# Patient Record
Sex: Female | Born: 1975 | Race: Black or African American | Hispanic: No | Marital: Single | Smoking: Never smoker
Health system: Southern US, Community
[De-identification: ages and names within clinical notes are randomized; demographics above are authoritative.]

## PROBLEM LIST (undated history)

## (undated) DIAGNOSIS — I509 Heart failure, unspecified: Secondary | ICD-10-CM

## (undated) DIAGNOSIS — D649 Anemia, unspecified: Secondary | ICD-10-CM

## (undated) DIAGNOSIS — I10 Essential (primary) hypertension: Secondary | ICD-10-CM

## (undated) HISTORY — DX: Essential (primary) hypertension: I10

## (undated) HISTORY — DX: Heart failure, unspecified: I50.9

## (undated) HISTORY — DX: Anemia, unspecified: D64.9

---

## 1997-07-15 ENCOUNTER — Other Ambulatory Visit: Admission: RE | Admit: 1997-07-15 | Discharge: 1997-07-15 | Payer: Self-pay | Admitting: Obstetrics & Gynecology

## 1997-07-15 ENCOUNTER — Encounter: Admission: RE | Admit: 1997-07-15 | Discharge: 1997-07-15 | Payer: Self-pay | Admitting: Obstetrics & Gynecology

## 1999-11-24 ENCOUNTER — Ambulatory Visit (HOSPITAL_COMMUNITY): Admission: RE | Admit: 1999-11-24 | Discharge: 1999-11-24 | Payer: Self-pay | Admitting: *Deleted

## 2000-03-21 ENCOUNTER — Inpatient Hospital Stay (HOSPITAL_COMMUNITY): Admission: AD | Admit: 2000-03-21 | Discharge: 2000-03-21 | Payer: Self-pay | Admitting: *Deleted

## 2000-04-04 ENCOUNTER — Inpatient Hospital Stay (HOSPITAL_COMMUNITY): Admission: AD | Admit: 2000-04-04 | Discharge: 2000-04-04 | Payer: Self-pay | Admitting: *Deleted

## 2000-04-05 ENCOUNTER — Inpatient Hospital Stay (HOSPITAL_COMMUNITY): Admission: AD | Admit: 2000-04-05 | Discharge: 2000-04-05 | Payer: Self-pay | Admitting: *Deleted

## 2000-04-11 ENCOUNTER — Inpatient Hospital Stay (HOSPITAL_COMMUNITY): Admission: AD | Admit: 2000-04-11 | Discharge: 2000-04-11 | Payer: Self-pay | Admitting: Obstetrics

## 2000-04-19 ENCOUNTER — Inpatient Hospital Stay (HOSPITAL_COMMUNITY): Admission: AD | Admit: 2000-04-19 | Discharge: 2000-04-19 | Payer: Self-pay | Admitting: Obstetrics

## 2000-04-19 ENCOUNTER — Inpatient Hospital Stay (HOSPITAL_COMMUNITY): Admission: AD | Admit: 2000-04-19 | Discharge: 2000-04-19 | Payer: Self-pay | Admitting: *Deleted

## 2000-04-20 ENCOUNTER — Inpatient Hospital Stay (HOSPITAL_COMMUNITY): Admission: AD | Admit: 2000-04-20 | Discharge: 2000-04-25 | Payer: Self-pay | Admitting: Obstetrics & Gynecology

## 2010-08-26 ENCOUNTER — Emergency Department (HOSPITAL_COMMUNITY)
Admission: EM | Admit: 2010-08-26 | Discharge: 2010-08-26 | Disposition: A | Discharge status: Home / Self Care | Payer: Medicaid Other | Attending: Emergency Medicine | Admitting: Emergency Medicine

## 2010-08-26 DIAGNOSIS — K625 Hemorrhage of anus and rectum: Secondary | ICD-10-CM | POA: Insufficient documentation

## 2010-08-26 DIAGNOSIS — R1032 Left lower quadrant pain: Secondary | ICD-10-CM | POA: Insufficient documentation

## 2010-08-26 DIAGNOSIS — I10 Essential (primary) hypertension: Secondary | ICD-10-CM | POA: Insufficient documentation

## 2010-08-26 LAB — URINALYSIS, ROUTINE W REFLEX MICROSCOPIC
Bilirubin Urine: NEGATIVE
Glucose, UA: NEGATIVE mg/dL
Ketones, ur: NEGATIVE mg/dL
Leukocytes, UA: NEGATIVE
Nitrite: NEGATIVE
Protein, ur: NEGATIVE mg/dL
Specific Gravity, Urine: 1.021 (ref 1.005–1.030)
Urobilinogen, UA: 0.2 mg/dL (ref 0.0–1.0)
pH: 5.5 (ref 5.0–8.0)

## 2010-08-26 LAB — CBC
HCT: 34.2 % — ABNORMAL LOW (ref 36.0–46.0)
Hemoglobin: 11.3 g/dL — ABNORMAL LOW (ref 12.0–15.0)
MCH: 26 pg (ref 26.0–34.0)
MCHC: 33 g/dL (ref 30.0–36.0)
MCV: 78.6 fL (ref 78.0–100.0)
Platelets: 428 10*3/uL — ABNORMAL HIGH (ref 150–400)
RBC: 4.35 MIL/uL (ref 3.87–5.11)
RDW: 15.7 % — ABNORMAL HIGH (ref 11.5–15.5)
WBC: 9.7 10*3/uL (ref 4.0–10.5)

## 2010-08-26 LAB — POCT I-STAT, CHEM 8
BUN: 7 mg/dL (ref 6–23)
Calcium, Ion: 1.07 mmol/L — ABNORMAL LOW (ref 1.12–1.32)
Chloride: 103 mEq/L (ref 96–112)
Creatinine, Ser: 0.9 mg/dL (ref 0.4–1.2)
Glucose, Bld: 85 mg/dL (ref 70–99)
HCT: 37 % (ref 36.0–46.0)
Hemoglobin: 12.6 g/dL (ref 12.0–15.0)
Potassium: 2.9 mEq/L — ABNORMAL LOW (ref 3.5–5.1)
Sodium: 140 mEq/L (ref 135–145)
TCO2: 25 mmol/L (ref 0–100)

## 2010-08-26 LAB — URINE MICROSCOPIC-ADD ON

## 2010-08-26 LAB — OCCULT BLOOD, POC DEVICE: Fecal Occult Bld: NEGATIVE

## 2010-08-26 LAB — POCT PREGNANCY, URINE: Preg Test, Ur: NEGATIVE

## 2013-07-15 ENCOUNTER — Ambulatory Visit: Payer: Self-pay | Admitting: Podiatry

## 2013-08-12 ENCOUNTER — Ambulatory Visit: Payer: Self-pay | Admitting: Podiatry

## 2017-05-12 ENCOUNTER — Emergency Department (HOSPITAL_COMMUNITY)
Admission: EM | Admit: 2017-05-12 | Discharge: 2017-05-12 | Disposition: A | Discharge status: Home / Self Care | Payer: Medicaid Other | Attending: Emergency Medicine | Admitting: Emergency Medicine

## 2017-05-12 ENCOUNTER — Encounter (HOSPITAL_COMMUNITY): Payer: Self-pay | Admitting: Emergency Medicine

## 2017-05-12 ENCOUNTER — Other Ambulatory Visit: Payer: Self-pay

## 2017-05-12 DIAGNOSIS — Z659 Problem related to unspecified psychosocial circumstances: Secondary | ICD-10-CM | POA: Diagnosis not present

## 2017-05-12 DIAGNOSIS — D509 Iron deficiency anemia, unspecified: Secondary | ICD-10-CM | POA: Diagnosis present

## 2017-05-12 DIAGNOSIS — R5383 Other fatigue: Secondary | ICD-10-CM

## 2017-05-12 NOTE — ED Triage Notes (Addendum)
Pt verbalizes told by primary doctor that iron was low; recommended medication but pt declined; verbalizes "I do things the natural way." Pt continues to verbalize wanting resources for shelters.

## 2017-05-12 NOTE — Care Management Note (Signed)
Case Management Note  CM consulted for shelter resources.  Will defer to CSW.  No further CM needs noted at this time.

## 2017-05-12 NOTE — Progress Notes (Signed)
CSW met with patient via bedside to discuss current living situation/ discharge plans. Patient states she was previously staying at [facility] in [facility] for 2 months but recently left about 2 weeks about. Patient states she has been staying in [place] however has no permanent living arrangements. Patient stated she has been staying at [facility], hospitals, doctor offices, and multiple shelters. CSW provided patient with local shelter resources and informed patient of shelters with "white flag nights"- these shelters are able to accept more individuals if the temperature drops below 30 degrees. CSW also provided patient with information to the [facility] to help with long term plans. Patient requested bus pass for transportation.   Kingsley Spittle, [facility] Emergency Room Clinical Social Worker [phone]

## 2017-05-12 NOTE — Discharge Instructions (Signed)
Follow-up with your primary care provider regarding low iron, the treatment for this is typically iron supplements or an increase in iron intake in your diet.  If you develop blood in your stool, severe fatigue, weakness, chest pain, palpitations or shortness of breath please return to the emergency department.  You have been provided with a list of shelter resources, these call these places to attempt to find placement.

## 2017-05-12 NOTE — ED Notes (Signed)
ED Provider at bedside. 

## 2017-05-12 NOTE — ED Provider Notes (Signed)
[facility] DEPT Provider Note   CSN: [id number] Arrival date & time: 05/12/17  0546     History   Chief Complaint Chief Complaint  Patient presents with  . Abnormal Lab  . Community Resources    HPI  Kelly Guzman is a 42 y.o. Female who presents to the ED complaining of low iron and requesting resources for shelters. Pt contacted by her PCP this week and told she had low iron, patient denies symptoms with this, no chest pain, shortness of breath, palpitations, no GI bleeding, reports some intermittent weakness or fatigue, none today. Pt reports she is here to see social worker about shelters, but wanted her iron to be checked again while she was here.  Patient reports she has been told she has low iron in the past, does not take iron supplements, reports "she prefers to do things the natural way".  Patient requesting shelter resources, reports she was staying in a shelter and [facility] [employer] the left, and is trying to find a shelter here in [place], reports she was referred by someone to the social worker here in the emergency department.  Patient without any other acute complaints.      History reviewed. No pertinent past medical history.  There are no active problems to display for this patient.   History reviewed. No pertinent surgical history.  OB History    No data available       Home Medications    Prior to Admission medications   Not on File    Family History No family history on file.  Social History Social History   Tobacco Use  . Smoking status: Not on file  Substance Use Topics  . Alcohol use: Not on file  . Drug use: Not on file     Allergies   Patient has no known allergies.   Review of Systems Review of Systems  Constitutional: Positive for fatigue. Negative for chills and fever.  Respiratory: Negative for chest tightness and shortness of breath.   Cardiovascular: Negative for chest pain and  palpitations.  Gastrointestinal: Negative for abdominal pain, blood in stool, nausea and vomiting.  Skin: Negative for pallor.  Neurological: Negative for dizziness, weakness and light-headedness.     Physical Exam Updated Vital Signs BP (!) 166/89 (BP Location: Right Arm)   Pulse 85   Temp 97.7 F (36.5 C) (Oral)   Resp 16   SpO2 100%   Physical Exam  Constitutional: She appears well-developed and well-nourished. No distress.  HENT:  Head: Normocephalic and atraumatic.  Eyes: Right eye exhibits no discharge. Left eye exhibits no discharge.  Cardiovascular: Normal rate, regular rhythm and normal heart sounds.  Pulmonary/Chest: Effort normal and breath sounds normal. No stridor. No respiratory distress. She has no wheezes. She has no rales.  Neurological: She is alert. Coordination normal.  Skin: Skin is warm and dry. Capillary refill takes less than 2 seconds. She is not diaphoretic. No pallor.  Psychiatric: She has a normal mood and affect. Her behavior is normal.  Nursing note and vitals reviewed.    ED Treatments / Results  Labs (all labs ordered are listed, but only abnormal results are displayed) Labs Reviewed - No data to display  EKG  EKG Interpretation None       Radiology No results found.  Procedures Procedures (including critical care time)  Medications Ordered in ED Medications - No data to display   Initial Impression / Assessment and Plan / ED Course  I have reviewed the triage vital signs and the nursing notes.  Pertinent labs & imaging results that were available during my care of the patient were reviewed by me and considered in my medical decision making (see chart for details).  Patient presents reporting she was told she had low iron, initially retesting iron testing, patient is not actively symptomatic with this, denies any GI bleeding, gust with patient that with this is not a lab we typically order from the emergency department she  should follow-up with her primary care doctor regarding this, especially since labs were drawn just earlier this week, she is agreeable to this.  Pt is mildly hypertensive but vitals otherwise normal and patient is overall well-appearing.  Patient requested to see social worker for shelter resources, case management consult placed and patient provided with list of shelters here today.  CSW saw patient here in the ED provided shelter resources and bus pass.  At this time patient is stable for discharge home, she is to follow-up with her primary care doctor regarding iron deficiency as well as her elevated blood pressure today.  Return precautions discussed. At this time there does not appear to be any evidence of an acute emergency medical condition.  Final Clinical Impressions(s) / ED Diagnoses   Final diagnoses:  Iron deficiency anemia, unspecified iron deficiency anemia type  Fatigue, unspecified type  Social problem    ED Discharge Orders    None       Jacqlyn Larsen, [place] 05/12/17 8185    Sherwood Gambler, MD 05/12/17 1047

## 2018-07-13 ENCOUNTER — Telehealth: Payer: Self-pay | Admitting: Family

## 2018-07-13 NOTE — Telephone Encounter (Signed)
lmom for pt to return cal to office to sch new patient appt.. Mailed appt letter for 4/24 at 130 pm

## 2018-08-02 ENCOUNTER — Other Ambulatory Visit: Payer: Self-pay | Admitting: Family

## 2018-08-02 DIAGNOSIS — D649 Anemia, unspecified: Secondary | ICD-10-CM

## 2018-08-03 ENCOUNTER — Other Ambulatory Visit: Payer: Medicaid Other

## 2018-08-03 ENCOUNTER — Inpatient Hospital Stay: Payer: Medicaid Other | Attending: Family | Admitting: Family

## 2018-09-22 ENCOUNTER — Other Ambulatory Visit: Payer: Self-pay | Admitting: *Deleted

## 2018-09-22 DIAGNOSIS — Z20822 Contact with and (suspected) exposure to covid-19: Secondary | ICD-10-CM

## 2018-09-24 NOTE — Addendum Note (Signed)
Addended by: Brigitte Pulse on: 09/24/2018 09:16 AM   Modules accepted: Orders

## 2020-07-08 ENCOUNTER — Telehealth: Payer: Self-pay | Admitting: Hematology and Oncology

## 2020-07-08 NOTE — Telephone Encounter (Signed)
Received a new pt referral from Dr. Jeanie Cooks for IDA. Ms. Kelly Guzman returned my call and has been scheduled to see Dr. Lorenso Courier on 4/4 at 1pm. Pt aware to arrive 20 minutes early.

## 2020-07-10 ENCOUNTER — Other Ambulatory Visit: Payer: Self-pay | Admitting: Internal Medicine

## 2020-07-12 LAB — CBC
HCT: 13.3 % — ABNORMAL LOW (ref 35.0–45.0)
Hemoglobin: 3 g/dL — CL (ref 11.7–15.5)
MCH: <15 pg — ABNORMAL LOW (ref 27.0–33.0)
MCHC: 22.6 g/dL — ABNORMAL LOW (ref 32.0–36.0)
MCV: 55 fL — ABNORMAL LOW (ref 80.0–100.0)
MPV: 9.3 fL (ref 7.5–12.5)
Platelets: 643 10*3/uL — ABNORMAL HIGH (ref 140–400)
RBC: 2.42 10*6/uL — ABNORMAL LOW (ref 3.80–5.10)
RDW: 23.7 % — ABNORMAL HIGH (ref 11.0–15.0)
WBC: 6.4 10*3/uL (ref 3.8–10.8)

## 2020-07-12 LAB — FERRITIN: Ferritin: 2 ng/mL — ABNORMAL LOW (ref 16–232)

## 2020-07-12 LAB — IRON, TOTAL/TOTAL IRON BINDING CAP
%SAT: 1 % (calc) — ABNORMAL LOW (ref 16–45)
Iron: <10 ug/dL — ABNORMAL LOW (ref 40–190)
TIBC: 504 mcg/dL (calc) — ABNORMAL HIGH (ref 250–450)

## 2020-07-12 LAB — SICKLE CELL SCREEN: Sickle Solubility Test - HGBRFX: NEGATIVE

## 2020-07-12 LAB — VITAMIN B12: Vitamin B-12: >2000 pg/mL — ABNORMAL HIGH (ref 200–1100)

## 2020-07-12 LAB — FOLATE: Folate: 13.2 ng/mL

## 2020-07-12 NOTE — Progress Notes (Signed)
[place] [phone]   [phone]  INITIAL CONSULT NOTE  Patient Care Team: Nolene Ebbs, MD as PCP - General (Internal Medicine)  Hematological/Oncological History # Iron Deficiency Anemia 2/2 to GYN Bleeding 04/26/2017: Hgb 4.8, Iron <10, ferritin 1, Iron sat 1%, TIBC 508 07/10/2020: WBC 6.4, Hgb 3.0, MCV 55, Plt 643 07/13/2020: establish care with Dr. Lorenso Courier   CHIEF COMPLAINTS/PURPOSE OF CONSULTATION:  " Iron Deficiency Anemia  "   HISTORY OF PRESENTING ILLNESS:  Kelly Guzman 45 y.o. female with medical history significant for hypertension, and allergic rhinitis who presents for evaluation of iron deficiency anemia.  On review of the previous records Ms. Ordway was [facility].  They provided with his labs from 2019 which showed hemoglobin 4.8, ferritin of 1 iron sat of 1%, TIBC of 508, and a total serum iron of less than 10.  On 07/11/2018 the patient was found to have a white blood cell count of 6.4, hemoglobin 3.0, MCV of 55, and a platelet count of 643.  Due to concern for this iron deficiency anemia the patient was referred to hematology for further evaluation and management.  On exam today Ms. Bey reports that she is "not sure if she is sleepy".  But that she does occasionally have some shortness of breath, particular when walking upstairs in her apartment complex.  She notes that she is not having any nausea, vomiting, or diarrhea, though she does have dark tarry like stools.  She did recently start p.o. iron pills but notes that the stools occurred periodically before that time.  She notes that she does have some soreness around her waist but otherwise no frank abdominal pain.  On further discussion she reports records that she needed a blood transfusion back in 2001 when she lost blood during her delivery.  She is been tried on numerous different types iron pills but is  always had issues with diarrhea and cramping as well as rash.  She notes that her menstrual cycles began becoming much more heavy in 2017.  She notes that most recently she can go through and soak a full pad in approximately 15 minutes time.  Her last menstrual cycle was last week.  She is only been on iron pills for the last 3 days as they were prescribed on 07/10/2020.  In regards to family history the patient notes no hematological disorders within the family.  Her mother has hypertension diabetes and her father has type 2 diabetes.  She has 1 child who is 59 years old otherwise healthy.  She is a never smoker never drinker and is not currently employed.  A full 10 point ROS is listed below.  MEDICAL HISTORY:  Past Medical History:  Diagnosis Date  . Anemia     SURGICAL HISTORY: History reviewed. No pertinent surgical history.  SOCIAL HISTORY: Social History   Socioeconomic History  . Marital status: Single    Spouse name: Not on file  . Number of children: 1  . Years of education: Not on file  . Highest education level: Not on file  Occupational History  . Not on file  Tobacco Use  . Smoking status: Never Smoker  . Smokeless tobacco: Never Used  Substance and Sexual Activity  . Alcohol use: Not on file  . Drug use: Not on file  . Sexual activity: Not on file  Other Topics Concern  . Not on file  Social History Narrative  .  Not on file   Social Determinants of Health   Financial Resource Strain: Not on file  Food Insecurity: Not on file  Transportation Needs: Not on file  Physical Activity: Not on file  Stress: Not on file  Social Connections: Not on file  Intimate Partner Violence: Not on file    FAMILY HISTORY: Family History  Problem Relation Age of Onset  . Diabetes Mother   . Hypertension Mother   . Diabetes Father     ALLERGIES:  is allergic to penicillins.  MEDICATIONS:  Current Outpatient Medications  Medication Sig Dispense Refill  . Iron-FA-B  Cmp-C-Biot-Probiotic (FUSION PLUS PO) Take 1 capsule by mouth daily.    . cetirizine (ZYRTEC) 10 MG tablet SMARTSIG:1 Tablet(s) By Mouth Every Evening PRN    . furosemide (LASIX) 20 MG tablet Take 20 mg by mouth daily as needed.     No current facility-administered medications for this visit.    REVIEW OF SYSTEMS:   Constitutional: ( - ) fevers, ( - )  chills , ( - ) night sweats Eyes: ( - ) blurriness of vision, ( - ) double vision, ( - ) watery eyes Ears, nose, mouth, throat, and face: ( - ) mucositis, ( - ) sore throat Respiratory: ( - ) cough, ( - ) dyspnea, ( - ) wheezes Cardiovascular: ( - ) palpitation, ( - ) chest discomfort, ( - ) lower extremity swelling Gastrointestinal:  ( - ) nausea, ( - ) heartburn, ( - ) change in bowel habits Skin: ( - ) abnormal skin rashes Lymphatics: ( - ) new lymphadenopathy, ( - ) easy bruising Neurological: ( - ) numbness, ( - ) tingling, ( - ) new weaknesses Behavioral/Psych: ( - ) mood change, ( - ) new changes  All other systems were reviewed with the patient and are negative.  PHYSICAL EXAMINATION:  Vitals:   07/13/20 1346  BP: 136/84  Pulse: (!) 123  Resp: 17  Temp: 97.9 F (36.6 C)  SpO2: 93%   Filed Weights   07/13/20 1346  Weight: 140 lb 1.6 oz (63.5 kg)    GENERAL: well appearing pale African-American female in NAD  SKIN: skin color, texture, turgor are normal, no rashes or significant lesions EYES: conjunctiva are pale and non-injected, sclera clear LUNGS: clear to auscultation and percussion with normal breathing effort HEART: regular rate & rhythm and no murmurs and no lower extremity edema Musculoskeletal: no cyanosis of digits and no clubbing  PSYCH: alert & oriented x 3, fluent speech NEURO: no focal motor/sensory deficits  LABORATORY DATA:  I have reviewed the data as listed CBC Latest Ref Rng & Units 07/13/2020 07/10/2020 08/26/2010  WBC 4.0 - 10.5 K/uL 7.9 6.4 -  Hemoglobin 12.0 - 15.0 g/dL PENDING 3.0(LL) 12.6   Hematocrit 36.0 - 46.0 % PENDING 13.3(L) 37.0  Platelets 150 - 400 K/uL 556(H) 643(H) -    CMP Latest Ref Rng & Units 08/26/2010  Glucose 70 - 99 mg/dL 85  BUN 6 - 23 mg/dL 7  Creatinine 0.4 - 1.2 mg/dL 0.90  Sodium 135 - 145 mEq/L 140  Potassium 3.5 - 5.1 mEq/L 2.9(L)  Chloride 96 - 112 mEq/L 103    RADIOGRAPHIC STUDIES: I have personally reviewed the radiological images as listed and agreed with the findings in the report. No results found.  ASSESSMENT & PLAN Cherylin Palm 45 y.o. female with medical history significant for hypertension, and allergic rhinitis who presents for evaluation of iron deficiency anemia.  After review  the labs, the records, schedule the patient the findings most consistent with a severe iron deficiency anemia likely secondary to GYN bleeding with potential GI component given her history of dark stools.  At this time we are recommending that the patient be admitted to the [facility] for emergent transfusion support and beginning evaluation of her severe iron deficiency anemia.  # Iron Deficiency Anemia 2/2 to GYN Bleeding # Possible GI Bleeding --Findings are concerning for an extraordinarily low hemoglobin of 3.0 with clear marked iron deficiency anemia. --When this was uncovered on 07/10/2020 the patient was referred to the emergency department but declined to go at that time. --She is agreeable to be admitted to [facility] long hospital day for urgent transfusions -Target hemoglobin greater than 8.0 at time of discharge.  Recommend by starting with 2 units of packed red blood cells and reassessing. --Encourage primary team to reach out to GI for consult.  More likely to represent GYN bleeding, however given her history of dark stools I would like this to be looked into further --We will plan to set the patient up for IV iron infusions in the outpatient setting when she is discharged from the hospital with close follow-up.  No orders of the  defined types were placed in this encounter.   All questions were answered. The patient knows to call the clinic with any problems, questions or concerns.  A total of more than 60 minutes were spent on this encounter and over half of that time was spent on counseling and coordination of care as outlined above.   Ledell Peoples, MD Department of Hematology/Oncology [place] at [facility] Phone: [phone] Pager: [phone] Email: [email or web address]  07/13/2020 3:37 PM

## 2020-07-13 ENCOUNTER — Encounter: Payer: Self-pay | Admitting: Hematology and Oncology

## 2020-07-13 ENCOUNTER — Other Ambulatory Visit: Payer: Self-pay | Admitting: *Deleted

## 2020-07-13 ENCOUNTER — Inpatient Hospital Stay: Payer: Medicaid Other | Attending: Hematology and Oncology | Admitting: Hematology and Oncology

## 2020-07-13 ENCOUNTER — Inpatient Hospital Stay (HOSPITAL_COMMUNITY)
Admission: AD | Admit: 2020-07-13 | Discharge: 2020-07-18 | DRG: 811 | Disposition: A | Discharge status: Home / Self Care | Payer: Medicaid Other | Source: Ambulatory Visit | Attending: Internal Medicine | Admitting: Internal Medicine

## 2020-07-13 ENCOUNTER — Other Ambulatory Visit: Payer: Self-pay

## 2020-07-13 ENCOUNTER — Encounter (HOSPITAL_COMMUNITY): Payer: Self-pay | Admitting: Internal Medicine

## 2020-07-13 ENCOUNTER — Inpatient Hospital Stay: Payer: Medicaid Other

## 2020-07-13 VITALS — BP 136/84 | HR 123 | Temp 97.9°F | Resp 17 | Wt 140.1 lb

## 2020-07-13 DIAGNOSIS — Z79899 Other long term (current) drug therapy: Secondary | ICD-10-CM

## 2020-07-13 DIAGNOSIS — I517 Cardiomegaly: Secondary | ICD-10-CM | POA: Diagnosis not present

## 2020-07-13 DIAGNOSIS — D649 Anemia, unspecified: Secondary | ICD-10-CM | POA: Diagnosis present

## 2020-07-13 DIAGNOSIS — Z8249 Family history of ischemic heart disease and other diseases of the circulatory system: Secondary | ICD-10-CM

## 2020-07-13 DIAGNOSIS — I110 Hypertensive heart disease with heart failure: Secondary | ICD-10-CM | POA: Diagnosis present

## 2020-07-13 DIAGNOSIS — I313 Pericardial effusion (noninflammatory): Secondary | ICD-10-CM | POA: Diagnosis present

## 2020-07-13 DIAGNOSIS — D259 Leiomyoma of uterus, unspecified: Secondary | ICD-10-CM | POA: Diagnosis present

## 2020-07-13 DIAGNOSIS — I5082 Biventricular heart failure: Secondary | ICD-10-CM | POA: Diagnosis present

## 2020-07-13 DIAGNOSIS — R0600 Dyspnea, unspecified: Secondary | ICD-10-CM

## 2020-07-13 DIAGNOSIS — R600 Localized edema: Secondary | ICD-10-CM | POA: Diagnosis present

## 2020-07-13 DIAGNOSIS — Z20822 Contact with and (suspected) exposure to covid-19: Secondary | ICD-10-CM | POA: Diagnosis present

## 2020-07-13 DIAGNOSIS — I340 Nonrheumatic mitral (valve) insufficiency: Secondary | ICD-10-CM | POA: Diagnosis not present

## 2020-07-13 DIAGNOSIS — N83201 Unspecified ovarian cyst, right side: Secondary | ICD-10-CM | POA: Diagnosis present

## 2020-07-13 DIAGNOSIS — I081 Rheumatic disorders of both mitral and tricuspid valves: Secondary | ICD-10-CM | POA: Diagnosis present

## 2020-07-13 DIAGNOSIS — R160 Hepatomegaly, not elsewhere classified: Secondary | ICD-10-CM | POA: Diagnosis present

## 2020-07-13 DIAGNOSIS — D500 Iron deficiency anemia secondary to blood loss (chronic): Secondary | ICD-10-CM | POA: Diagnosis present

## 2020-07-13 DIAGNOSIS — I5043 Acute on chronic combined systolic (congestive) and diastolic (congestive) heart failure: Secondary | ICD-10-CM | POA: Diagnosis present

## 2020-07-13 DIAGNOSIS — Z880 Allergy status to penicillin: Secondary | ICD-10-CM

## 2020-07-13 DIAGNOSIS — E611 Iron deficiency: Secondary | ICD-10-CM | POA: Diagnosis present

## 2020-07-13 DIAGNOSIS — R000 Tachycardia, unspecified: Secondary | ICD-10-CM | POA: Diagnosis present

## 2020-07-13 DIAGNOSIS — I5032 Chronic diastolic (congestive) heart failure: Secondary | ICD-10-CM

## 2020-07-13 DIAGNOSIS — D509 Iron deficiency anemia, unspecified: Secondary | ICD-10-CM | POA: Insufficient documentation

## 2020-07-13 DIAGNOSIS — E876 Hypokalemia: Secondary | ICD-10-CM | POA: Diagnosis present

## 2020-07-13 DIAGNOSIS — N920 Excessive and frequent menstruation with regular cycle: Secondary | ICD-10-CM | POA: Diagnosis present

## 2020-07-13 DIAGNOSIS — R252 Cramp and spasm: Secondary | ICD-10-CM | POA: Diagnosis not present

## 2020-07-13 DIAGNOSIS — I361 Nonrheumatic tricuspid (valve) insufficiency: Secondary | ICD-10-CM | POA: Diagnosis not present

## 2020-07-13 DIAGNOSIS — I10 Essential (primary) hypertension: Secondary | ICD-10-CM | POA: Insufficient documentation

## 2020-07-13 LAB — CBC WITH DIFFERENTIAL (CANCER CENTER ONLY)
Abs Immature Granulocytes: 0.07 10*3/uL (ref 0.00–0.07)
Basophils Absolute: 0 10*3/uL (ref 0.0–0.1)
Basophils Relative: 0 %
Eosinophils Absolute: 0 10*3/uL (ref 0.0–0.5)
Eosinophils Relative: 0 %
HCT: 14 % — ABNORMAL LOW (ref 36.0–46.0)
Hemoglobin: 3.5 g/dL — CL (ref 12.0–15.0)
Immature Granulocytes: 1 %
Lymphocytes Relative: 22 %
Lymphs Abs: 1.7 10*3/uL (ref 0.7–4.0)
MCH: 13.7 pg — ABNORMAL LOW (ref 26.0–34.0)
MCHC: 25 g/dL — ABNORMAL LOW (ref 30.0–36.0)
MCV: 54.7 fL — ABNORMAL LOW (ref 80.0–100.0)
Monocytes Absolute: 0.5 10*3/uL (ref 0.1–1.0)
Monocytes Relative: 7 %
Neutro Abs: 5.5 10*3/uL (ref 1.7–7.7)
Neutrophils Relative %: 70 %
Platelet Count: 556 10*3/uL — ABNORMAL HIGH (ref 150–400)
RBC: 2.56 MIL/uL — ABNORMAL LOW (ref 3.87–5.11)
RDW: 26 % — ABNORMAL HIGH (ref 11.5–15.5)
WBC Count: 7.9 10*3/uL (ref 4.0–10.5)
nRBC: 1.8 % — ABNORMAL HIGH (ref 0.0–0.2)

## 2020-07-13 LAB — URINALYSIS, ROUTINE W REFLEX MICROSCOPIC
Bacteria, UA: NONE SEEN
Bilirubin Urine: NEGATIVE
Glucose, UA: NEGATIVE mg/dL
Hgb urine dipstick: NEGATIVE
Ketones, ur: NEGATIVE mg/dL
Leukocytes,Ua: NEGATIVE
Nitrite: NEGATIVE
Protein, ur: 30 mg/dL — AB
Specific Gravity, Urine: 1.013 (ref 1.005–1.030)
pH: 6 (ref 5.0–8.0)

## 2020-07-13 LAB — CMP (CANCER CENTER ONLY)
ALT: 24 U/L (ref 0–44)
AST: 26 U/L (ref 15–41)
Albumin: 3.5 g/dL (ref 3.5–5.0)
Alkaline Phosphatase: 216 U/L — ABNORMAL HIGH (ref 38–126)
Anion gap: 13 (ref 5–15)
BUN: 8 mg/dL (ref 6–20)
CO2: 20 mmol/L — ABNORMAL LOW (ref 22–32)
Calcium: 8.1 mg/dL — ABNORMAL LOW (ref 8.9–10.3)
Chloride: 109 mmol/L (ref 98–111)
Creatinine: 0.64 mg/dL (ref 0.44–1.00)
GFR, Estimated: >60 mL/min (ref >60)
Glucose, Bld: 104 mg/dL — ABNORMAL HIGH (ref 70–99)
Potassium: 3 mmol/L — ABNORMAL LOW (ref 3.5–5.1)
Sodium: 142 mmol/L (ref 135–145)
Total Bilirubin: 0.9 mg/dL (ref 0.3–1.2)
Total Protein: 7.1 g/dL (ref 6.5–8.1)

## 2020-07-13 LAB — RESP PANEL BY RT-PCR (FLU A&B, COVID) ARPGX2
Influenza A by PCR: NEGATIVE
Influenza B by PCR: NEGATIVE
SARS Coronavirus 2 by RT PCR: NEGATIVE

## 2020-07-13 LAB — APTT: aPTT: 34 seconds (ref 24–36)

## 2020-07-13 LAB — SAMPLE TO BLOOD BANK

## 2020-07-13 LAB — TSH: TSH: 2.782 u[IU]/mL (ref 0.350–4.500)

## 2020-07-13 LAB — ABO/RH: ABO/RH(D): A POS

## 2020-07-13 LAB — PROTIME-INR
INR: 1.4 — ABNORMAL HIGH (ref 0.8–1.2)
Prothrombin Time: 17 seconds — ABNORMAL HIGH (ref 11.4–15.2)

## 2020-07-13 LAB — PREGNANCY, URINE: Preg Test, Ur: NEGATIVE

## 2020-07-13 LAB — LACTATE DEHYDROGENASE: LDH: 169 U/L (ref 98–192)

## 2020-07-13 MED ORDER — POTASSIUM CHLORIDE CRYS ER 20 MEQ PO TBCR
40.0000 meq | EXTENDED_RELEASE_TABLET | Freq: Once | ORAL | Status: AC
  Administered 2020-07-13: 40 meq via ORAL
  Filled 2020-07-13: qty 2

## 2020-07-13 MED ORDER — SODIUM CHLORIDE 0.9 % IV SOLN: INTRAVENOUS | Status: DC

## 2020-07-13 MED ORDER — FUROSEMIDE 10 MG/ML IJ SOLN
20.0000 mg | Freq: Once | INTRAMUSCULAR | Status: AC
Start: 2020-07-13 — End: 2020-07-13
  Administered 2020-07-13: 20 mg via INTRAVENOUS

## 2020-07-13 MED ORDER — IOHEXOL 9 MG/ML PO SOLN
ORAL | Status: AC
  Administered 2020-07-13: 1000 mL via ORAL
  Filled 2020-07-13: qty 1000

## 2020-07-13 MED ORDER — ONDANSETRON HCL 4 MG PO TABS: 4.0000 mg | ORAL_TABLET | Freq: Four times a day (QID) | ORAL | Status: DC | PRN

## 2020-07-13 MED ORDER — PANTOPRAZOLE SODIUM 40 MG IV SOLR
40.0000 mg | Freq: Two times a day (BID) | INTRAVENOUS | Status: DC
  Administered 2020-07-13 – 2020-07-17 (×7): 40 mg via INTRAVENOUS
  Filled 2020-07-13 (×8): qty 40

## 2020-07-13 MED ORDER — FUROSEMIDE 10 MG/ML IJ SOLN
20.0000 mg | Freq: Once | INTRAMUSCULAR | Status: AC
  Administered 2020-07-14: 20 mg via INTRAVENOUS
  Filled 2020-07-13: qty 2

## 2020-07-13 MED ORDER — SODIUM CHLORIDE 0.9% IV SOLUTION: Freq: Once | INTRAVENOUS | Status: DC

## 2020-07-13 MED ORDER — LIP MEDEX EX OINT
TOPICAL_OINTMENT | CUTANEOUS | Status: DC | PRN
  Filled 2020-07-13: qty 7

## 2020-07-13 MED ORDER — ACETAMINOPHEN 325 MG PO TABS: 650.0000 mg | ORAL_TABLET | Freq: Four times a day (QID) | ORAL | Status: DC | PRN

## 2020-07-13 MED ORDER — ACETAMINOPHEN 650 MG RE SUPP: 650.0000 mg | Freq: Four times a day (QID) | RECTAL | Status: DC | PRN

## 2020-07-13 MED ORDER — ONDANSETRON HCL 4 MG/2ML IJ SOLN: 4.0000 mg | Freq: Four times a day (QID) | INTRAMUSCULAR | Status: DC | PRN

## 2020-07-13 MED ORDER — IOHEXOL 9 MG/ML PO SOLN: 1000.0000 mL | ORAL | Status: AC

## 2020-07-13 NOTE — Plan of Care (Signed)
Initiated care plan 

## 2020-07-13 NOTE — Progress Notes (Signed)
At 3:50 pm pt was transferred to room # 1402 vis wheelchair after report given to Denny Peon, RN via the phone. Pt stable, alert and oriented. She has been given some Sprite and soup and crackers while waiting for a bed assignment.  TCT pt's mother to advise her of her daughter's room #

## 2020-07-13 NOTE — H&P (Addendum)
[facility] History and Physical  Pamula Luther PJA:[id number] DOB: [birth year] DOA: 07/13/2020   PCP: Nolene Ebbs, MD  Specialists: Seen by Dr. Lorenso Courier at the cancer center today.  Chief Complaint: Fatigue, shortness of breath  HPI: Kelly Guzman is a 45 y.o. female with no significant past medical history.  Patient is somewhat of a poor historian.  She tells me that she has been dealing with anemia for a very long time possibly years.  She also seems to think that her hemoglobin has been as low as between 3 and 4 even a few months ago.  No labs available in our system from that time period.  Patient was found to have hemoglobin of 3.0 on 4/1.  This was upon routine blood work by her primary care provider.  She was called and asked to go to the emergency department which she refused.  She was subsequently made an appointment to see hematology today.  She was seen by Dr. Lorenso Courier at the [facility].  She was subsequently sent from there to the hospital for further management.  Patient seems to be providing conflicting information at times.  Apparently she mentioned to Dr. Lorenso Courier that she has had black stools for several days but she told me that she saw dark black stool only this morning and it was normal brown appearing yesterday.  She has been on iron tablets the last few days.  Denies any blood in the stool.  She has had heavy menstrual periods for several years.  She also mentions abdominal pain diffusely.  Some nausea but no vomiting.  Has had some difficulty urinating in the last few days and has had to strain.  Denies any burning sensation with urination.  Denies any weight loss per se but she has had a lot of swelling in her legs and has been on diuretics and so has noticed some fluctuation in her weight as a result of that.  Has noticed significant difficulty breathing with walking.  Also finds it difficult to lay flat in the bed at times due to difficulty breathing.   No fever no chills.  No recent illness otherwise.  She underwent blood work at the cancer center which showed hypokalemia, severe anemia.  LFTs were normal.  Patient was directly hospitalized for further management.  Home Medications: Prior to Admission medications   Medication Sig Start Date End Date Taking? Authorizing Provider  cetirizine (ZYRTEC) 10 MG tablet Take 10 mg by mouth daily. 07/10/20  Yes [provider]  furosemide (LASIX) 20 MG tablet Take 20 mg by mouth daily as needed for fluid. 07/10/20  Yes [provider]  Iron-FA-B Cmp-C-Biot-Probiotic (FUSION PLUS PO) Take 1 capsule by mouth daily.   Yes [provider]    Allergies:  Allergies  Allergen Reactions  . Penicillins Rash    Past Medical History: Past Medical History:  Diagnosis Date  . Anemia     No past surgical history on file.  Social History: She lives in [place].  Does not work.  No history of smoking alcohol use or illicit drug use.  Usually independent with daily activities.   Family History:  Family History  Problem Relation Age of Onset  . Diabetes Mother   . Hypertension Mother   . Diabetes Father      Review of Systems - General ROS: positive for  - fatigue and malaise Psychological ROS: positive for - anxiety Ophthalmic ROS: negative ENT ROS: negative Allergy and Immunology ROS: negative  Hematological and Lymphatic ROS: positive for - bleeding problems and fatigue Endocrine ROS: negative Respiratory ROS: As in HPI Cardiovascular ROS: As in HPI Gastrointestinal ROS: As in HPI Genito-Urinary ROS: As in HPI Musculoskeletal ROS: negative Neurological ROS: no TIA or stroke symptoms Dermatological ROS: negative  Physical Examination  Vitals:   07/13/20 1630  BP: (!) 143/80  Pulse: (!) 111  Resp: 16  Temp: 98.4 F (36.9 C)  TempSrc: Oral  SpO2: 100%    BP (!) 143/80 (BP Location: Left Arm)   Pulse (!) 111   Temp 98.4 F (36.9 C) (Oral)   Resp 16    SpO2 100%   General appearance: alert, cooperative, appears stated age and no distress Head: Normocephalic, without obvious abnormality, atraumatic Eyes: No icterus.  Pallor is noted.  Extraocular movement intact.  Pupils equal reactive. Throat: lips, mucosa, and tongue normal; teeth and gums normal Neck: no adenopathy, no carotid bruit, no JVD, supple, symmetrical, trachea midline and thyroid not enlarged, symmetric, no tenderness/mass/nodules Resp: clear to auscultation bilaterally Cardio: S1 is normal.  S2 is split.  Systolic murmur appreciated over the precordium.  No S3-S4. GI: Abdomen is soft.  Diffusely tender.  Fullness appreciated over the lower abdominal area.  Mild tenderness also appreciated in the right upper quadrant.  Liver edge is palpable.  Bowel sounds present. Extremities: 2+ edema bilateral lower extremities Pulses: 2+ and symmetric Skin: Skin color, texture, turgor normal. No rashes or lesions Lymph nodes: Cervical, supraclavicular, and axillary nodes normal. Neurologic: Alert and oriented x3.  Cranial nerves II through XII intact.  Motor strength equal bilateral upper and lower extremities.    Labs on Admission: I have personally reviewed following labs and imaging studies  CBC: Recent Labs  Lab 07/10/20 1203 07/13/20 1452  WBC 6.4 7.9  NEUTROABS  --  5.5  HGB 3.0* 3.5*  HCT 13.3* 14.0*  MCV 55.0* 54.7*  PLT 643* 202*   Basic Metabolic Panel: Recent Labs  Lab 07/13/20 1452  NA 142  K 3.0*  CL 109  CO2 20*  GLUCOSE 104*  BUN 8  CREATININE 0.64  CALCIUM 8.1*   GFR: CrCl cannot be calculated (Unknown ideal weight.). Liver Function Tests: Recent Labs  Lab 07/13/20 1452  AST 26  ALT 24  ALKPHOS 216*  BILITOT 0.9  PROT 7.1  ALBUMIN 3.5    Radiological Exams on Admission: CT AP is pending  My interpretation of Electrocardiogram: EKG is pending   Problem List  Active Problems:   Symptomatic anemia   Pedal edema   Tachycardia   Iron  deficiency   Menorrhagia   Assessment: This is a 45 year old African-American female who has been hospitalized for further evaluation and management of her severe anemia.  Patient has iron deficiency based on recent blood work.  Hemoglobin is extremely low at 3.5.  This appears to be mostly a chronic issue due to heavy menstruation.  Some concern for dark/black-colored stool.  She also has significant pedal edema.  She also has abdominal tenderness etiology of which is not entirely clear  Plan:  1. Symptomatic anemia/iron deficiency: Most likely due to heavy menstrual loss.  Iron level was less than 10 with a ferritin of 2.  Folic acid level normal.  B12 greater than 2000.  Hemoglobin 3.5 today.  We will start off by transfusing 3 units of PRBC.  She will benefit from iron infusion eventually.  May need more transfusions.  2.  Menorrhagia: Most likely reason for her anemia.  Has not been seen by gynecology in many years.  Do a urine pregnancy test.    3.  Abdominal tenderness: Etiology is unclear.  She also mentions some difficulty urinating recently.  Will check a UA.  At this time it is reasonable to proceed with CT scan of the abdomen pelvis which will be ordered.  4.  Black-colored stool: She mentioned to me that she noticed this only today.  She has been on iron tablets the last few days.  We will do FOBT.  Put her on PPI.  Discuss with gastroenterology in the morning.  No indication for urgent endoscopy at this time.  Denies NSAID use.  Denies any acid reflux.  5.  Pedal edema: Could be due to longstanding anemia.  Will order echocardiogram.  TSH will be checked.  6. Tachycardia: Appears to be regular.  Check EKG.  Likely due to severe anemia.  We will also check TSH.  7.  Hypokalemia: This will be repleted.  Check magnesium level.   DVT Prophylaxis: SCDs Code Status: Full code Family Communication: Discussed with the patient Disposition: Hopefully return home in improved Consults  called: We will discuss with gastroenterology in the morning. Hematology to follow in hospital. Admission Status: Status is: Inpatient  Remains inpatient appropriate because:IV treatments appropriate due to intensity of illness or inability to take PO and Inpatient level of care appropriate due to severity of illness   Dispo: The patient is from: Home              Anticipated d/c is to: Home              Patient currently is not medically stable to d/c.   Difficult to place patient No     Severity of Illness: The appropriate patient status for this patient is INPATIENT. Inpatient status is judged to be reasonable and necessary in order to provide the required intensity of service to ensure the patient's safety. The patient's presenting symptoms, physical exam findings, and initial radiographic and laboratory data in the context of their chronic comorbidities is felt to place them at high risk for further clinical deterioration. Furthermore, it is not anticipated that the patient will be medically stable for discharge from the hospital within 2 midnights of admission. The following factors support the patient status of inpatient.   " The patient's presenting symptoms include fatigue, shortness of breath. " The worrisome physical exam findings include pallor. " The initial radiographic and laboratory data are worrisome because of severe anemia. " The chronic co-morbidities include none.   * I certify that at the point of admission it is my clinical judgment that the patient will require inpatient hospital care spanning beyond 2 midnights from the point of admission due to high intensity of service, high risk for further deterioration and high frequency of surveillance required.*    Further management decisions will depend on results of further testing and patient's response to treatment.   Gresia Isidoro [employer]  [facility] operational officer on [email or web address]  07/13/2020, 5:37 PM

## 2020-07-13 NOTE — Progress Notes (Signed)
MD at bedside. Patient up in chair. Fran Lowes, RN VAST

## 2020-07-13 NOTE — Progress Notes (Signed)
Transported patient to lab via w/c   Lab is to call when she is done and the patient will be brought back to the clinic to wait while arrangements are made for admission.  Pt voices understanding

## 2020-07-14 ENCOUNTER — Inpatient Hospital Stay (HOSPITAL_COMMUNITY): Payer: Medicaid Other

## 2020-07-14 ENCOUNTER — Other Ambulatory Visit: Payer: Self-pay

## 2020-07-14 ENCOUNTER — Encounter (HOSPITAL_COMMUNITY): Payer: Self-pay | Admitting: Internal Medicine

## 2020-07-14 DIAGNOSIS — R0600 Dyspnea, unspecified: Secondary | ICD-10-CM | POA: Diagnosis not present

## 2020-07-14 DIAGNOSIS — R160 Hepatomegaly, not elsewhere classified: Secondary | ICD-10-CM

## 2020-07-14 DIAGNOSIS — I517 Cardiomegaly: Secondary | ICD-10-CM

## 2020-07-14 DIAGNOSIS — I361 Nonrheumatic tricuspid (valve) insufficiency: Secondary | ICD-10-CM

## 2020-07-14 DIAGNOSIS — I340 Nonrheumatic mitral (valve) insufficiency: Secondary | ICD-10-CM

## 2020-07-14 LAB — COMPREHENSIVE METABOLIC PANEL
ALT: 25 U/L (ref 0–44)
AST: 28 U/L (ref 15–41)
Albumin: 3.6 g/dL (ref 3.5–5.0)
Alkaline Phosphatase: 193 U/L — ABNORMAL HIGH (ref 38–126)
Anion gap: 9 (ref 5–15)
BUN: 7 mg/dL (ref 6–20)
CO2: 19 mmol/L — ABNORMAL LOW (ref 22–32)
Calcium: 8.3 mg/dL — ABNORMAL LOW (ref 8.9–10.3)
Chloride: 111 mmol/L (ref 98–111)
Creatinine, Ser: 0.42 mg/dL — ABNORMAL LOW (ref 0.44–1.00)
GFR, Estimated: >60 mL/min (ref >60)
Glucose, Bld: 96 mg/dL (ref 70–99)
Potassium: 3.4 mmol/L — ABNORMAL LOW (ref 3.5–5.1)
Sodium: 139 mmol/L (ref 135–145)
Total Bilirubin: 1.1 mg/dL (ref 0.3–1.2)
Total Protein: 7.3 g/dL (ref 6.5–8.1)

## 2020-07-14 LAB — CBC
HCT: 27.2 % — ABNORMAL LOW (ref 36.0–46.0)
Hemoglobin: 8.2 g/dL — ABNORMAL LOW (ref 12.0–15.0)
MCH: 20.2 pg — ABNORMAL LOW (ref 26.0–34.0)
MCHC: 30.1 g/dL (ref 30.0–36.0)
MCV: 67.2 fL — ABNORMAL LOW (ref 80.0–100.0)
Platelets: 465 10*3/uL — ABNORMAL HIGH (ref 150–400)
RBC: 4.05 MIL/uL (ref 3.87–5.11)
WBC: 8.9 10*3/uL (ref 4.0–10.5)
nRBC: 2.6 % — ABNORMAL HIGH (ref 0.0–0.2)

## 2020-07-14 LAB — ECHOCARDIOGRAM COMPLETE
Area-P 1/2: 4.96 cm2
Calc EF: 37.4 %
Height: 60 in
S' Lateral: 4 cm
Single Plane A2C EF: 34.6 %
Single Plane A4C EF: 38.5 %
Weight: 2241.28 oz

## 2020-07-14 LAB — HIV ANTIBODY (ROUTINE TESTING W REFLEX): HIV Screen 4th Generation wRfx: NONREACTIVE

## 2020-07-14 LAB — OCCULT BLOOD X 1 CARD TO LAB, STOOL: Fecal Occult Bld: NEGATIVE

## 2020-07-14 LAB — BRAIN NATRIURETIC PEPTIDE: B Natriuretic Peptide: 1133.9 pg/mL — ABNORMAL HIGH (ref 0.0–100.0)

## 2020-07-14 LAB — MAGNESIUM: Magnesium: 1.7 mg/dL (ref 1.7–2.4)

## 2020-07-14 LAB — PREPARE RBC (CROSSMATCH)

## 2020-07-14 MED ORDER — FUROSEMIDE 10 MG/ML IJ SOLN
60.0000 mg | Freq: Two times a day (BID) | INTRAMUSCULAR | Status: DC
  Administered 2020-07-14 – 2020-07-15 (×2): 60 mg via INTRAVENOUS
  Filled 2020-07-14 (×3): qty 6

## 2020-07-14 MED ORDER — POTASSIUM CHLORIDE CRYS ER 20 MEQ PO TBCR
40.0000 meq | EXTENDED_RELEASE_TABLET | Freq: Two times a day (BID) | ORAL | Status: AC
  Administered 2020-07-14 (×2): 40 meq via ORAL
  Filled 2020-07-14 (×2): qty 2

## 2020-07-14 MED ORDER — METOPROLOL TARTRATE 5 MG/5ML IV SOLN
5.0000 mg | Freq: Once | INTRAVENOUS | Status: AC
  Administered 2020-07-14: 5 mg via INTRAVENOUS
  Filled 2020-07-14: qty 5

## 2020-07-14 MED ORDER — IOHEXOL 300 MG/ML  SOLN
100.0000 mL | Freq: Once | INTRAMUSCULAR | Status: AC | PRN
  Administered 2020-07-14: 100 mL via INTRAVENOUS

## 2020-07-14 MED ORDER — CETIRIZINE HCL 10 MG PO TABS
10.0000 mg | ORAL_TABLET | Freq: Every day | ORAL | Status: DC
  Administered 2020-07-14 – 2020-07-18 (×5): 10 mg via ORAL
  Filled 2020-07-14 (×5): qty 1

## 2020-07-14 MED ORDER — FUROSEMIDE 10 MG/ML IJ SOLN
INTRAMUSCULAR | Status: AC
  Administered 2020-07-14: 40 mg via INTRAVENOUS
  Filled 2020-07-14: qty 2

## 2020-07-14 MED ORDER — METOPROLOL TARTRATE 5 MG/5ML IV SOLN: 2.5000 mg | Freq: Four times a day (QID) | INTRAVENOUS | Status: DC | PRN

## 2020-07-14 MED ORDER — FUROSEMIDE 10 MG/ML IJ SOLN
40.0000 mg | Freq: Once | INTRAMUSCULAR | Status: AC
  Filled 2020-07-14: qty 4

## 2020-07-14 MED ORDER — LORATADINE 10 MG PO TABS: 10.0000 mg | ORAL_TABLET | Freq: Every day | ORAL | Status: DC

## 2020-07-14 NOTE — Progress Notes (Addendum)
[facility] PROGRESS NOTE   Kelly Guzman HYQ:[id number] DOB: [birth year] DOA: 07/13/2020  PCP: Nolene Ebbs, MD  Brief History/Interval Summary: 45 year old African-American female with no significant past medical history except for longstanding anemia presented with hemoglobin of 3.5.  She had previously declined hospitalization.  Was seen by Dr. Lorenso Courier at the cancer clinic on 4/4 and was directly admitted.  Thought to be secondary to chronic loss from GYN issues.  Consultants: Hematology.  Procedures:  Transthoracic echocardiogram 4/5 1. Left ventricular ejection fraction, by estimation, is 35 to 40%. The  left ventricle has moderately decreased function. The left ventricle  demonstrates global hypokinesis. Indeterminate diastolic filling due to  E-A fusion. There is the  interventricular septum is flattened in diastole ('D' shaped left  ventricle), consistent with right ventricular volume overload.  2. Right ventricular systolic function is moderately reduced. The right  ventricular size is mildly enlarged. There is moderately elevated  pulmonary artery systolic pressure.  3. Left atrial size was mildly dilated.  4. Right atrial size was mild to moderately dilated.  5. The pericardial effusion is circumferential. Large pleural effusion in  the left lateral region.  6. The mitral valve is normal in structure. Mild to moderate mitral valve  regurgitation.  7. Tricuspid valve regurgitation is moderate to severe.  8. The aortic valve is tricuspid. Aortic valve regurgitation is not  visualized.  9. The inferior vena cava is dilated in size with <50% respiratory  variability, suggesting right atrial pressure of 15 mmHg.   Antibiotics: Anti-infectives (From admission, onward)   None      Subjective/Interval History: Patient continues to have fatigue, shortness of breath with exertion.  She also mentions that she is hungry.  Had a brown-colored stool  yesterday.  Has not had any bleeding episodes.  She is getting third unit of PRBC currently.    Assessment/Plan:  Symptomatic anemia/iron deficiency/chronic blood loss anemia Patient's anemia appears to be primarily longstanding and likely due to longstanding menorrhagia.  CT scan of the abdomen pelvis done yesterday showed uterine fibroids which could be the reason for her menorrhagia.  She will need to eventually see a GYN.  No active bleeding noted currently.  Patient was ordered 3 units of PRBC yesterday.  She is currently getting her third unit.  Wait for posttransfusion CBC.  Will like to keep her hemoglobin close to 7. Anemia panel done a few days ago showed a ferritin of 2, iron level less than 10.  Folic acid level was normal.  B12 level greater than 2000.  May benefit from iron infusion during this hospital stay.  Menorrhagia/uterine fibroids No active bleeding currently.  This has been a longstanding issue.  CT scan showed uterine fibroids.  She will need to see GYN eventually.  Urine pregnancy test was negative.  Concern for GI bleed Patient mention black-colored stool yesterday to hematology and to self.  She only had one episode of this and that was yesterday.  She had been on iron tablets for the last week.  Had a brown stool yesterday.  Fecal occult test was negative.  No clear indication at this time to involve gastroenterology.  Continue PPI for now.  Dyspnea/pedal edema/cardiomegaly/pleural effusion/small pericardial effusion Patient's dyspnea thought to be due to severe anemia.  However CT scan raises concern for cardiomegaly.  She is also noted to have moderate bilateral pleural effusion.  She has significant pedal edema bilaterally.  She is likely volume overloaded.  Stop IVF. She is getting  Lasix in between transfusions.   Echocardiogram is pending.  Will check EKG.  We will also check BNP.  Consider more doses of diuretics later today. Chest x-ray will also be ordered. TSH  was normal.  ADDENDUM ECHO shows low EF of 35-40%. RV function is reduced as well. Pericardial effusion noted. Discussed with Dr. Johney Frame with cardiology. They will consult. She reviewed ECHO films and thinks pericardial effusion is small. Will give additional Lasix tonight.   Abdominal discomfort/hepatomegaly CT scan of the abdomen pelvis did show small amount of free fluid in the abdomen and pelvis with mesenteric edema.  Thought to be reactive or related to liver disease.  She was also noted to have significant hepatomegaly.  Passive hepatic congestion could be the reason.  Her AST ALT were normal yesterday.  Viral etiology seems less likely.  Follow-up again on LFTs today. UA did not suggest infection.  LFTs were normal.  Pancreas looked unremarkable on CT scan.  Elevated blood pressure/sinus tachycardia Blood pressure noted to be in the hypertensive range.  No previous history of hypertension.  Follow-up on echocardiogram and labs and then will decide on treatment options.  Hypokalemia This was repleted yesterday.  Recheck labs.  Check magnesium.  Right adnexal cyst Incidentally noted on CT scan.  Outpatient management.  DVT Prophylaxis: SCDs Code Status: Full code Family Communication: Discussed with the patient Disposition Plan: Hopefully return home when improved  Status is: Inpatient  Remains inpatient appropriate because:Ongoing diagnostic testing needed not appropriate for outpatient work up, IV treatments appropriate due to intensity of illness or inability to take PO and Inpatient level of care appropriate due to severity of illness   Dispo:  Patient From: Home  Planned Disposition: Home  Medically stable for discharge: No        Medications:  Scheduled: . sodium chloride   Intravenous Once  . furosemide      . pantoprazole (PROTONIX) IV  40 mg Intravenous Q12H   Continuous: . sodium chloride 75 mL/hr at 07/13/20 1808   MLY:[id number] **OR**  acetaminophen, lip balm, ondansetron **OR** ondansetron (ZOFRAN) IV   Objective:  Vital Signs  Vitals:   07/14/20 0156 07/14/20 0219 07/14/20 0510 07/14/20 0534  BP: (!) 147/101 (!) 151/111 (!) 151/109 (!) 153/109  Pulse: (!) 108 (!) 109 (!) 106 (!) 102  Resp: (!) 21 (!) 21 (!) 21 (!) 21  Temp: 98.6 F (37 C) 98.4 F (36.9 C) 98.5 F (36.9 C) 97.9 F (36.6 C)  TempSrc: Oral Oral Oral Oral  SpO2: 100% 100% 100% 100%  Weight:      Height:        Intake/Output Summary (Last 24 hours) at 07/14/2020 0901 Last data filed at 07/14/2020 0507 Gross per 24 hour  Intake 1257.97 ml  Output --  Net 1257.97 ml   Filed Weights   07/13/20 2333  Weight: 63.5 kg    General appearance: Awake alert.  In no distress Resp: Diminished air entry at the bases.  No wheezing or rhonchi. Cardio: S1 is normal.  S2 may be split.  Possible S3.  Systolic murmur over the precordium.   GI: Abdomen is soft.  Hepatomegaly appreciated.  Mildly tender diffusely without any rebound rigidity or guarding. Extremities: 2+ edema bilateral lower extremities Neurologic: Alert and oriented x3.  No focal neurological deficits.    Lab Results:  Data Reviewed: I have personally reviewed following labs and imaging studies  CBC: Recent Labs  Lab 07/10/20 1203 07/13/20 1452  WBC 6.4  7.9  NEUTROABS  --  5.5  HGB 3.0* 3.5*  HCT 13.3* 14.0*  MCV 55.0* 54.7*  PLT 643* 556*    Basic Metabolic Panel: Recent Labs  Lab 07/13/20 1452  NA 142  K 3.0*  CL 109  CO2 20*  GLUCOSE 104*  BUN 8  CREATININE 0.64  CALCIUM 8.1*    GFR: Estimated Creatinine Clearance: 74.7 mL/min (by C-G formula based on SCr of 0.64 mg/dL).  Liver Function Tests: Recent Labs  Lab 07/13/20 1452  AST 26  ALT 24  ALKPHOS 216*  BILITOT 0.9  PROT 7.1  ALBUMIN 3.5     Coagulation Profile: Recent Labs  Lab 07/13/20 1756  INR 1.4*     Thyroid Function Tests: Recent Labs    07/13/20 1756  TSH 2.782      Recent  Results (from the past 240 hour(s))  Resp Panel by RT-PCR (Flu A&B, Covid) Nasopharyngeal Swab     Status: None   Collection Time: 07/13/20  5:27 PM   Specimen: Nasopharyngeal Swab; Nasopharyngeal(NP) swabs in vial transport medium  Result Value Ref Range Status   SARS Coronavirus 2 by RT PCR NEGATIVE NEGATIVE Final    Comment: (NOTE) SARS-CoV-2 target nucleic acids are NOT DETECTED.  The SARS-CoV-2 RNA is generally detectable in upper respiratory specimens during the acute phase of infection. The lowest concentration of SARS-CoV-2 viral copies this assay can detect is 138 copies/mL. A negative result does not preclude SARS-Cov-2 infection and should not be used as the sole basis for treatment or other patient management decisions. A negative result may occur with  improper specimen collection/handling, submission of specimen other than nasopharyngeal swab, presence of viral mutation(s) within the areas targeted by this assay, and inadequate number of viral copies(<138 copies/mL). A negative result must be combined with clinical observations, patient history, and epidemiological information. The expected result is Negative.  Fact Sheet for Patients:  EntrepreneurPulse.com.au  Fact Sheet for Healthcare Providers:  IncredibleEmployment.be  This test is no t yet approved or cleared by the [place] FDA and  has been authorized for detection and/or diagnosis of SARS-CoV-2 by FDA under an Emergency Use Authorization (EUA). This EUA will remain  in effect (meaning this test can be used) for the duration of the COVID-19 declaration under Section 564(b)(1) of the Act, 21 U.S.C.section 360bbb-3(b)(1), unless the authorization is terminated  or revoked sooner.       Influenza A by PCR NEGATIVE NEGATIVE Final   Influenza B by PCR NEGATIVE NEGATIVE Final    Comment: (NOTE) The Xpert Xpress SARS-CoV-2/FLU/RSV plus assay is intended as an aid in the  diagnosis of influenza from Nasopharyngeal swab specimens and should not be used as a sole basis for treatment. Nasal washings and aspirates are unacceptable for Xpert Xpress SARS-CoV-2/FLU/RSV testing.  Fact Sheet for Patients: EntrepreneurPulse.com.au  Fact Sheet for Healthcare Providers: IncredibleEmployment.be  This test is not yet approved or cleared by the [place] FDA and has been authorized for detection and/or diagnosis of SARS-CoV-2 by FDA under an Emergency Use Authorization (EUA). This EUA will remain in effect (meaning this test can be used) for the duration of the COVID-19 declaration under Section 564(b)(1) of the Act, 21 U.S.C. section 360bbb-3(b)(1), unless the authorization is terminated or revoked.  Performed at [facility], [place] [address] [place], [place] [address]       Radiology Studies: CT ABDOMEN PELVIS W CONTRAST  Result Date: 07/14/2020 CLINICAL DATA:  Nausea and diffuse abdominal pain.  EXAM: CT ABDOMEN AND PELVIS WITH CONTRAST TECHNIQUE: Multidetector CT imaging of the abdomen and pelvis was performed using the standard protocol following bolus administration of intravenous contrast. CONTRAST:  179mL OMNIPAQUE IOHEXOL 300 MG/ML  SOLN COMPARISON:  None. FINDINGS: Lower chest: Moderate bilateral pleural effusions with basilar atelectasis. Cardiac enlargement with small pericardial effusion. Hepatobiliary: Diffuse enlargement of the liver. Heterogeneous poorly defined low-attenuation throughout the liver. Changes may represent a combination of passive hepatic congestion and heterogeneous fatty infiltration. Pericholecystic fluid is nonspecific but likely related to liver disease. No bile duct dilatation. Pancreas: Unremarkable. No pancreatic ductal dilatation or surrounding inflammatory changes. Spleen: Normal in size without focal abnormality. Adrenals/Urinary Tract: Adrenal glands are unremarkable.  Kidneys are normal, without renal calculi, focal lesion, or hydronephrosis. Bladder is unremarkable. Stomach/Bowel: Stomach, small bowel, and colon are not abnormally distended. No wall thickening or inflammatory changes are appreciated. Appendix is normal. Vascular/Lymphatic: No significant vascular findings are present. No enlarged abdominal or pelvic lymph nodes. Reproductive: Heterogeneous nodular enlargement of the uterus consistent with uterine fibroids. Largest measures 3.8 cm diameter. Right adnexal cyst measuring 5 cm diameter. Other: Small amount of free fluid in the abdomen and pelvis with mesenteric edema, likely reactive or related to liver disease. Musculoskeletal: No acute or significant osseous findings. IMPRESSION: 1. Diffuse enlargement of the liver with poorly defined low-attenuation changes, likely to represent a combination of passive hepatic congestion and heterogeneous fatty infiltration. Hepatitis would also be a possibility. 2. Moderate bilateral pleural effusions with basilar atelectasis. 3. Cardiac enlargement with small pericardial effusion. 4. Small amount of free fluid in the abdomen and pelvis with mesenteric edema, likely reactive or related to liver disease. 5. Uterine fibroids. 6. Right adnexal cyst measuring 5 cm diameter. No follow-up imaging recommended. Note: This recommendation does not apply to premenarchal patients and to those with increased risk (genetic, family history, elevated tumor markers or other high-risk factors) of ovarian cancer. Reference: JACR 2020 Feb; 17(2):248-254 Electronically Signed   By: Lucienne Capers M.D.   On: 07/14/2020 02:03       LOS: 1 day   [facility] Pager on [email or web address]  07/14/2020, 9:01 AM

## 2020-07-14 NOTE — Progress Notes (Signed)
  Echocardiogram 2D Echocardiogram has been performed.  Jennette Dubin 07/14/2020, 2:50 PM

## 2020-07-14 NOTE — Plan of Care (Signed)

## 2020-07-15 ENCOUNTER — Encounter (HOSPITAL_COMMUNITY): Payer: Self-pay | Admitting: Internal Medicine

## 2020-07-15 DIAGNOSIS — I5043 Acute on chronic combined systolic (congestive) and diastolic (congestive) heart failure: Secondary | ICD-10-CM | POA: Diagnosis not present

## 2020-07-15 DIAGNOSIS — R000 Tachycardia, unspecified: Secondary | ICD-10-CM

## 2020-07-15 DIAGNOSIS — E611 Iron deficiency: Secondary | ICD-10-CM

## 2020-07-15 LAB — TYPE AND SCREEN
ABO/RH(D): A POS
Antibody Screen: NEGATIVE
Unit division: 0
Unit division: 0
Unit division: 0

## 2020-07-15 LAB — COMPREHENSIVE METABOLIC PANEL
ALT: 24 U/L (ref 0–44)
AST: 24 U/L (ref 15–41)
Albumin: 3.4 g/dL — ABNORMAL LOW (ref 3.5–5.0)
Alkaline Phosphatase: 173 U/L — ABNORMAL HIGH (ref 38–126)
Anion gap: 11 (ref 5–15)
BUN: 10 mg/dL (ref 6–20)
CO2: 23 mmol/L (ref 22–32)
Calcium: 8.6 mg/dL — ABNORMAL LOW (ref 8.9–10.3)
Chloride: 108 mmol/L (ref 98–111)
Creatinine, Ser: 0.68 mg/dL (ref 0.44–1.00)
GFR, Estimated: >60 mL/min (ref >60)
Glucose, Bld: 105 mg/dL — ABNORMAL HIGH (ref 70–99)
Potassium: 3.4 mmol/L — ABNORMAL LOW (ref 3.5–5.1)
Sodium: 142 mmol/L (ref 135–145)
Total Bilirubin: 1.2 mg/dL (ref 0.3–1.2)
Total Protein: 7.1 g/dL (ref 6.5–8.1)

## 2020-07-15 LAB — BPAM RBC
Blood Product Expiration Date: 202204252359
Blood Product Expiration Date: 202204252359
Blood Product Expiration Date: 202204252359
ISSUE DATE / TIME: 202204042100
ISSUE DATE / TIME: 202204050155
ISSUE DATE / TIME: 202204050509
Unit Type and Rh: 6200
Unit Type and Rh: 6200
Unit Type and Rh: 6200

## 2020-07-15 LAB — CBC
HCT: 27.8 % — ABNORMAL LOW (ref 36.0–46.0)
Hemoglobin: 8.2 g/dL — ABNORMAL LOW (ref 12.0–15.0)
MCH: 20 pg — ABNORMAL LOW (ref 26.0–34.0)
MCHC: 29.5 g/dL — ABNORMAL LOW (ref 30.0–36.0)
MCV: 68 fL — ABNORMAL LOW (ref 80.0–100.0)
Platelets: 427 10*3/uL — ABNORMAL HIGH (ref 150–400)
RBC: 4.09 MIL/uL (ref 3.87–5.11)
RDW: 33.1 % — ABNORMAL HIGH (ref 11.5–15.5)
WBC: 8.5 10*3/uL (ref 4.0–10.5)
nRBC: 1.9 % — ABNORMAL HIGH (ref 0.0–0.2)

## 2020-07-15 LAB — MAGNESIUM: Magnesium: 1.7 mg/dL (ref 1.7–2.4)

## 2020-07-15 MED ORDER — SACUBITRIL-VALSARTAN 24-26 MG PO TABS
1.0000 | ORAL_TABLET | Freq: Two times a day (BID) | ORAL | Status: DC
  Administered 2020-07-15 – 2020-07-16 (×3): 1 via ORAL
  Filled 2020-07-15 (×3): qty 1

## 2020-07-15 MED ORDER — FUROSEMIDE 10 MG/ML IJ SOLN
60.0000 mg | Freq: Two times a day (BID) | INTRAMUSCULAR | Status: DC
  Administered 2020-07-16 – 2020-07-17 (×3): 60 mg via INTRAVENOUS
  Filled 2020-07-15 (×3): qty 6

## 2020-07-15 MED ORDER — MAGNESIUM SULFATE 2 GM/50ML IV SOLN
2.0000 g | Freq: Once | INTRAVENOUS | Status: AC
  Administered 2020-07-15: 2 g via INTRAVENOUS
  Filled 2020-07-15: qty 50

## 2020-07-15 MED ORDER — CARVEDILOL 3.125 MG PO TABS
3.1250 mg | ORAL_TABLET | Freq: Two times a day (BID) | ORAL | Status: DC
  Administered 2020-07-15 – 2020-07-16 (×3): 3.125 mg via ORAL
  Filled 2020-07-15 (×3): qty 1

## 2020-07-15 MED ORDER — POTASSIUM CHLORIDE CRYS ER 20 MEQ PO TBCR
60.0000 meq | EXTENDED_RELEASE_TABLET | Freq: Once | ORAL | Status: AC
  Administered 2020-07-15: 60 meq via ORAL
  Filled 2020-07-15: qty 3

## 2020-07-15 NOTE — Progress Notes (Addendum)
Patient was in yellow mews only because she had just went to the [facility]. She has dyspnea with exertion. NT rechecked VSs. Will continue to monitor.

## 2020-07-15 NOTE — Consult Note (Addendum)
Cardiology Consultation:   Patient ID: Kelly Guzman MRN: [id number]; DOB: [birth year]  Admit date: 07/13/2020 Date of Consult: 07/15/2020  PCP:  Kelly Ebbs, MD   [place]  Cardiologist:  New to [facility] Kelly Guzman Advanced Practice Provider:  No care team member to display Electrophysiologist:  None    Patient Profile:   Kelly Guzman is a 45 y.o. female with a hx of chronic iron deficiency anemia, elevated blood pressure and history of preeclampsia who is being seen today for the evaluation of abnormal echo at the request of Kelly Guzman.  History of Present Illness:   Kelly Guzman is a 45 year old female with past medical history of chronic iron deficiency anemia, elevated blood pressure and history of preeclampsia.  She has never seen a cardiologist in the past nor was she aware of any heart issues such as heart murmur or CAD.  She was told when she gave birth to her daughter 20 years ago, she had preeclampsia at the time.  Several years ago, she had elevated blood pressure in the 160s to 170s, she tried multiple medication however due to side effect, she had to discontinue all of them.  Blood pressure later normalized by itself.  She is very mindful of her health and typically eat low-salt diet only.  Per patient, she has been dealing with anemia for over a year.  She has been noticing progressive dyspnea.  In the beginning, she also noticed some burning sensation in the chest with walking.  This chest discomfort has stopped.  In the past 7 months, her dyspnea on exertion has worsened to the point where she began to have dyspnea with minimal activity.  She also noticed pitting edema in bilateral lower extremity.  While leg elevation improved edema, it does not completely make it go away.    She has followed with Kelly Guzman of hematology/oncology service for iron deficiency anemia that was felt to be due to menorrhagia.  Hemoglobin was as low as 3.0 on  07/10/2020.  Vitamin B12 greater than 2000.  Iron less than 7, TIBC 504.  She was seen by hematology service and sent to [facility] for blood transfusion.  Patient was seen initially in sinus tachycardia with heart rate in the 110s to 120s on arrival.  TSH was normal.  She was immediately transfused 2 units of packed red blood cell.  CT of abdomen pelvis showed diffuse diffusely enlargement of liver likely due to passive hepatic congestion and fatty infiltration, moderate bilateral pleural effusion, cardiac enlargement with small pericardial effusion, small amount of free fluid in the abdomen and pelvis likely reactive to liver disease, uterine fibroids, right adnexal cyst measuring 5 cm in diameter.  Stool Hemoccult was negative.  After multiple blood transfusion, hemoglobin went up to 8.2.  BNP obtained on 07/14/2020 was 1133.  Chest x-ray showed small bilateral pleural effusion with bibasilar atelectasis and infiltrate consistent with CHF.  Echocardiogram obtained on 07/13/2020 showed EF 35 to 40%, interventricular septum flattening in diastole consistent with right ventricular volume overload, moderately reduced RVEF, moderately elevated PA peak pressure, mild to moderate right atrial enlargement, trivial pericardial effusion with large left pleural effusion, mild to moderate MR, moderate to severe TR.   Past Medical History:  Diagnosis Date  . Anemia     Past Surgical History:  Procedure Laterality Date  . CESAREAN SECTION     gave birth to her daughter around 2002     Home Medications:  Prior to Admission medications   Medication Sig Start Date End Date Taking? Authorizing Provider  cetirizine (ZYRTEC) 10 MG tablet Take 10 mg by mouth daily. 07/10/20  Yes [provider]  furosemide (LASIX) 20 MG tablet Take 20 mg by mouth daily as needed for fluid. 07/10/20  Yes [provider]  Iron-FA-B Cmp-C-Biot-Probiotic (FUSION PLUS PO) Take 1 capsule by mouth daily.   Yes [provider]    Inpatient Medications: Scheduled Meds: . sodium chloride   Intravenous Once  . cetirizine  10 mg Oral Daily  . furosemide  60 mg Intravenous Q12H  . pantoprazole (PROTONIX) IV  40 mg Intravenous Q12H   Continuous Infusions:  PRN Meds: acetaminophen **OR** acetaminophen, lip balm, metoprolol tartrate, ondansetron **OR** ondansetron (ZOFRAN) IV  Allergies:    Allergies  Allergen Reactions  . Penicillins Rash    Social History:   Social History   Socioeconomic History  . Marital status: Single    Spouse name: Not on file  . Number of children: 1  . Years of education: Not on file  . Highest education level: Not on file  Occupational History  . Not on file  Tobacco Use  . Smoking status: Never Smoker  . Smokeless tobacco: Never Used  Vaping Use  . Vaping Use: Never used  Substance and Sexual Activity  . Alcohol use: Never  . Drug use: Never  . Sexual activity: Not on file  Other Topics Concern  . Not on file  Social History Narrative  . Not on file   Social Determinants of Health   Financial Resource Strain: Not on file  Food Insecurity: Not on file  Transportation Needs: Not on file  Physical Activity: Not on file  Stress: Not on file  Social Connections: Not on file  Intimate Partner Violence: Not on file    Family History:    Family History  Problem Relation Age of Onset  . Diabetes Mother   . Hypertension Mother   . Diabetes Father   . Heart failure Father   . Heart failure Paternal Grandfather      ROS:  Please see the history of present illness.   All other ROS reviewed and negative.     Physical Exam/Data:   Vitals:   07/14/20 2046 07/15/20 0211 07/15/20 0500 07/15/20 0613  BP: (!) 143/104 (!) 155/109  (!) 141/111  Pulse: (!) 109 (!) 103  100  Resp: 20 18  18   Temp: 98.7 F (37.1 C) 97.9 F (36.6 C)  97.8 F (36.6 C)  TempSrc: Oral     SpO2: 100% 100%  100%  Weight:   63.5 kg   Height:        Intake/Output  Summary (Last 24 hours) at 07/15/2020 0908 Last data filed at 07/14/2020 2000 Gross per 24 hour  Intake 1161.82 ml  Output --  Net 1161.82 ml   Last 3 Weights 07/15/2020 07/13/2020 07/13/2020  Weight (lbs) 139 lb 15.9 oz 140 lb 1.3 oz 140 lb 1.6 oz  Weight (kg) 63.5 kg 63.54 kg 63.549 kg     Body mass index is 27.34 kg/m.  General:  Well nourished, well developed, in no acute distress HEENT: normal Lymph: no adenopathy Neck: no JVD Endocrine:  No thryomegaly Vascular: No carotid bruits; FA pulses 2+ bilaterally without bruits  Cardiac:  normal S1, S2; RRR;  + S3 gallop  Lungs:  clear to auscultation bilaterally, no wheezing, rhonchi or rales  Abd: soft, nontender, no hepatomegaly  Ext: no edema Musculoskeletal:  No deformities, BUE and BLE strength normal and equal Skin: warm and dry  Neuro:  CNs 2-12 intact, no focal abnormalities noted Psych:  Normal affect   EKG:  The EKG was personally reviewed and demonstrates:  Sinus tachycardia with poor R wave progression in the anterior leads  Telemetry:  Telemetry was personally reviewed and demonstrates:  Sinus tachycardia with HR 110-120s, single episode of 12 beat run of SVT in the morning of 4/5  Relevant CV Studies:  Echo 07/14/2020 1. Left ventricular ejection fraction, by estimation, is 35 to 40%. The  left ventricle has moderately decreased function. The left ventricle  demonstrates global hypokinesis. Indeterminate diastolic filling due to  E-A fusion. There is the  interventricular septum is flattened in diastole ('D' shaped left  ventricle), consistent with right ventricular volume overload.  2. Right ventricular systolic function is moderately reduced. The right  ventricular size is mildly enlarged. There is moderately elevated  pulmonary artery systolic pressure.  3. Left atrial size was mildly dilated.  4. Right atrial size was mild to moderately dilated.  5. The pericardial effusion is circumferential. Large pleural  effusion in  the left lateral region.  6. The mitral valve is normal in structure. Mild to moderate mitral valve  regurgitation.  7. Tricuspid valve regurgitation is moderate to severe.  8. The aortic valve is tricuspid. Aortic valve regurgitation is not  visualized.  9. The inferior vena cava is dilated in size with <50% respiratory  variability, suggesting right atrial pressure of 15 mmHg.   Laboratory Data:  High Sensitivity Troponin:  No results for input(s): TROPONINIHS in the last 720 hours.   Chemistry Recent Labs  Lab 07/13/20 1452 07/14/20 1211 07/15/20 0430  NA 142 139 142  K 3.0* 3.4* 3.4*  CL 109 111 108  CO2 20* 19* 23  GLUCOSE 104* 96 105*  BUN 8 7 10   CREATININE 0.64 0.42* 0.68  CALCIUM 8.1* 8.3* 8.6*  GFRNONAA >60 >60 >60  ANIONGAP 13 9 11     Recent Labs  Lab 07/13/20 1452 07/14/20 1211 07/15/20 0430  PROT 7.1 7.3 7.1  ALBUMIN 3.5 3.6 3.4*  AST 26 28 24   ALT 24 25 24   ALKPHOS 216* 193* 173*  BILITOT 0.9 1.1 1.2   Hematology Recent Labs  Lab 07/13/20 1452 07/14/20 1211 07/15/20 0430  WBC 7.9 8.9 8.5  RBC 2.56* 4.05 4.09  HGB 3.5* 8.2* 8.2*  HCT 14.0* 27.2* 27.8*  MCV 54.7* 67.2* 68.0*  MCH 13.7* 20.2* 20.0*  MCHC 25.0* 30.1 29.5*  RDW 26.0* Not Measured 33.1*  PLT 556* 465* 427*   BNP Recent Labs  Lab 07/14/20 1211  BNP 1,133.9*    DDimer No results for input(s): DDIMER in the last 168 hours.   Radiology/Studies:  DG Chest 2 View  Result Date: 07/14/2020 CLINICAL DATA:  Shortness of breath. EXAM: CHEST - 2 VIEW COMPARISON:  No prior. FINDINGS: Cardiomegaly. Bibasilar atelectasis and infiltrates/edema. Small bilateral pleural effusions. Findings most consistent with CHF. Bibasilar pneumonia cannot be excluded. No pneumothorax. IMPRESSION: Cardiomegaly. Bibasilar atelectasis and infiltrates/edema. Small bilateral pleural effusions. Findings most consistent CHF. Electronically Signed   By: Marcello Moores  Register   On: 07/14/2020 12:44    CT ABDOMEN PELVIS W CONTRAST  Result Date: 07/14/2020 CLINICAL DATA:  Nausea and diffuse abdominal pain. EXAM: CT ABDOMEN AND PELVIS WITH CONTRAST TECHNIQUE: Multidetector CT imaging of the abdomen and pelvis was performed using the standard protocol following bolus administration of intravenous  contrast. CONTRAST:  139mL OMNIPAQUE IOHEXOL 300 MG/ML  SOLN COMPARISON:  None. FINDINGS: Lower chest: Moderate bilateral pleural effusions with basilar atelectasis. Cardiac enlargement with small pericardial effusion. Hepatobiliary: Diffuse enlargement of the liver. Heterogeneous poorly defined low-attenuation throughout the liver. Changes may represent a combination of passive hepatic congestion and heterogeneous fatty infiltration. Pericholecystic fluid is nonspecific but likely related to liver disease. No bile duct dilatation. Pancreas: Unremarkable. No pancreatic ductal dilatation or surrounding inflammatory changes. Spleen: Normal in size without focal abnormality. Adrenals/Urinary Tract: Adrenal glands are unremarkable. Kidneys are normal, without renal calculi, focal lesion, or hydronephrosis. Bladder is unremarkable. Stomach/Bowel: Stomach, small bowel, and colon are not abnormally distended. No wall thickening or inflammatory changes are appreciated. Appendix is normal. Vascular/Lymphatic: No significant vascular findings are present. No enlarged abdominal or pelvic lymph nodes. Reproductive: Heterogeneous nodular enlargement of the uterus consistent with uterine fibroids. Largest measures 3.8 cm diameter. Right adnexal cyst measuring 5 cm diameter. Other: Small amount of free fluid in the abdomen and pelvis with mesenteric edema, likely reactive or related to liver disease. Musculoskeletal: No acute or significant osseous findings. IMPRESSION: 1. Diffuse enlargement of the liver with poorly defined low-attenuation changes, likely to represent a combination of passive hepatic congestion and heterogeneous  fatty infiltration. Hepatitis would also be a possibility. 2. Moderate bilateral pleural effusions with basilar atelectasis. 3. Cardiac enlargement with small pericardial effusion. 4. Small amount of free fluid in the abdomen and pelvis with mesenteric edema, likely reactive or related to liver disease. 5. Uterine fibroids. 6. Right adnexal cyst measuring 5 cm diameter. No follow-up imaging recommended. Note: This recommendation does not apply to premenarchal patients and to those with increased risk (genetic, family history, elevated tumor markers or other high-risk factors) of ovarian cancer. Reference: JACR 2020 Feb; 17(2):248-254 Electronically Signed   By: Lucienne Capers M.D.   On: 07/14/2020 02:03   ECHOCARDIOGRAM COMPLETE  Result Date: 07/14/2020    ECHOCARDIOGRAM REPORT   Patient Name:   YULA CROTWELL Date of Exam: 07/14/2020 Medical Rec #:  [id number]          Height:       60.0 in Accession #:    [id number]         Weight:       140.1 lb Date of Birth:  08/31/1975          BSA:          1.604 m Patient Age:    52 years           BP:           155/111 mmHg Patient Gender: F                  HR:           118 bpm. Exam Location:  Inpatient Procedure: 2D Echo Indications:    Dyspnea R06.00  History:        Patient has no prior history of Echocardiogram examinations.  Sonographer:    Mikki Santee RDCS (AE) Referring Phys: [place]  1. Left ventricular ejection fraction, by estimation, is 35 to 40%. The left ventricle has moderately decreased function. The left ventricle demonstrates global hypokinesis. Indeterminate diastolic filling due to E-A fusion. There is the interventricular septum is flattened in diastole ('D' shaped left ventricle), consistent with right ventricular volume overload.  2. Right ventricular systolic function is moderately reduced. The right ventricular size is mildly enlarged. There is moderately elevated pulmonary artery systolic  pressure.  3. Left  atrial size was mildly dilated.  4. Right atrial size was mild to moderately dilated.  5. The pericardial effusion is circumferential. Large pleural effusion in the left lateral region.  6. The mitral valve is normal in structure. Mild to moderate mitral valve regurgitation.  7. Tricuspid valve regurgitation is moderate to severe.  8. The aortic valve is tricuspid. Aortic valve regurgitation is not visualized.  9. The inferior vena cava is dilated in size with <50% respiratory variability, suggesting right atrial pressure of 15 mmHg. FINDINGS  Left Ventricle: Left ventricular ejection fraction, by estimation, is 35 to 40%. The left ventricle has moderately decreased function. The left ventricle demonstrates global hypokinesis. The left ventricular internal cavity size was normal in size. There is no left ventricular hypertrophy. The interventricular septum is flattened in diastole ('D' shaped left ventricle), consistent with right ventricular volume overload. Indeterminate diastolic filling due to E-A fusion. Right Ventricle: The right ventricular size is mildly enlarged. No increase in right ventricular wall thickness. Right ventricular systolic function is moderately reduced. There is moderately elevated pulmonary artery systolic pressure. The tricuspid regurgitant velocity is 3.03 m/s, and with an assumed right atrial pressure of 15 mmHg, the estimated right ventricular systolic pressure is 27.0 mmHg. Left Atrium: Left atrial size was mildly dilated. Right Atrium: Right atrial size was mild to moderately dilated. Pericardium: Trivial pericardial effusion is present. The pericardial effusion is circumferential. Mitral Valve: The mitral valve is normal in structure. Mild to moderate mitral valve regurgitation, with centrally-directed jet. Tricuspid Valve: The tricuspid valve is normal in structure. Tricuspid valve regurgitation is moderate to severe. Aortic Valve: The aortic valve is tricuspid. Aortic valve  regurgitation is not visualized. Pulmonic Valve: The pulmonic valve was normal in structure. Pulmonic valve regurgitation is not visualized. Aorta: The aortic root and ascending aorta are structurally normal, with no evidence of dilitation. Venous: The inferior vena cava is dilated in size with less than 50% respiratory variability, suggesting right atrial pressure of 15 mmHg. IAS/Shunts: No atrial level shunt detected by color flow Doppler. Additional Comments: There is a large pleural effusion in the left lateral region.  LEFT VENTRICLE PLAX 2D LVIDd:         4.90 cm     Diastology LVIDs:         4.00 cm     LV e' medial:    11.70 cm/s LV PW:         1.00 cm     LV E/e' medial:  11.2 LV IVS:        1.10 cm     LV e' lateral:   19.50 cm/s LVOT diam:     1.90 cm     LV E/e' lateral: 6.7 LV SV:         39 LV SV Index:   24 LVOT Area:     2.84 cm  LV Volumes (MOD) LV vol d, MOD A2C: 99.1 ml LV vol d, MOD A4C: 84.5 ml LV vol s, MOD A2C: 64.8 ml LV vol s, MOD A4C: 52.0 ml LV SV MOD A2C:     34.3 ml LV SV MOD A4C:     84.5 ml LV SV MOD BP:      34.7 ml RIGHT VENTRICLE RV S prime:     700.00 cm/s TAPSE (M-mode): 1.6 cm LEFT ATRIUM             Index       RIGHT ATRIUM  Index LA diam:        3.70 cm 2.31 cm/m  RA Area:     20.00 cm LA Vol (A2C):   79.2 ml 49.36 ml/m RA Volume:   52.00 ml  32.41 ml/m LA Vol (A4C):   47.7 ml 29.73 ml/m LA Biplane Vol: 63.7 ml 39.70 ml/m  AORTIC VALVE LVOT Vmax:   101.00 cm/s LVOT Vmean:  65.100 cm/s LVOT VTI:    0.137 m  AORTA Ao Root diam: 2.40 cm MITRAL VALVE                TRICUSPID VALVE MV Area (PHT): 4.96 cm     TR Peak grad:   36.7 mmHg MV Decel Time: 153 msec     TR Vmax:        303.00 cm/s MV E velocity: 131.00 cm/s MV A velocity: 68.30 cm/s   SHUNTS MV E/A ratio:  1.92         Systemic VTI:  0.14 m                             Systemic Diam: 1.90 cm Dani Gobble Croitoru MD Electronically signed by Sanda Klein MD Signature Date/Time: 07/14/2020/4:18:59 PM    Final       Assessment and Plan:   1. Acute biventricular heart failure  - unclear cause, given her recent severe anemia with hgb down to 3.0 due to heavy menstrual period, she may not be a good cath candidate.   -We will consider additional heart failure medication.  May consider coronary CT as alternative to cath.  -Unclear if biventricular heart failure is driven by stress from severe anemia or chronic tachycardia as result of severe anemia.  Heart rate typically running in the 110s to 120s range at baseline even after blood transfusion.  -Patient has still volume overloaded.  Started on 60 mg twice daily of IV Lasix yesterday, unfortunately I&O is not clearly documented.  -While she is admitted, consider addition of low-dose carvedilol and low-dose Entresto (will need teaching to avoid pregnancy while on ACEI due to contraindication).  Would not recommend aggressive up titration of carvedilol during admission as her current tachycardia is compensatory due to anemia.    2. Mild to moderate MR and moderate to severe TR: will need reassess with repeat echo in 3 month  3. Severe iron deficiency anemia: Felt to be related to heavy menstrual period.  Urine pregnancy test negative.  Stool Hemoccult negative.  Underwent blood transfusion, hemoglobin came up from 3.0 prior to admission to around 8. Uterine fibroids noted. Need to see gynecologist.   4. H/o preeclampsia: during her pregnancy 20 years ago  9. Hypertension: per patient, she was tried several BP medication years ago, however was unable to tolerate due to side effect   Risk Assessment/Risk Scores:        New York Heart Association (NYHA) Functional Class NYHA Class III        For questions or updates, please contact [facility] Please consult [email or web address] for contact info under    Hilbert Corrigan, [place]  07/15/2020 9:08 AM   Attending Note:   The patient was seen and examined.  Agree with assessment and plan as noted above.   Changes made to the above note as needed.  Patient seen and independently examined with  Almyra Deforest, PA .   We discussed all aspects of the encounter. I agree with the assessment and  plan as stated above.  1.   CHF  .  Has moderately reduced LV function .  Has RV volume overload .  Moderate pulmonary HTN Large pleural effusion  Has an s3 gallpp Is diuresing nicely  This LV / RV dysfunction may be due to her profound anemia and subsequent tachycardia. She is improving with lasix  Agree with low dose coreg.  Will start low dose entresto  Pregnancy test is negative  We discussed hysterectomy . She stated that she is not through having children yet.   We discussed the absolute need to avoid pregnancy while on many of our CHF meds ( including entresto)        I have spent a total of 40 minutes with patient reviewing hospital  notes , telemetry, EKGs, labs and examining patient as well as establishing an assessment and plan that was discussed with the patient. > 50% of time was spent in direct patient care.    Thayer Headings, Brooke Bonito., MD, [facility] 07/15/2020, 10:34 AM [address]  [place] Pager [phone]

## 2020-07-15 NOTE — Progress Notes (Signed)
PROGRESS NOTE    Alany Borman  PYP:[id number] DOB: 11-13-1975 DOA: 07/13/2020 PCP: Nolene Ebbs, MD    Brief Narrative:  45 year old African-American female with no significant past medical history except for longstanding anemia presented with hemoglobin of 3.5.  She had previously declined hospitalization.  Was seen by Dr. Lorenso Courier at the cancer clinic on 4/4 and was directly admitted.  Thought to be secondary to chronic loss from GYN issues.  Assessment & Plan:   Active Problems:   Symptomatic anemia   Pedal edema   Tachycardia   Iron deficiency   Menorrhagia   Symptomatic anemia/iron deficiency/chronic blood loss anemia Patient's anemia appears to be primarily longstanding and likely due to longstanding menorrhagia.  CT scan of the abdomen pelvis with findings of uterine fibroids which could be the reason for her menorrhagia.  She will need to eventually see a GYN.  No active bleeding noted currently.   -Pt is now s/p 3 units PRBC's  -Hgb currently stable -repeat CBC in AM  Menorrhagia/uterine fibroids No active bleeding currently.  This has been a longstanding issue.  CT scan showed uterine fibroids.   -Recommend f/u with GYN when discharged  GI bleed ruled out, dark stool secondary to iron Patient mention black-colored stool recently -Stools noted to be heme neg -Currently on PPI -Repeat cbc in AM  Dyspnea/pedal edema/cardiomegaly/pleural effusion/small pericardial effusion with acute systolic chf, chronicity is unknown. New diagnosis -2d echo with EF of 35-40% with evidence of volume overload -Cardiology following -Pt is currently undergoing IV lasix per Cardiology recs -Follow I/o -Repeat bmet in AM - TSH is within normal limits  Abdominal discomfort/hepatomegaly CT scan of the abdomen pelvis did show small amount of free fluid in the abdomen and pelvis with mesenteric edema.  -Findings suggestive of passive hepatic congestion noted -Continue to diurese  per above  Elevated blood pressure/sinus tachycardia Blood pressure noted to be in the hypertensive range.  No previous history of hypertension.  -BP stable at present, cont current regimen  Hypokalemia and hypomagnesemia Remains low, will replace both -Repeat lytes in AM and cont to replace as needed  Right adnexal cyst Incidentally noted on CT scan.  Outpatient management.   DVT prophylaxis: SCD's Code Status: Full Family Communication: Pt in room, family not at bedside  Status is: Inpatient  Remains inpatient appropriate because:IV treatments appropriate due to intensity of illness or inability to take PO and Inpatient level of care appropriate due to severity of illness   Dispo:  Patient From: Home  Planned Disposition: Home  Medically stable for discharge: No      Consultants:   Cardiology  Procedures:     Antimicrobials: Anti-infectives (From admission, onward)   None       Subjective: Without major complaints. Still has B leg swelling this AM  Objective: Vitals:   07/15/20 0211 07/15/20 0500 07/15/20 0613 07/15/20 1029  BP: (!) 155/109  (!) 141/111 127/90  Pulse: (!) 103  100 (!) 104  Resp: 18  18 16   Temp: 97.9 F (36.6 C)  97.8 F (36.6 C) 97.8 F (36.6 C)  TempSrc:      SpO2: 100%  100% 100%  Weight:  63.5 kg    Height:        Intake/Output Summary (Last 24 hours) at 07/15/2020 1130 Last data filed at 07/14/2020 2000 Gross per 24 hour  Intake 859.32 ml  Output --  Net 859.32 ml   Filed Weights   07/13/20 2333 07/15/20 0500  Weight:  63.5 kg 63.5 kg    Examination: General exam: Awake, laying in bed, in nad Respiratory system: Normal respiratory effort, no wheezing Cardiovascular system: regular rate, s1, s2 Gastrointestinal system: Soft, nondistended, positive BS Central nervous system: CN2-12 grossly intact, strength intact Extremities: Perfused, no clubbing, BLE edema Skin: Normal skin turgor, no notable skin lesions  seen Psychiatry: Mood normal // no visual hallucinations   Data Reviewed: I have personally reviewed following labs and imaging studies  CBC: Recent Labs  Lab 07/10/20 1203 07/13/20 1452 07/14/20 1211 07/15/20 0430  WBC 6.4 7.9 8.9 8.5  NEUTROABS  --  5.5  --   --   HGB 3.0* 3.5* 8.2* 8.2*  HCT 13.3* 14.0* 27.2* 27.8*  MCV 55.0* 54.7* 67.2* 68.0*  PLT 643* 556* 465* 169*   Basic Metabolic Panel: Recent Labs  Lab 07/13/20 1452 07/14/20 1211 07/15/20 0430  NA 142 139 142  K 3.0* 3.4* 3.4*  CL 109 111 108  CO2 20* 19* 23  GLUCOSE 104* 96 105*  BUN 8 7 10   CREATININE 0.64 0.42* 0.68  CALCIUM 8.1* 8.3* 8.6*  MG  --  1.7 1.7   GFR: Estimated Creatinine Clearance: 74.7 mL/min (by C-G formula based on SCr of 0.68 mg/dL). Liver Function Tests: Recent Labs  Lab 07/13/20 1452 07/14/20 1211 07/15/20 0430  AST 26 28 24   ALT 24 25 24   ALKPHOS 216* 193* 173*  BILITOT 0.9 1.1 1.2  PROT 7.1 7.3 7.1  ALBUMIN 3.5 3.6 3.4*   No results for input(s): LIPASE, AMYLASE in the last 168 hours. No results for input(s): AMMONIA in the last 168 hours. Coagulation Profile: Recent Labs  Lab 07/13/20 1756  INR 1.4*   Cardiac Enzymes: No results for input(s): CKTOTAL, CKMB, CKMBINDEX, TROPONINI in the last 168 hours. BNP (last 3 results) No results for input(s): PROBNP in the last 8760 hours. HbA1C: No results for input(s): HGBA1C in the last 72 hours. CBG: No results for input(s): GLUCAP in the last 168 hours. Lipid Profile: No results for input(s): CHOL, HDL, LDLCALC, TRIG, CHOLHDL, LDLDIRECT in the last 72 hours. Thyroid Function Tests: Recent Labs    07/13/20 1756  TSH 2.782   Anemia Panel: No results for input(s): VITAMINB12, FOLATE, FERRITIN, TIBC, IRON, RETICCTPCT in the last 72 hours. Sepsis Labs: No results for input(s): PROCALCITON, LATICACIDVEN in the last 168 hours.  Recent Results (from the past 240 hour(s))  Resp Panel by RT-PCR (Flu A&B, Covid)  Nasopharyngeal Swab     Status: None   Collection Time: 07/13/20  5:27 PM   Specimen: Nasopharyngeal Swab; Nasopharyngeal(NP) swabs in vial transport medium  Result Value Ref Range Status   SARS Coronavirus 2 by RT PCR NEGATIVE NEGATIVE Final    Comment: (NOTE) SARS-CoV-2 target nucleic acids are NOT DETECTED.  The SARS-CoV-2 RNA is generally detectable in upper respiratory specimens during the acute phase of infection. The lowest concentration of SARS-CoV-2 viral copies this assay can detect is 138 copies/mL. A negative result does not preclude SARS-Cov-2 infection and should not be used as the sole basis for treatment or other patient management decisions. A negative result may occur with  improper specimen collection/handling, submission of specimen other than nasopharyngeal swab, presence of viral mutation(s) within the areas targeted by this assay, and inadequate number of viral copies(<138 copies/mL). A negative result must be combined with clinical observations, patient history, and epidemiological information. The expected result is Negative.  Fact Sheet for Patients:  EntrepreneurPulse.com.au  Fact Sheet for Healthcare  Providers:  IncredibleEmployment.be  This test is no t yet approved or cleared by the [place] and  has been authorized for detection and/or diagnosis of SARS-CoV-2 by FDA under an Emergency Use Authorization (EUA). This EUA will remain  in effect (meaning this test can be used) for the duration of the COVID-19 declaration under Section 564(b)(1) of the Act, 21 U.S.C.section 360bbb-3(b)(1), unless the authorization is terminated  or revoked sooner.       Influenza A by PCR NEGATIVE NEGATIVE Final   Influenza B by PCR NEGATIVE NEGATIVE Final    Comment: (NOTE) The Xpert Xpress SARS-CoV-2/FLU/RSV plus assay is intended as an aid in the diagnosis of influenza from Nasopharyngeal swab specimens and should not be  used as a sole basis for treatment. Nasal washings and aspirates are unacceptable for Xpert Xpress SARS-CoV-2/FLU/RSV testing.  Fact Sheet for Patients: EntrepreneurPulse.com.au  Fact Sheet for Healthcare Providers: IncredibleEmployment.be  This test is not yet approved or cleared by the [place] FDA and has been authorized for detection and/or diagnosis of SARS-CoV-2 by FDA under an Emergency Use Authorization (EUA). This EUA will remain in effect (meaning this test can be used) for the duration of the COVID-19 declaration under Section 564(b)(1) of the Act, 21 U.S.C. section 360bbb-3(b)(1), unless the authorization is terminated or revoked.  Performed at [facility], [place] [address], [place], [place] [address]      Radiology Studies: DG Chest 2 View  Result Date: 07/14/2020 CLINICAL DATA:  Shortness of breath. EXAM: CHEST - 2 VIEW COMPARISON:  No prior. FINDINGS: Cardiomegaly. Bibasilar atelectasis and infiltrates/edema. Small bilateral pleural effusions. Findings most consistent with CHF. Bibasilar pneumonia cannot be excluded. No pneumothorax. IMPRESSION: Cardiomegaly. Bibasilar atelectasis and infiltrates/edema. Small bilateral pleural effusions. Findings most consistent CHF. Electronically Signed   By: Marcello Moores  Register   On: 07/14/2020 12:44   CT ABDOMEN PELVIS W CONTRAST  Result Date: 07/14/2020 CLINICAL DATA:  Nausea and diffuse abdominal pain. EXAM: CT ABDOMEN AND PELVIS WITH CONTRAST TECHNIQUE: Multidetector CT imaging of the abdomen and pelvis was performed using the standard protocol following bolus administration of intravenous contrast. CONTRAST:  168mL OMNIPAQUE IOHEXOL 300 MG/ML  SOLN COMPARISON:  None. FINDINGS: Lower chest: Moderate bilateral pleural effusions with basilar atelectasis. Cardiac enlargement with small pericardial effusion. Hepatobiliary: Diffuse enlargement of the liver. Heterogeneous poorly  defined low-attenuation throughout the liver. Changes may represent a combination of passive hepatic congestion and heterogeneous fatty infiltration. Pericholecystic fluid is nonspecific but likely related to liver disease. No bile duct dilatation. Pancreas: Unremarkable. No pancreatic ductal dilatation or surrounding inflammatory changes. Spleen: Normal in size without focal abnormality. Adrenals/Urinary Tract: Adrenal glands are unremarkable. Kidneys are normal, without renal calculi, focal lesion, or hydronephrosis. Bladder is unremarkable. Stomach/Bowel: Stomach, small bowel, and colon are not abnormally distended. No wall thickening or inflammatory changes are appreciated. Appendix is normal. Vascular/Lymphatic: No significant vascular findings are present. No enlarged abdominal or pelvic lymph nodes. Reproductive: Heterogeneous nodular enlargement of the uterus consistent with uterine fibroids. Largest measures 3.8 cm diameter. Right adnexal cyst measuring 5 cm diameter. Other: Small amount of free fluid in the abdomen and pelvis with mesenteric edema, likely reactive or related to liver disease. Musculoskeletal: No acute or significant osseous findings. IMPRESSION: 1. Diffuse enlargement of the liver with poorly defined low-attenuation changes, likely to represent a combination of passive hepatic congestion and heterogeneous fatty infiltration. Hepatitis would also be a possibility. 2. Moderate bilateral pleural effusions with basilar atelectasis. 3. Cardiac enlargement with  small pericardial effusion. 4. Small amount of free fluid in the abdomen and pelvis with mesenteric edema, likely reactive or related to liver disease. 5. Uterine fibroids. 6. Right adnexal cyst measuring 5 cm diameter. No follow-up imaging recommended. Note: This recommendation does not apply to premenarchal patients and to those with increased risk (genetic, family history, elevated tumor markers or other high-risk factors) of ovarian  cancer. Reference: JACR 2020 Feb; 17(2):248-254 Electronically Signed   By: Lucienne Capers M.D.   On: 07/14/2020 02:03   ECHOCARDIOGRAM COMPLETE  Result Date: 07/14/2020    ECHOCARDIOGRAM REPORT   Patient Name:   SIMREN POPSON Date of Exam: 07/14/2020 Medical Rec #:  [id number]          Height:       60.0 in Accession #:    [id number]         Weight:       140.1 lb Date of Birth:  Jul 21, 1975          BSA:          1.604 m Patient Age:    67 years           BP:           155/111 mmHg Patient Gender: F                  HR:           118 bpm. Exam Location:  Inpatient Procedure: 2D Echo Indications:    Dyspnea R06.00  History:        Patient has no prior history of Echocardiogram examinations.  Sonographer:    Mikki Santee RDCS (AE) Referring Phys: [place]  1. Left ventricular ejection fraction, by estimation, is 35 to 40%. The left ventricle has moderately decreased function. The left ventricle demonstrates global hypokinesis. Indeterminate diastolic filling due to E-A fusion. There is the interventricular septum is flattened in diastole ('D' shaped left ventricle), consistent with right ventricular volume overload.  2. Right ventricular systolic function is moderately reduced. The right ventricular size is mildly enlarged. There is moderately elevated pulmonary artery systolic pressure.  3. Left atrial size was mildly dilated.  4. Right atrial size was mild to moderately dilated.  5. The pericardial effusion is circumferential. Large pleural effusion in the left lateral region.  6. The mitral valve is normal in structure. Mild to moderate mitral valve regurgitation.  7. Tricuspid valve regurgitation is moderate to severe.  8. The aortic valve is tricuspid. Aortic valve regurgitation is not visualized.  9. The inferior vena cava is dilated in size with <50% respiratory variability, suggesting right atrial pressure of 15 mmHg. FINDINGS  Left Ventricle: Left ventricular ejection  fraction, by estimation, is 35 to 40%. The left ventricle has moderately decreased function. The left ventricle demonstrates global hypokinesis. The left ventricular internal cavity size was normal in size. There is no left ventricular hypertrophy. The interventricular septum is flattened in diastole ('D' shaped left ventricle), consistent with right ventricular volume overload. Indeterminate diastolic filling due to E-A fusion. Right Ventricle: The right ventricular size is mildly enlarged. No increase in right ventricular wall thickness. Right ventricular systolic function is moderately reduced. There is moderately elevated pulmonary artery systolic pressure. The tricuspid regurgitant velocity is 3.03 m/s, and with an assumed right atrial pressure of 15 mmHg, the estimated right ventricular systolic pressure is 50.0 mmHg. Left Atrium: Left atrial size was mildly dilated. Right Atrium: Right atrial size was mild to moderately  dilated. Pericardium: Trivial pericardial effusion is present. The pericardial effusion is circumferential. Mitral Valve: The mitral valve is normal in structure. Mild to moderate mitral valve regurgitation, with centrally-directed jet. Tricuspid Valve: The tricuspid valve is normal in structure. Tricuspid valve regurgitation is moderate to severe. Aortic Valve: The aortic valve is tricuspid. Aortic valve regurgitation is not visualized. Pulmonic Valve: The pulmonic valve was normal in structure. Pulmonic valve regurgitation is not visualized. Aorta: The aortic root and ascending aorta are structurally normal, with no evidence of dilitation. Venous: The inferior vena cava is dilated in size with less than 50% respiratory variability, suggesting right atrial pressure of 15 mmHg. IAS/Shunts: No atrial level shunt detected by color flow Doppler. Additional Comments: There is a large pleural effusion in the left lateral region.  LEFT VENTRICLE PLAX 2D LVIDd:         4.90 cm     Diastology LVIDs:          4.00 cm     LV e' medial:    11.70 cm/s LV PW:         1.00 cm     LV E/e' medial:  11.2 LV IVS:        1.10 cm     LV e' lateral:   19.50 cm/s LVOT diam:     1.90 cm     LV E/e' lateral: 6.7 LV SV:         39 LV SV Index:   24 LVOT Area:     2.84 cm  LV Volumes (MOD) LV vol d, MOD A2C: 99.1 ml LV vol d, MOD A4C: 84.5 ml LV vol s, MOD A2C: 64.8 ml LV vol s, MOD A4C: 52.0 ml LV SV MOD A2C:     34.3 ml LV SV MOD A4C:     84.5 ml LV SV MOD BP:      34.7 ml RIGHT VENTRICLE RV S prime:     700.00 cm/s TAPSE (M-mode): 1.6 cm LEFT ATRIUM             Index       RIGHT ATRIUM           Index LA diam:        3.70 cm 2.31 cm/m  RA Area:     20.00 cm LA Vol (A2C):   79.2 ml 49.36 ml/m RA Volume:   52.00 ml  32.41 ml/m LA Vol (A4C):   47.7 ml 29.73 ml/m LA Biplane Vol: 63.7 ml 39.70 ml/m  AORTIC VALVE LVOT Vmax:   101.00 cm/s LVOT Vmean:  65.100 cm/s LVOT VTI:    0.137 m  AORTA Ao Root diam: 2.40 cm MITRAL VALVE                TRICUSPID VALVE MV Area (PHT): 4.96 cm     TR Peak grad:   36.7 mmHg MV Decel Time: 153 msec     TR Vmax:        303.00 cm/s MV E velocity: 131.00 cm/s MV A velocity: 68.30 cm/s   SHUNTS MV E/A ratio:  1.92         Systemic VTI:  0.14 m                             Systemic Diam: 1.90 cm Dani Gobble Croitoru MD Electronically signed by Sanda Klein MD Signature Date/Time: 07/14/2020/4:18:59 PM    Final     Scheduled Meds: .  sodium chloride   Intravenous Once  . cetirizine  10 mg Oral Daily  . furosemide  60 mg Intravenous Q12H  . pantoprazole (PROTONIX) IV  40 mg Intravenous Q12H   Continuous Infusions:   LOS: 2 days   Marylu Lund, MD [facility] Pager On Amion  If 7PM-7AM, please contact night-coverage 07/15/2020, 11:30 AM

## 2020-07-15 NOTE — Progress Notes (Signed)
Patient didn't want to take the IV lasix ordered for due to the fact that she would not get any sleep if she took at night-time. Notified provider on-call. She stated okay. Notified pharmacy to reschedule so that she would not be taking at night. Will continue to monitor.

## 2020-07-16 DIAGNOSIS — I5032 Chronic diastolic (congestive) heart failure: Secondary | ICD-10-CM

## 2020-07-16 DIAGNOSIS — I5043 Acute on chronic combined systolic (congestive) and diastolic (congestive) heart failure: Secondary | ICD-10-CM

## 2020-07-16 LAB — CBC
HCT: 31.2 % — ABNORMAL LOW (ref 36.0–46.0)
Hemoglobin: 8.8 g/dL — ABNORMAL LOW (ref 12.0–15.0)
MCH: 19.7 pg — ABNORMAL LOW (ref 26.0–34.0)
MCHC: 28.2 g/dL — ABNORMAL LOW (ref 30.0–36.0)
MCV: 70 fL — ABNORMAL LOW (ref 80.0–100.0)
Platelets: 395 10*3/uL (ref 150–400)
RBC: 4.46 MIL/uL (ref 3.87–5.11)
WBC: 9.3 10*3/uL (ref 4.0–10.5)
nRBC: 0.5 % — ABNORMAL HIGH (ref 0.0–0.2)

## 2020-07-16 LAB — COMPREHENSIVE METABOLIC PANEL
ALT: 21 U/L (ref 0–44)
AST: 22 U/L (ref 15–41)
Albumin: 3.1 g/dL — ABNORMAL LOW (ref 3.5–5.0)
Alkaline Phosphatase: 147 U/L — ABNORMAL HIGH (ref 38–126)
Anion gap: 7 (ref 5–15)
BUN: 10 mg/dL (ref 6–20)
CO2: 24 mmol/L (ref 22–32)
Calcium: 8.6 mg/dL — ABNORMAL LOW (ref 8.9–10.3)
Chloride: 111 mmol/L (ref 98–111)
Creatinine, Ser: 0.58 mg/dL (ref 0.44–1.00)
GFR, Estimated: >60 mL/min (ref >60)
Glucose, Bld: 98 mg/dL (ref 70–99)
Potassium: 4.2 mmol/L (ref 3.5–5.1)
Sodium: 142 mmol/L (ref 135–145)
Total Bilirubin: 0.8 mg/dL (ref 0.3–1.2)
Total Protein: 6.5 g/dL (ref 6.5–8.1)

## 2020-07-16 MED ORDER — SACUBITRIL-VALSARTAN 49-51 MG PO TABS
1.0000 | ORAL_TABLET | Freq: Two times a day (BID) | ORAL | Status: DC
  Administered 2020-07-16 – 2020-07-18 (×4): 1 via ORAL
  Filled 2020-07-16 (×6): qty 1

## 2020-07-16 MED ORDER — MENTHOL 3 MG MT LOZG
1.0000 | LOZENGE | OROMUCOSAL | Status: DC | PRN
  Administered 2020-07-16: 3 mg via ORAL
  Filled 2020-07-16: qty 9

## 2020-07-16 MED ORDER — PHENOL 1.4 % MT LIQD: 1.0000 | OROMUCOSAL | Status: DC | PRN

## 2020-07-16 NOTE — Progress Notes (Addendum)
Progress Note  Patient Name: Kelly Guzman Date of Encounter: 07/16/2020  [facility] Cardiologist: Mertie Moores, MD   Subjective   Breathing much better, still with some lower extremity edema.  Inpatient Medications    Scheduled Meds: . sodium chloride   Intravenous Once  . carvedilol  3.125 mg Oral BID WC  . cetirizine  10 mg Oral Daily  . furosemide  60 mg Intravenous BID  . pantoprazole (PROTONIX) IV  40 mg Intravenous Q12H  . sacubitril-valsartan  1 tablet Oral BID   Continuous Infusions:  PRN Meds: acetaminophen **OR** acetaminophen, lip balm, metoprolol tartrate, ondansetron **OR** ondansetron (ZOFRAN) IV   Vital Signs    Vitals:   07/15/20 1849 07/15/20 2030 07/15/20 2127 07/16/20 0630  BP: (!) 133/95 (!) 147/107 (!) 135/98 (!) 149/111  Pulse: (!) 107 (!) 108 (!) 102 98  Resp: 18 (!) 24 20 18   Temp: 99.2 F (37.3 C) 98.9 F (37.2 C) 99.1 F (37.3 C) 98.3 F (36.8 C)  TempSrc:  Oral Oral Oral  SpO2: 100% 100% 99% 100%  Weight:    61.4 kg  Height:        Intake/Output Summary (Last 24 hours) at 07/16/2020 0751 Last data filed at 07/15/2020 1400 Gross per 24 hour  Intake 720 ml  Output --  Net 720 ml   Last 3 Weights 07/16/2020 07/15/2020 07/13/2020  Weight (lbs) 135 lb 5.8 oz 139 lb 15.9 oz 140 lb 1.3 oz  Weight (kg) 61.4 kg 63.5 kg 63.54 kg      Telemetry    Sinus rhythm, sinus tach- Personally Reviewed  ECG    None today - Personally Reviewed  Physical Exam   GEN: No acute distress.   Neck: No JVD Cardiac: RRR, no murmurs, rubs, or gallops.  Respiratory: Clear to auscultation bilaterally. GI: Soft, nontender, non-distended  MS:  1+ lower extremity edema; No deformity. Neuro:  Nonfocal  Psych: Normal affect   Labs    High Sensitivity Troponin:  No results for input(s): TROPONINIHS in the last 720 hours.    Chemistry Recent Labs  Lab 07/14/20 1211 07/15/20 0430 07/16/20 0415  NA 139 142 142  K 3.4* 3.4* 4.2  CL 111 108 111   CO2 19* 23 24  GLUCOSE 96 105* 98  BUN 7 10 10   CREATININE 0.42* 0.68 0.58  CALCIUM 8.3* 8.6* 8.6*  PROT 7.3 7.1 6.5  ALBUMIN 3.6 3.4* 3.1*  AST 28 24 22   ALT 25 24 21   ALKPHOS 193* 173* 147*  BILITOT 1.1 1.2 0.8  GFRNONAA >60 >60 >60  ANIONGAP 9 11 7      Hematology Recent Labs  Lab 07/14/20 1211 07/15/20 0430 07/16/20 0415  WBC 8.9 8.5 9.3  RBC 4.05 4.09 4.46  HGB 8.2* 8.2* 8.8*  HCT 27.2* 27.8* 31.2*  MCV 67.2* 68.0* 70.0*  MCH 20.2* 20.0* 19.7*  MCHC 30.1 29.5* 28.2*  RDW Not Measured 33.1* Not Measured  PLT 465* 427* 395    BNP Recent Labs  Lab 07/14/20 1211  BNP 1,133.9*    Magnesium  Date Value Ref Range Status  07/15/2020 1.7 1.7 - 2.4 mg/dL Final    Comment:    Performed at [facility], [place] [address], [place], [place] [address]  07/14/2020 1.7 1.7 - 2.4 mg/dL Final    Comment:    Performed at [facility], [place] [address], [place], [place] [address]   Lab Results  Component Value Date   TSH  2.782 07/13/2020    DDimer No results for input(s): DDIMER in the last 168 hours.   Radiology    DG Chest 2 View  Result Date: 07/14/2020 CLINICAL DATA:  Shortness of breath. EXAM: CHEST - 2 VIEW COMPARISON:  No prior. FINDINGS: Cardiomegaly. Bibasilar atelectasis and infiltrates/edema. Small bilateral pleural effusions. Findings most consistent with CHF. Bibasilar pneumonia cannot be excluded. No pneumothorax. IMPRESSION: Cardiomegaly. Bibasilar atelectasis and infiltrates/edema. Small bilateral pleural effusions. Findings most consistent CHF. Electronically Signed   By: Marcello Moores  Register   On: 07/14/2020 12:44   ECHOCARDIOGRAM COMPLETE  Result Date: 07/14/2020    ECHOCARDIOGRAM REPORT   Patient Name:   [name] Date of Exam: 07/14/2020 Medical Rec #:  [id number]          Height:       60.0 in Accession #:    [id number]         Weight:       140.1 lb Date of Birth:  10/01/1975          BSA:          1.604 m Patient  Age:    45 years           BP:           155/111 mmHg Patient Gender: F                  HR:           118 bpm. Exam Location:  Inpatient Procedure: 2D Echo Indications:    Dyspnea R06.00  History:        Patient has no prior history of Echocardiogram examinations.  Sonographer:    Mikki Santee RDCS (AE) Referring Phys: [place]  1. Left ventricular ejection fraction, by estimation, is 35 to 40%. The left ventricle has moderately decreased function. The left ventricle demonstrates global hypokinesis. Indeterminate diastolic filling due to E-A fusion. There is the interventricular septum is flattened in diastole ('D' shaped left ventricle), consistent with right ventricular volume overload.  2. Right ventricular systolic function is moderately reduced. The right ventricular size is mildly enlarged. There is moderately elevated pulmonary artery systolic pressure.  3. Left atrial size was mildly dilated.  4. Right atrial size was mild to moderately dilated.  5. The pericardial effusion is circumferential. Large pleural effusion in the left lateral region.  6. The mitral valve is normal in structure. Mild to moderate mitral valve regurgitation.  7. Tricuspid valve regurgitation is moderate to severe.  8. The aortic valve is tricuspid. Aortic valve regurgitation is not visualized.  9. The inferior vena cava is dilated in size with <50% respiratory variability, suggesting right atrial pressure of 15 mmHg. FINDINGS  Left Ventricle: Left ventricular ejection fraction, by estimation, is 35 to 40%. The left ventricle has moderately decreased function. The left ventricle demonstrates global hypokinesis. The left ventricular internal cavity size was normal in size. There is no left ventricular hypertrophy. The interventricular septum is flattened in diastole ('D' shaped left ventricle), consistent with right ventricular volume overload. Indeterminate diastolic filling due to E-A fusion. Right  Ventricle: The right ventricular size is mildly enlarged. No increase in right ventricular wall thickness. Right ventricular systolic function is moderately reduced. There is moderately elevated pulmonary artery systolic pressure. The tricuspid regurgitant velocity is 3.03 m/s, and with an assumed right atrial pressure of 15 mmHg, the estimated right ventricular systolic pressure is 02.5 mmHg. Left Atrium: Left atrial size was mildly dilated. Right  Atrium: Right atrial size was mild to moderately dilated. Pericardium: Trivial pericardial effusion is present. The pericardial effusion is circumferential. Mitral Valve: The mitral valve is normal in structure. Mild to moderate mitral valve regurgitation, with centrally-directed jet. Tricuspid Valve: The tricuspid valve is normal in structure. Tricuspid valve regurgitation is moderate to severe. Aortic Valve: The aortic valve is tricuspid. Aortic valve regurgitation is not visualized. Pulmonic Valve: The pulmonic valve was normal in structure. Pulmonic valve regurgitation is not visualized. Aorta: The aortic root and ascending aorta are structurally normal, with no evidence of dilitation. Venous: The inferior vena cava is dilated in size with less than 50% respiratory variability, suggesting right atrial pressure of 15 mmHg. IAS/Shunts: No atrial level shunt detected by color flow Doppler. Additional Comments: There is a large pleural effusion in the left lateral region.  LEFT VENTRICLE PLAX 2D LVIDd:         4.90 cm     Diastology LVIDs:         4.00 cm     LV e' medial:    11.70 cm/s LV PW:         1.00 cm     LV E/e' medial:  11.2 LV IVS:        1.10 cm     LV e' lateral:   19.50 cm/s LVOT diam:     1.90 cm     LV E/e' lateral: 6.7 LV SV:         39 LV SV Index:   24 LVOT Area:     2.84 cm  LV Volumes (MOD) LV vol d, MOD A2C: 99.1 ml LV vol d, MOD A4C: 84.5 ml LV vol s, MOD A2C: 64.8 ml LV vol s, MOD A4C: 52.0 ml LV SV MOD A2C:     34.3 ml LV SV MOD A4C:     84.5 ml  LV SV MOD BP:      34.7 ml RIGHT VENTRICLE RV S prime:     700.00 cm/s TAPSE (M-mode): 1.6 cm LEFT ATRIUM             Index       RIGHT ATRIUM           Index LA diam:        3.70 cm 2.31 cm/m  RA Area:     20.00 cm LA Vol (A2C):   79.2 ml 49.36 ml/m RA Volume:   52.00 ml  32.41 ml/m LA Vol (A4C):   47.7 ml 29.73 ml/m LA Biplane Vol: 63.7 ml 39.70 ml/m  AORTIC VALVE LVOT Vmax:   101.00 cm/s LVOT Vmean:  65.100 cm/s LVOT VTI:    0.137 m  AORTA Ao Root diam: 2.40 cm MITRAL VALVE                TRICUSPID VALVE MV Area (PHT): 4.96 cm     TR Peak grad:   36.7 mmHg MV Decel Time: 153 msec     TR Vmax:        303.00 cm/s MV E velocity: 131.00 cm/s MV A velocity: 68.30 cm/s   SHUNTS MV E/A ratio:  1.92         Systemic VTI:  0.14 m                             Systemic Diam: 1.90 cm Dani Gobble Croitoru MD Electronically signed by Sanda Klein MD Signature Date/Time: 07/14/2020/4:18:59 PM  Final     Cardiac Studies   ECHO: 07/14/2020 1. Left ventricular ejection fraction, by estimation, is 35 to 40%. The  left ventricle has moderately decreased function. The left ventricle  demonstrates global hypokinesis. Indeterminate diastolic filling due to  E-A fusion. There is the interventricular septum is flattened in diastole ('D' shaped left ventricle), consistent with right ventricular volume overload.  2. Right ventricular systolic function is moderately reduced. The right  ventricular size is mildly enlarged. There is moderately elevated  pulmonary artery systolic pressure w/ RVSP 51.7 mmHg.  3. Left atrial size was mildly dilated.  4. Right atrial size was mild to moderately dilated.  5. The pericardial effusion is circumferential. Large pleural effusion in  the left lateral region.  6. The mitral valve is normal in structure. Mild to moderate mitral valve  regurgitation.  7. Tricuspid valve regurgitation is moderate to severe.  8. The aortic valve is tricuspid. Aortic valve regurgitation is not   visualized.  9. The inferior vena cava is dilated in size with <50% respiratory  variability, suggesting right atrial pressure of 15 mmHg.   Patient Profile     45 y.o. female w/ hx HTN (intol mult meds), chronic IDA req transfusion 07/10/2020, pre-eclampsia, who was admitted 04/04 with DOE, LE edema, EF 35-40% on echo.  Assessment & Plan    1. Acute systolic CHF w/ biventricular failure:  - unclear cause, no hx ischemic sx - w/ recent Hgb 3.0, no cath for now, consider cardiac CT - on low-dose Coreg and Entresto -BP and heart rate will tolerate a higher dose of carvedilol, will increase to 6.25 mg twice daily -Continue IV Lasix for now, possibly can change to p.o. in a.m. -Weight down 5 pounds from admission, she feels her dry weight is approximately 132 pounds  2. Mild-mod MR & mod-severe TR - Follow w/ repeat echo once volume improved and has been on GDMT  3. Severe IDA felt 2nd menorrhagia - preg and FOB negative - s/p transfusion 3 U PRBCs - Uterine fibroids seen on CT -Discussed the situation with her and strongly encouraged her to follow-up with OB/GYN after discharge - per IM  4. HTN - pt did not tolerate meds previously and stopped them all due to side effects -On further discussion, most of her side effects came from iron supplements - BP as high as 151/111 on admit -Encouraged her to follow blood pressure after discharge and explained that we will only be putting her on low doses of medications.    For questions or updates, please contact [place] Please consult [email or web address] for contact info under       Signed, Rosaria Ferries, PA-C  07/16/2020, 7:51 AM     Attending Note:   The patient was seen and examined.  Agree with assessment and plan as noted above.  Changes made to the above note as needed.  Patient seen and independently examined with Rosaria Ferries, PA .   We discussed all aspects of the encounter. I agree with the assessment and plan as  stated above.  1.   CHF  Cont coreg, entresto.  Agree with changing the lasix to po tomorrow   2.   HTN :   BP remains elevated.  Will increase entresto to 49-51 BID  Starting tonight     I have spent a total of 40 minutes with patient reviewing hospital  notes , telemetry, EKGs, labs and examining patient as well as establishing an assessment and plan  that was discussed with the patient. > 50% of time was spent in direct patient care.    Thayer Headings, Brooke Bonito., MD, [facility] 07/16/2020, 11:27 AM [address]  [place] Pager [phone]

## 2020-07-16 NOTE — Progress Notes (Signed)
PROGRESS NOTE    Kelly Guzman  EQA:[id number] DOB: 1975-07-29 DOA: 07/13/2020 PCP: Nolene Ebbs, MD    Brief Narrative:  45 year old African-American female with no significant past medical history except for longstanding anemia presented with hemoglobin of 3.5.  She had previously declined hospitalization.  Was seen by Dr. Lorenso Courier at the cancer clinic on 4/4 and was directly admitted.  Thought to be secondary to chronic loss from GYN issues.  Assessment & Plan:   Active Problems:   Symptomatic anemia   Pedal edema   Tachycardia   Iron deficiency   Menorrhagia   Symptomatic anemia/iron deficiency/chronic blood loss anemia Patient's anemia appears to be primarily longstanding and likely due to longstanding menorrhagia.  CT scan of the abdomen pelvis with findings of uterine fibroids which could be the reason for her menorrhagia.  She will need to eventually see a GYN.   -Staff reports minimal spotting overnight   -Pt is now s/p 3 units PRBC's since presentation -Hgb currently remains stable -repeat CBC in AM  Menorrhagia/uterine fibroids No active bleeding currently.  This has been a longstanding issue.  CT scan showed uterine fibroids.   -Recommend f/u with GYN when discharged  GI bleed ruled out, dark stool secondary to iron Patient mention black-colored stool recently -Stools noted to be heme neg -had been on PPI -Repeat cbc in AM  Dyspnea/pedal edema/cardiomegaly/pleural effusion/small pericardial effusion with acute systolic chf, chronicity is unknown. New diagnosis -2d echo with EF of 35-40% with evidence of volume overload -Cardiology following -Pt is currently undergoing IV lasix per Cardiology recs -Follow I/o -Recheck bmet in AM - TSH is within normal limits  Abdominal discomfort/hepatomegaly CT scan of the abdomen pelvis did show small amount of free fluid in the abdomen and pelvis with mesenteric edema.  -Findings suggestive of passive hepatic  congestion noted -Continue to diurese per above  Elevated blood pressure/sinus tachycardia Blood pressure noted to be in the hypertensive range.  No previous history of hypertension.  -BP stable at present, cont current regimen  Hypokalemia and hypomagnesemia Remains low, will replace both -Recheck lytes in AM and cont to replace as needed  Right adnexal cyst Incidentally noted on CT scan.  Outpatient management.   DVT prophylaxis: SCD's Code Status: Full Family Communication: Pt in room, family not at bedside  Status is: Inpatient  Remains inpatient appropriate because:IV treatments appropriate due to intensity of illness or inability to take PO and Inpatient level of care appropriate due to severity of illness   Dispo:  Patient From: Home  Planned Disposition: Home  Medically stable for discharge: No      Consultants:   Cardiology  Procedures:     Antimicrobials: Anti-infectives (From admission, onward)   None      Subjective: Reports feeling better. Still swollen, but improved  Objective: Vitals:   07/15/20 1849 07/15/20 2030 07/15/20 2127 07/16/20 0630  BP: (!) 133/95 (!) 147/107 (!) 135/98 (!) 149/111  Pulse: (!) 107 (!) 108 (!) 102 98  Resp: 18 (!) 24 20 18   Temp: 99.2 F (37.3 C) 98.9 F (37.2 C) 99.1 F (37.3 C) 98.3 F (36.8 C)  TempSrc:  Oral Oral Oral  SpO2: 100% 100% 99% 100%  Weight:    61.4 kg  Height:        Intake/Output Summary (Last 24 hours) at 07/16/2020 1109 Last data filed at 07/15/2020 1400 Gross per 24 hour  Intake 360 ml  Output --  Net 360 ml   [employer]  07/13/20 2333 07/15/20 0500 07/16/20 0630  Weight: 63.5 kg 63.5 kg 61.4 kg    Examination: General exam: Conversant, in no acute distress Respiratory system: normal chest rise, clear, no audible wheezing Cardiovascular system: regular rhythm, s1-s2 Gastrointestinal system: Nondistended, nontender, pos BS Central nervous system: No seizures, no  tremors Extremities: No cyanosis, no joint deformities, BLE edema Skin: No rashes, no pallor Psychiatry: Affect normal // no auditory hallucinations    Data Reviewed: I have personally reviewed following labs and imaging studies  CBC: Recent Labs  Lab 07/10/20 1203 07/13/20 1452 07/14/20 1211 07/15/20 0430 07/16/20 0415  WBC 6.4 7.9 8.9 8.5 9.3  NEUTROABS  --  5.5  --   --   --   HGB 3.0* 3.5* 8.2* 8.2* 8.8*  HCT 13.3* 14.0* 27.2* 27.8* 31.2*  MCV 55.0* 54.7* 67.2* 68.0* 70.0*  PLT 643* 556* 465* 427* 161   Basic Metabolic Panel: Recent Labs  Lab 07/13/20 1452 07/14/20 1211 07/15/20 0430 07/16/20 0415  NA 142 139 142 142  K 3.0* 3.4* 3.4* 4.2  CL 109 111 108 111  CO2 20* 19* 23 24  GLUCOSE 104* 96 105* 98  BUN 8 7 10 10   CREATININE 0.64 0.42* 0.68 0.58  CALCIUM 8.1* 8.3* 8.6* 8.6*  MG  --  1.7 1.7  --    GFR: Estimated Creatinine Clearance: 73.5 mL/min (by C-G formula based on SCr of 0.58 mg/dL). Liver Function Tests: Recent Labs  Lab 07/13/20 1452 07/14/20 1211 07/15/20 0430 07/16/20 0415  AST 26 28 24 22   ALT 24 25 24 21   ALKPHOS 216* 193* 173* 147*  BILITOT 0.9 1.1 1.2 0.8  PROT 7.1 7.3 7.1 6.5  ALBUMIN 3.5 3.6 3.4* 3.1*   No results for input(s): LIPASE, AMYLASE in the last 168 hours. No results for input(s): AMMONIA in the last 168 hours. Coagulation Profile: Recent Labs  Lab 07/13/20 1756  INR 1.4*   Cardiac Enzymes: No results for input(s): CKTOTAL, CKMB, CKMBINDEX, TROPONINI in the last 168 hours. BNP (last 3 results) No results for input(s): PROBNP in the last 8760 hours. HbA1C: No results for input(s): HGBA1C in the last 72 hours. CBG: No results for input(s): GLUCAP in the last 168 hours. Lipid Profile: No results for input(s): CHOL, HDL, LDLCALC, TRIG, CHOLHDL, LDLDIRECT in the last 72 hours. Thyroid Function Tests: Recent Labs    07/13/20 1756  TSH 2.782   Anemia Panel: No results for input(s): VITAMINB12, FOLATE, FERRITIN,  TIBC, IRON, RETICCTPCT in the last 72 hours. Sepsis Labs: No results for input(s): PROCALCITON, LATICACIDVEN in the last 168 hours.  Recent Results (from the past 240 hour(s))  Resp Panel by RT-PCR (Flu A&B, Covid) Nasopharyngeal Swab     Status: None   Collection Time: 07/13/20  5:27 PM   Specimen: Nasopharyngeal Swab; Nasopharyngeal(NP) swabs in vial transport medium  Result Value Ref Range Status   SARS Coronavirus 2 by RT PCR NEGATIVE NEGATIVE Final    Comment: (NOTE) SARS-CoV-2 target nucleic acids are NOT DETECTED.  The SARS-CoV-2 RNA is generally detectable in upper respiratory specimens during the acute phase of infection. The lowest concentration of SARS-CoV-2 viral copies this assay can detect is 138 copies/mL. A negative result does not preclude SARS-Cov-2 infection and should not be used as the sole basis for treatment or other patient management decisions. A negative result may occur with  improper specimen collection/handling, submission of specimen other than nasopharyngeal swab, presence of viral mutation(s) within the areas targeted by this  assay, and inadequate number of viral copies(<138 copies/mL). A negative result must be combined with clinical observations, patient history, and epidemiological information. The expected result is Negative.  Fact Sheet for Patients:  EntrepreneurPulse.com.au  Fact Sheet for Healthcare Providers:  IncredibleEmployment.be  This test is no t yet approved or cleared by the [place] FDA and  has been authorized for detection and/or diagnosis of SARS-CoV-2 by FDA under an Emergency Use Authorization (EUA). This EUA will remain  in effect (meaning this test can be used) for the duration of the COVID-19 declaration under Section 564(b)(1) of the Act, 21 U.S.C.section 360bbb-3(b)(1), unless the authorization is terminated  or revoked sooner.       Influenza A by PCR NEGATIVE NEGATIVE Final    Influenza B by PCR NEGATIVE NEGATIVE Final    Comment: (NOTE) The Xpert Xpress SARS-CoV-2/FLU/RSV plus assay is intended as an aid in the diagnosis of influenza from Nasopharyngeal swab specimens and should not be used as a sole basis for treatment. Nasal washings and aspirates are unacceptable for Xpert Xpress SARS-CoV-2/FLU/RSV testing.  Fact Sheet for Patients: EntrepreneurPulse.com.au  Fact Sheet for Healthcare Providers: IncredibleEmployment.be  This test is not yet approved or cleared by the [place] FDA and has been authorized for detection and/or diagnosis of SARS-CoV-2 by FDA under an Emergency Use Authorization (EUA). This EUA will remain in effect (meaning this test can be used) for the duration of the COVID-19 declaration under Section 564(b)(1) of the Act, 21 U.S.C. section 360bbb-3(b)(1), unless the authorization is terminated or revoked.  Performed at [facility], [place] [address] [place], [place] [address]      Radiology Studies: ECHOCARDIOGRAM COMPLETE  Result Date: 07/14/2020    ECHOCARDIOGRAM REPORT   Patient Name:   JASDEEP DEJARNETT Date of Exam: 07/14/2020 Medical Rec #:  [id number]          Height:       60.0 in Accession #:    [id number]         Weight:       140.1 lb Date of Birth:  12-12-1975          BSA:          1.604 m Patient Age:    69 years           BP:           155/111 mmHg Patient Gender: F                  HR:           118 bpm. Exam Location:  Inpatient Procedure: 2D Echo Indications:    Dyspnea R06.00  History:        Patient has no prior history of Echocardiogram examinations.  Sonographer:    Mikki Santee RDCS (AE) Referring Phys: Cecil  1. Left ventricular ejection fraction, by estimation, is 35 to 40%. The left ventricle has moderately decreased function. The left ventricle demonstrates global hypokinesis. Indeterminate diastolic filling due to E-A  fusion. There is the interventricular septum is flattened in diastole ('D' shaped left ventricle), consistent with right ventricular volume overload.  2. Right ventricular systolic function is moderately reduced. The right ventricular size is mildly enlarged. There is moderately elevated pulmonary artery systolic pressure.  3. Left atrial size was mildly dilated.  4. Right atrial size was mild to moderately dilated.  5. The pericardial effusion is circumferential. Large pleural effusion in the left lateral region.  6.  The mitral valve is normal in structure. Mild to moderate mitral valve regurgitation.  7. Tricuspid valve regurgitation is moderate to severe.  8. The aortic valve is tricuspid. Aortic valve regurgitation is not visualized.  9. The inferior vena cava is dilated in size with <50% respiratory variability, suggesting right atrial pressure of 15 mmHg. FINDINGS  Left Ventricle: Left ventricular ejection fraction, by estimation, is 35 to 40%. The left ventricle has moderately decreased function. The left ventricle demonstrates global hypokinesis. The left ventricular internal cavity size was normal in size. There is no left ventricular hypertrophy. The interventricular septum is flattened in diastole ('D' shaped left ventricle), consistent with right ventricular volume overload. Indeterminate diastolic filling due to E-A fusion. Right Ventricle: The right ventricular size is mildly enlarged. No increase in right ventricular wall thickness. Right ventricular systolic function is moderately reduced. There is moderately elevated pulmonary artery systolic pressure. The tricuspid regurgitant velocity is 3.03 m/s, and with an assumed right atrial pressure of 15 mmHg, the estimated right ventricular systolic pressure is 66.5 mmHg. Left Atrium: Left atrial size was mildly dilated. Right Atrium: Right atrial size was mild to moderately dilated. Pericardium: Trivial pericardial effusion is present. The pericardial  effusion is circumferential. Mitral Valve: The mitral valve is normal in structure. Mild to moderate mitral valve regurgitation, with centrally-directed jet. Tricuspid Valve: The tricuspid valve is normal in structure. Tricuspid valve regurgitation is moderate to severe. Aortic Valve: The aortic valve is tricuspid. Aortic valve regurgitation is not visualized. Pulmonic Valve: The pulmonic valve was normal in structure. Pulmonic valve regurgitation is not visualized. Aorta: The aortic root and ascending aorta are structurally normal, with no evidence of dilitation. Venous: The inferior vena cava is dilated in size with less than 50% respiratory variability, suggesting right atrial pressure of 15 mmHg. IAS/Shunts: No atrial level shunt detected by color flow Doppler. Additional Comments: There is a large pleural effusion in the left lateral region.  LEFT VENTRICLE PLAX 2D LVIDd:         4.90 cm     Diastology LVIDs:         4.00 cm     LV e' medial:    11.70 cm/s LV PW:         1.00 cm     LV E/e' medial:  11.2 LV IVS:        1.10 cm     LV e' lateral:   19.50 cm/s LVOT diam:     1.90 cm     LV E/e' lateral: 6.7 LV SV:         39 LV SV Index:   24 LVOT Area:     2.84 cm  LV Volumes (MOD) LV vol d, MOD A2C: 99.1 ml LV vol d, MOD A4C: 84.5 ml LV vol s, MOD A2C: 64.8 ml LV vol s, MOD A4C: 52.0 ml LV SV MOD A2C:     34.3 ml LV SV MOD A4C:     84.5 ml LV SV MOD BP:      34.7 ml RIGHT VENTRICLE RV S prime:     700.00 cm/s TAPSE (M-mode): 1.6 cm LEFT ATRIUM             Index       RIGHT ATRIUM           Index LA diam:        3.70 cm 2.31 cm/m  RA Area:     20.00 cm LA Vol (A2C):   79.2  ml 49.36 ml/m RA Volume:   52.00 ml  32.41 ml/m LA Vol (A4C):   47.7 ml 29.73 ml/m LA Biplane Vol: 63.7 ml 39.70 ml/m  AORTIC VALVE LVOT Vmax:   101.00 cm/s LVOT Vmean:  65.100 cm/s LVOT VTI:    0.137 m  AORTA Ao Root diam: 2.40 cm MITRAL VALVE                TRICUSPID VALVE MV Area (PHT): 4.96 cm     TR Peak grad:   36.7 mmHg MV Decel  Time: 153 msec     TR Vmax:        303.00 cm/s MV E velocity: 131.00 cm/s MV A velocity: 68.30 cm/s   SHUNTS MV E/A ratio:  1.92         Systemic VTI:  0.14 m                             Systemic Diam: 1.90 cm Dani Gobble Croitoru MD Electronically signed by Sanda Klein MD Signature Date/Time: 07/14/2020/4:18:59 PM    Final     Scheduled Meds: . sodium chloride   Intravenous Once  . carvedilol  3.125 mg Oral BID WC  . cetirizine  10 mg Oral Daily  . furosemide  60 mg Intravenous BID  . pantoprazole (PROTONIX) IV  40 mg Intravenous Q12H  . sacubitril-valsartan  1 tablet Oral BID   Continuous Infusions:   LOS: 3 days   Marylu Lund, MD [facility] Pager On Amion  If 7PM-7AM, please contact night-coverage 07/16/2020, 11:09 AM

## 2020-07-17 ENCOUNTER — Inpatient Hospital Stay (HOSPITAL_COMMUNITY): Payer: Medicaid Other

## 2020-07-17 DIAGNOSIS — R252 Cramp and spasm: Secondary | ICD-10-CM

## 2020-07-17 DIAGNOSIS — I5043 Acute on chronic combined systolic (congestive) and diastolic (congestive) heart failure: Secondary | ICD-10-CM | POA: Diagnosis not present

## 2020-07-17 DIAGNOSIS — D649 Anemia, unspecified: Secondary | ICD-10-CM | POA: Diagnosis not present

## 2020-07-17 LAB — CBC
HCT: 35.1 % — ABNORMAL LOW (ref 36.0–46.0)
Hemoglobin: 9.8 g/dL — ABNORMAL LOW (ref 12.0–15.0)
MCH: 19.6 pg — ABNORMAL LOW (ref 26.0–34.0)
MCHC: 27.9 g/dL — ABNORMAL LOW (ref 30.0–36.0)
MCV: 70.1 fL — ABNORMAL LOW (ref 80.0–100.0)
Platelets: 407 10*3/uL — ABNORMAL HIGH (ref 150–400)
RBC: 5.01 MIL/uL (ref 3.87–5.11)
WBC: 7.6 10*3/uL (ref 4.0–10.5)
nRBC: 0.3 % — ABNORMAL HIGH (ref 0.0–0.2)

## 2020-07-17 LAB — COMPREHENSIVE METABOLIC PANEL
ALT: 27 U/L (ref 0–44)
AST: 30 U/L (ref 15–41)
Albumin: 3.3 g/dL — ABNORMAL LOW (ref 3.5–5.0)
Alkaline Phosphatase: 139 U/L — ABNORMAL HIGH (ref 38–126)
Anion gap: 9 (ref 5–15)
BUN: 11 mg/dL (ref 6–20)
CO2: 25 mmol/L (ref 22–32)
Calcium: 8.8 mg/dL — ABNORMAL LOW (ref 8.9–10.3)
Chloride: 104 mmol/L (ref 98–111)
Creatinine, Ser: 0.52 mg/dL (ref 0.44–1.00)
GFR, Estimated: >60 mL/min (ref >60)
Glucose, Bld: 101 mg/dL — ABNORMAL HIGH (ref 70–99)
Potassium: 3.3 mmol/L — ABNORMAL LOW (ref 3.5–5.1)
Sodium: 138 mmol/L (ref 135–145)
Total Bilirubin: 1.1 mg/dL (ref 0.3–1.2)
Total Protein: 6.9 g/dL (ref 6.5–8.1)

## 2020-07-17 MED ORDER — SPIRONOLACTONE 25 MG PO TABS
25.0000 mg | ORAL_TABLET | Freq: Every day | ORAL | Status: DC
  Administered 2020-07-17 – 2020-07-18 (×2): 25 mg via ORAL
  Filled 2020-07-17 (×2): qty 1

## 2020-07-17 MED ORDER — CARVEDILOL 6.25 MG PO TABS
6.2500 mg | ORAL_TABLET | Freq: Two times a day (BID) | ORAL | Status: DC
  Administered 2020-07-17: 6.25 mg via ORAL
  Filled 2020-07-17: qty 1

## 2020-07-17 MED ORDER — CARVEDILOL 12.5 MG PO TABS
12.5000 mg | ORAL_TABLET | Freq: Two times a day (BID) | ORAL | Status: DC
  Administered 2020-07-17: 12.5 mg via ORAL
  Filled 2020-07-17: qty 1

## 2020-07-17 MED ORDER — CARVEDILOL 6.25 MG PO TABS
6.2500 mg | ORAL_TABLET | Freq: Once | ORAL | Status: AC
  Administered 2020-07-17: 6.25 mg via ORAL
  Filled 2020-07-17: qty 1

## 2020-07-17 MED ORDER — POTASSIUM CHLORIDE CRYS ER 20 MEQ PO TBCR
40.0000 meq | EXTENDED_RELEASE_TABLET | Freq: Two times a day (BID) | ORAL | Status: DC
  Administered 2020-07-17: 40 meq via ORAL
  Filled 2020-07-17: qty 2

## 2020-07-17 MED ORDER — TRAMADOL HCL 50 MG PO TABS
50.0000 mg | ORAL_TABLET | Freq: Four times a day (QID) | ORAL | Status: DC | PRN
  Administered 2020-07-17: 50 mg via ORAL
  Filled 2020-07-17: qty 1

## 2020-07-17 NOTE — CV Procedure (Signed)
LLE venous duplex completed.  Results can be found under chart review under CV PROC. 07/17/2020 4:19 PM Yianna Tersigni RVT, RDMS

## 2020-07-17 NOTE — Progress Notes (Signed)
PROGRESS NOTE    Kelly Guzman  OEU:[id number] DOB: April 08, 1976 DOA: 07/13/2020 PCP: Nolene Ebbs, MD    Brief Narrative:  45 year old African-American female with no significant past medical history except for longstanding anemia presented with hemoglobin of 3.5.  She had previously declined hospitalization.  Was seen by Dr. Lorenso Courier at the cancer clinic on 4/4 and was directly admitted.  Thought to be secondary to chronic loss from GYN issues.  Assessment & Plan:   Active Problems:   Symptomatic anemia   Pedal edema   Tachycardia   Iron deficiency   Menorrhagia   Acute on chronic combined systolic and diastolic CHF (congestive heart failure) (HCC)   Symptomatic anemia/iron deficiency/chronic blood loss anemia Patient's anemia appears to be primarily longstanding and likely due to longstanding menorrhagia.  CT scan of the abdomen pelvis with findings of uterine fibroids which could be the reason for her menorrhagia.  She will need to eventually see a GYN.   -Staff reports minimal spotting overnight   -Pt is now s/p 3 units PRBC's since presentation -Pt is reporting minimal spotting although hgb is trending up -Pt recommended to f/u closely with GYN as outpatient -repeat CBC in AM  Menorrhagia/uterine fibroids This has been a longstanding issue.  CT scan showed uterine fibroids.   -Recommend f/u with GYN when discharged per above  GI bleed ruled out, dark stool secondary to iron Patient mention black-colored stool recently -Stools noted to be heme neg -had been on PPI -Repeat cbc in AM  Dyspnea/pedal edema/cardiomegaly/pleural effusion/small pericardial effusion with acute systolic chf, chronicity is unknown. New diagnosis -2d echo with EF of 35-40% with evidence of volume overload -Cardiology following -Had been continued on IV lasix with good diuresis -Lasix now d/c'd as pt is now more euvolemic -Spironolactone ordered per Cardiology -Recheck bmet in  AM  Abdominal discomfort/hepatomegaly CT scan of the abdomen pelvis did show small amount of free fluid in the abdomen and pelvis with mesenteric edema.  -Findings suggestive of passive hepatic congestion noted  Elevated blood pressure/sinus tachycardia Blood pressure noted to be in the hypertensive range.  No previous history of hypertension.  -Now on spironolactone per Cardiology -BP stable at present, cont current regimen  Hypokalemia and hypomagnesemia -Remains low, will replace both -repeat lytes in AM  Right adnexal cyst -Incidentally noted on CT scan.   -Recommend close outpatient follow up   DVT prophylaxis: SCD's Code Status: Full Family Communication: Pt in room, family not at bedside  Status is: Inpatient  Remains inpatient appropriate because:IV treatments appropriate due to intensity of illness or inability to take PO and Inpatient level of care appropriate due to severity of illness   Dispo:  Patient From: Home  Planned Disposition: Home  Medically stable for discharge: No    Consultants:   Cardiology  Procedures:     Antimicrobials: Anti-infectives (From admission, onward)   None      Subjective: States feeling better. Reports LE swelling improved  Objective: Vitals:   07/16/20 1354 07/16/20 2126 07/17/20 0439 07/17/20 1153  BP: 128/82 128/84 130/87   Pulse: (!) 107 (!) 108 94   Resp: 19 18 15    Temp: 98.3 F (36.8 C) 98.3 F (36.8 C) 98 F (36.7 C)   TempSrc: Oral Oral Oral   SpO2: 100% 100% 100% 97%  Weight:   60.3 kg   Height:        Intake/Output Summary (Last 24 hours) at 07/17/2020 1208 Last data filed at 07/17/2020 1030 Gross per  24 hour  Intake 940 ml  Output 1150 ml  Net -210 ml   [employer]   07/15/20 0500 07/16/20 0630 07/17/20 0439  Weight: 63.5 kg 61.4 kg 60.3 kg    Examination: General exam: Awake, laying in bed, in nad Respiratory system: Normal respiratory effort, no wheezing Cardiovascular system:  regular rate, s1, s2 Gastrointestinal system: Soft, nondistended, positive BS Central nervous system: CN2-12 grossly intact, strength intact Extremities: Perfused, no clubbing, LE edema improved Skin: Normal skin turgor, no notable skin lesions seen Psychiatry: Mood normal // no visual hallucinations   Data Reviewed: I have personally reviewed following labs and imaging studies  CBC: Recent Labs  Lab 07/13/20 1452 07/14/20 1211 07/15/20 0430 07/16/20 0415 07/17/20 0538  WBC 7.9 8.9 8.5 9.3 7.6  NEUTROABS 5.5  --   --   --   --   HGB 3.5* 8.2* 8.2* 8.8* 9.8*  HCT 14.0* 27.2* 27.8* 31.2* 35.1*  MCV 54.7* 67.2* 68.0* 70.0* 70.1*  PLT 556* 465* 427* 395 378*   Basic Metabolic Panel: Recent Labs  Lab 07/13/20 1452 07/14/20 1211 07/15/20 0430 07/16/20 0415 07/17/20 0538  NA 142 139 142 142 138  K 3.0* 3.4* 3.4* 4.2 3.3*  CL 109 111 108 111 104  CO2 20* 19* 23 24 25   GLUCOSE 104* 96 105* 98 101*  BUN 8 7 10 10 11   CREATININE 0.64 0.42* 0.68 0.58 0.52  CALCIUM 8.1* 8.3* 8.6* 8.6* 8.8*  MG  --  1.7 1.7  --   --    GFR: Estimated Creatinine Clearance: 72.8 mL/min (by C-G formula based on SCr of 0.52 mg/dL). Liver Function Tests: Recent Labs  Lab 07/13/20 1452 07/14/20 1211 07/15/20 0430 07/16/20 0415 07/17/20 0538  AST 26 28 24 22 30   ALT 24 25 24 21 27   ALKPHOS 216* 193* 173* 147* 139*  BILITOT 0.9 1.1 1.2 0.8 1.1  PROT 7.1 7.3 7.1 6.5 6.9  ALBUMIN 3.5 3.6 3.4* 3.1* 3.3*   No results for input(s): LIPASE, AMYLASE in the last 168 hours. No results for input(s): AMMONIA in the last 168 hours. Coagulation Profile: Recent Labs  Lab 07/13/20 1756  INR 1.4*   Cardiac Enzymes: No results for input(s): CKTOTAL, CKMB, CKMBINDEX, TROPONINI in the last 168 hours. BNP (last 3 results) No results for input(s): PROBNP in the last 8760 hours. HbA1C: No results for input(s): HGBA1C in the last 72 hours. CBG: No results for input(s): GLUCAP in the last 168  hours. Lipid Profile: No results for input(s): CHOL, HDL, LDLCALC, TRIG, CHOLHDL, LDLDIRECT in the last 72 hours. Thyroid Function Tests: No results for input(s): TSH, T4TOTAL, FREET4, T3FREE, THYROIDAB in the last 72 hours. Anemia Panel: No results for input(s): VITAMINB12, FOLATE, FERRITIN, TIBC, IRON, RETICCTPCT in the last 72 hours. Sepsis Labs: No results for input(s): PROCALCITON, LATICACIDVEN in the last 168 hours.  Recent Results (from the past 240 hour(s))  Resp Panel by RT-PCR (Flu A&B, Covid) Nasopharyngeal Swab     Status: None   Collection Time: 07/13/20  5:27 PM   Specimen: Nasopharyngeal Swab; Nasopharyngeal(NP) swabs in vial transport medium  Result Value Ref Range Status   SARS Coronavirus 2 by RT PCR NEGATIVE NEGATIVE Final    Comment: (NOTE) SARS-CoV-2 target nucleic acids are NOT DETECTED.  The SARS-CoV-2 RNA is generally detectable in upper respiratory specimens during the acute phase of infection. The lowest concentration of SARS-CoV-2 viral copies this assay can detect is 138 copies/mL. A negative result does not  preclude SARS-Cov-2 infection and should not be used as the sole basis for treatment or other patient management decisions. A negative result may occur with  improper specimen collection/handling, submission of specimen other than nasopharyngeal swab, presence of viral mutation(s) within the areas targeted by this assay, and inadequate number of viral copies(<138 copies/mL). A negative result must be combined with clinical observations, patient history, and epidemiological information. The expected result is Negative.  Fact Sheet for Patients:  EntrepreneurPulse.com.au  Fact Sheet for Healthcare Providers:  IncredibleEmployment.be  This test is no t yet approved or cleared by the [place] FDA and  has been authorized for detection and/or diagnosis of SARS-CoV-2 by FDA under an Emergency Use Authorization  (EUA). This EUA will remain  in effect (meaning this test can be used) for the duration of the COVID-19 declaration under Section 564(b)(1) of the Act, 21 U.S.C.section 360bbb-3(b)(1), unless the authorization is terminated  or revoked sooner.       Influenza A by PCR NEGATIVE NEGATIVE Final   Influenza B by PCR NEGATIVE NEGATIVE Final    Comment: (NOTE) The Xpert Xpress SARS-CoV-2/FLU/RSV plus assay is intended as an aid in the diagnosis of influenza from Nasopharyngeal swab specimens and should not be used as a sole basis for treatment. Nasal washings and aspirates are unacceptable for Xpert Xpress SARS-CoV-2/FLU/RSV testing.  Fact Sheet for Patients: EntrepreneurPulse.com.au  Fact Sheet for Healthcare Providers: IncredibleEmployment.be  This test is not yet approved or cleared by the [place] FDA and has been authorized for detection and/or diagnosis of SARS-CoV-2 by FDA under an Emergency Use Authorization (EUA). This EUA will remain in effect (meaning this test can be used) for the duration of the COVID-19 declaration under Section 564(b)(1) of the Act, 21 U.S.C. section 360bbb-3(b)(1), unless the authorization is terminated or revoked.  Performed at [facility], [place] [address], [place], [place] [address]      Radiology Studies: No results found.  Scheduled Meds: . sodium chloride   Intravenous Once  . carvedilol  12.5 mg Oral BID WC  . cetirizine  10 mg Oral Daily  . pantoprazole (PROTONIX) IV  40 mg Intravenous Q12H  . sacubitril-valsartan  1 tablet Oral BID  . spironolactone  25 mg Oral Daily   Continuous Infusions:   LOS: 4 days   Marylu Lund, MD [facility] Pager On Amion  If 7PM-7AM, please contact night-coverage 07/17/2020, 12:08 PM

## 2020-07-17 NOTE — Progress Notes (Signed)
Progress Note  Patient Name: Kelly Guzman Date of Encounter: 07/17/2020  [facility] Cardiologist: Mertie Moores, MD   Subjective   Breathing much better, still with some lower extremity edema. HR and BP are better   I dont think the I/O are accurate.   Wt is down 3 kg over past several days  Will DC IV lasix and Kdur Start spironolactone 25 mg a day  Cont entresto Increase coreg for her tachycardia    Inpatient Medications    Scheduled Meds: . sodium chloride   Intravenous Once  . carvedilol  6.25 mg Oral BID WC  . cetirizine  10 mg Oral Daily  . furosemide  60 mg Intravenous BID  . pantoprazole (PROTONIX) IV  40 mg Intravenous Q12H  . potassium chloride  40 mEq Oral BID  . sacubitril-valsartan  1 tablet Oral BID   Continuous Infusions:  PRN Meds: acetaminophen **OR** acetaminophen, lip balm, menthol-cetylpyridinium, metoprolol tartrate, ondansetron **OR** ondansetron (ZOFRAN) IV, phenol   Vital Signs    Vitals:   07/16/20 0630 07/16/20 1354 07/16/20 2126 07/17/20 0439  BP: (!) 149/111 128/82 128/84 130/87  Pulse: 98 (!) 107 (!) 108 94  Resp: 18 19 18 15   Temp: 98.3 F (36.8 C) 98.3 F (36.8 C) 98.3 F (36.8 C) 98 F (36.7 C)  TempSrc: Oral Oral Oral Oral  SpO2: 100% 100% 100% 100%  Weight: 61.4 kg   60.3 kg  Height:        Intake/Output Summary (Last 24 hours) at 07/17/2020 0936 Last data filed at 07/17/2020 0732 Gross per 24 hour  Intake 700 ml  Output 1050 ml  Net -350 ml   Last 3 Weights 07/17/2020 07/16/2020 07/15/2020  Weight (lbs) 132 lb 15 oz 135 lb 5.8 oz 139 lb 15.9 oz  Weight (kg) 60.3 kg 61.4 kg 63.5 kg      Telemetry    Sinus rhythm  Personally Reviewed  ECG    None today - Personally Reviewed  Physical Exam   Physical Exam: Blood pressure 130/87, pulse 94, temperature 98 F (36.7 C), temperature source Oral, resp. rate 15, height 5' (1.524 m), weight 60.3 kg, SpO2 100 %.  GEN:  Well nourished, well developed in no acute  distress HEENT: Normal NECK: No JVD; No carotid bruits LYMPHATICS: No lymphadenopathy CARDIAC: RRR , soft systolic mumur RESPIRATORY:  Clear to auscultation without rales, wheezing or rhonchi  ABDOMEN: Soft, non-tender, non-distended MUSCULOSKELETAL:  No edema; No deformity  SKIN: Warm and dry NEUROLOGIC:  Alert and oriented x 3   Labs    High Sensitivity Troponin:  No results for input(s): TROPONINIHS in the last 720 hours.    Chemistry Recent Labs  Lab 07/15/20 0430 07/16/20 0415 07/17/20 0538  NA 142 142 138  K 3.4* 4.2 3.3*  CL 108 111 104  CO2 23 24 25   GLUCOSE 105* 98 101*  BUN 10 10 11   CREATININE 0.68 0.58 0.52  CALCIUM 8.6* 8.6* 8.8*  PROT 7.1 6.5 6.9  ALBUMIN 3.4* 3.1* 3.3*  AST 24 22 30   ALT 24 21 27   ALKPHOS 173* 147* 139*  BILITOT 1.2 0.8 1.1  GFRNONAA >60 >60 >60  ANIONGAP 11 7 9      Hematology Recent Labs  Lab 07/15/20 0430 07/16/20 0415 07/17/20 0538  WBC 8.5 9.3 7.6  RBC 4.09 4.46 5.01  HGB 8.2* 8.8* 9.8*  HCT 27.8* 31.2* 35.1*  MCV 68.0* 70.0* 70.1*  MCH 20.0* 19.7* 19.6*  MCHC 29.5* 28.2* 27.9*  RDW 33.1* Not Measured Not Measured  PLT 427* 395 407*    BNP Recent Labs  Lab 07/14/20 1211  BNP 1,133.9*    Magnesium  Date Value Ref Range Status  07/15/2020 1.7 1.7 - 2.4 mg/dL Final    Comment:    Performed at [facility], [place] [address] [place], [place] [address]  07/14/2020 1.7 1.7 - 2.4 mg/dL Final    Comment:    Performed at [facility], [place] [address] [place], [place] [address]   Lab Results  Component Value Date   TSH 2.782 07/13/2020    DDimer No results for input(s): DDIMER in the last 168 hours.   Radiology    No results found.  Cardiac Studies   ECHO: 07/14/2020 1. Left ventricular ejection fraction, by estimation, is 35 to 40%. The  left ventricle has moderately decreased function. The left ventricle  demonstrates global hypokinesis. Indeterminate diastolic  filling due to  E-A fusion. There is the interventricular septum is flattened in diastole ('D' shaped left ventricle), consistent with right ventricular volume overload.  2. Right ventricular systolic function is moderately reduced. The right  ventricular size is mildly enlarged. There is moderately elevated  pulmonary artery systolic pressure w/ RVSP 51.7 mmHg.  3. Left atrial size was mildly dilated.  4. Right atrial size was mild to moderately dilated.  5. The pericardial effusion is circumferential. Large pleural effusion in  the left lateral region.  6. The mitral valve is normal in structure. Mild to moderate mitral valve  regurgitation.  7. Tricuspid valve regurgitation is moderate to severe.  8. The aortic valve is tricuspid. Aortic valve regurgitation is not  visualized.  9. The inferior vena cava is dilated in size with <50% respiratory  variability, suggesting right atrial pressure of 15 mmHg.   Patient Profile     45 y.o. female w/ hx HTN (intol mult meds), chronic IDA req transfusion 07/10/2020, pre-eclampsia, who was admitted 04/04 with DOE, LE edema, EF 35-40% on echo.  Assessment & Plan    1. Acute systolic CHF w/ biventricular failure:  Will stop IV lasix,  She appears euvolumic now,  Dc kdur Start spironolactone 25 mg a day .   She may need additional lasix ,  Will need some follow up to assess Increase coreg to 12.5 bid Cont entresto    2. Mild-mod MR & mod-severe TR    3. Severe IDA felt 2nd menorrhagia  further management per GYN and primary team   4. HTN- improving      For questions or updates, please contact [place] Please consult [email or web address] for contact info under       Signed, Mertie Moores, MD  07/17/2020, 9:36 AM

## 2020-07-18 DIAGNOSIS — I5043 Acute on chronic combined systolic (congestive) and diastolic (congestive) heart failure: Secondary | ICD-10-CM | POA: Diagnosis not present

## 2020-07-18 LAB — COMPREHENSIVE METABOLIC PANEL
ALT: 30 U/L (ref 0–44)
AST: 31 U/L (ref 15–41)
Albumin: 3.1 g/dL — ABNORMAL LOW (ref 3.5–5.0)
Alkaline Phosphatase: 127 U/L — ABNORMAL HIGH (ref 38–126)
Anion gap: 8 (ref 5–15)
BUN: 15 mg/dL (ref 6–20)
CO2: 23 mmol/L (ref 22–32)
Calcium: 8.5 mg/dL — ABNORMAL LOW (ref 8.9–10.3)
Chloride: 105 mmol/L (ref 98–111)
Creatinine, Ser: 0.57 mg/dL (ref 0.44–1.00)
GFR, Estimated: >60 mL/min (ref >60)
Glucose, Bld: 107 mg/dL — ABNORMAL HIGH (ref 70–99)
Potassium: 3.8 mmol/L (ref 3.5–5.1)
Sodium: 136 mmol/L (ref 135–145)
Total Bilirubin: 0.8 mg/dL (ref 0.3–1.2)
Total Protein: 6.6 g/dL (ref 6.5–8.1)

## 2020-07-18 LAB — CBC
HCT: 35.6 % — ABNORMAL LOW (ref 36.0–46.0)
Hemoglobin: 9.8 g/dL — ABNORMAL LOW (ref 12.0–15.0)
MCH: 19.8 pg — ABNORMAL LOW (ref 26.0–34.0)
MCHC: 27.5 g/dL — ABNORMAL LOW (ref 30.0–36.0)
MCV: 72.1 fL — ABNORMAL LOW (ref 80.0–100.0)
Platelets: 372 10*3/uL (ref 150–400)
RBC: 4.94 MIL/uL (ref 3.87–5.11)
WBC: 7.4 10*3/uL (ref 4.0–10.5)
nRBC: 0.3 % — ABNORMAL HIGH (ref 0.0–0.2)

## 2020-07-18 MED ORDER — CARVEDILOL 25 MG PO TABS: 25.0000 mg | ORAL_TABLET | Freq: Two times a day (BID) | ORAL | 0 refills | Status: DC

## 2020-07-18 MED ORDER — CARVEDILOL 25 MG PO TABS
25.0000 mg | ORAL_TABLET | Freq: Two times a day (BID) | ORAL | Status: DC
  Administered 2020-07-18: 25 mg via ORAL
  Filled 2020-07-18: qty 1

## 2020-07-18 MED ORDER — SACUBITRIL-VALSARTAN 49-51 MG PO TABS: 1.0000 | ORAL_TABLET | Freq: Two times a day (BID) | ORAL | 0 refills | Status: DC

## 2020-07-18 MED ORDER — SPIRONOLACTONE 25 MG PO TABS: 25.0000 mg | ORAL_TABLET | Freq: Every day | ORAL | 0 refills | Status: DC

## 2020-07-18 MED ORDER — LOSARTAN POTASSIUM 50 MG PO TABS: 50.0000 mg | ORAL_TABLET | Freq: Every day | ORAL | 0 refills | Status: DC

## 2020-07-18 NOTE — Discharge Summary (Addendum)
Physician Discharge Summary  Jamika Sadek UKG:[id number] DOB: 04/29/1975 DOA: 07/13/2020  PCP: Nolene Ebbs, MD  Admit date: 07/13/2020 Discharge date: 07/18/2020  Admitted From: Home Disposition:  Home  Recommendations for Outpatient Follow-up:  Follow up with PCP in 2-3 weeks Follow up with Cardiology as scheduled Follow up with GYN as scheduled  Discharge Condition:Improved CODE STATUS:Full Diet recommendation: Heart healthy   Brief/Interim Summary: 45 year old African-American female with no significant past medical history except for longstanding anemia presented with hemoglobin of 3.5. She had previously declined hospitalization. Was seen by Dr. Lorenso Courier at the cancer clinic on 4/4 and was directly admitted. Thought to be secondary to chronic loss from GYN issues.  Discharge Diagnoses:  Active Problems:   Symptomatic anemia   Pedal edema   Tachycardia   Iron deficiency   Menorrhagia   Acute on chronic combined systolic and diastolic CHF (congestive heart failure) (HCC)  Symptomatic anemia/iron deficiency/chronic blood loss anemia Patient's anemia appears to be primarily longstanding and likely due to longstanding menorrhagia. CT scan of the abdomen pelvis with findings of uterine fibroids which could be the reason for her menorrhagia. She will need to eventually see a GYN.  -Pt is now s/p 3 units PRBC's since presentation -Pt had reported minimal spotting although hgb had trended up to 9.8 -Pt recommended to f/u closely with GYN as outpatient  Menorrhagia/uterine fibroids This has been a longstanding issue. CT scan showed uterine fibroids.  -Recommend f/u with GYN when discharged per above  GI bleed ruled out, dark stool secondary to iron Patient mention black-colored stool recently -Stools noted to be heme neg  Dyspnea/pedal edema/cardiomegaly/pleural effusion/small pericardial effusion with acute systolic chf, chronicity is unknown. New diagnosis -2d  echo with EF of 35-40% with evidence of volume overload -Cardiology following -Pt was given IV lasix with good diuresis -Spironolactone ordered per Cardiology with coreg dose increased at time of d/c -Cardiology had recommended entresto, however insurance did not cover. Discussed with Cardiology, who recommended low dose ARB on d/c. Have prescribed cozaar on d/c. Pt to f/u with Cardiology  Abdominal discomfort/hepatomegaly CT scan of the abdomen pelvis did show small amount of free fluid in the abdomen and pelvis with mesenteric edema.  -Findings suggestive of passive hepatic congestion noted  Elevated blood pressure/sinus tachycardia Blood pressure noted to be in the hypertensive range. No previous history of hypertension.  -Now on spironolactone per Cardiology with coreg and entresto  Hypokalemia and hypomagnesemia -Replaced  Right adnexal cyst -Incidentally noted on CT scan.  -Recommend close outpatient follow up with GYN  Discharge Instructions   Allergies as of 07/18/2020      Reactions   Penicillins Rash      Medication List    STOP taking these medications   furosemide 20 MG tablet Commonly known as: LASIX     TAKE these medications   carvedilol 25 MG tablet Commonly known as: COREG Take 1 tablet (25 mg total) by mouth 2 (two) times daily with a meal.   cetirizine 10 MG tablet Commonly known as: ZYRTEC Take 10 mg by mouth daily.   FUSION PLUS PO Take 1 capsule by mouth daily.   losartan 50 MG tablet Commonly known as: Cozaar Take 1 tablet (50 mg total) by mouth daily.   spironolactone 25 MG tablet Commonly known as: ALDACTONE Take 1 tablet (25 mg total) by mouth daily. Start taking on: July 19, 2020       Follow-up Information    Nahser, Wonda Cheng, MD Follow up.  Specialty: Cardiology Why: the office will call for follow up appt.  if you have not heard from office by Tuesday please call the office.  Contact information: [address] [address] [phone]        Nolene Ebbs, MD. Schedule an appointment as soon as possible for a visit in 2 week(s).   Specialty: Internal Medicine Contact information: [place] [address] [phone]        [place]. Schedule an appointment as soon as possible for a visit.   Contact information: [address], [place] [id number] [place] [phone]             Allergies  Allergen Reactions  . Penicillins Rash    Consultations:  Cardiology  Procedures/Studies: DG Chest 2 View  Result Date: 07/14/2020 CLINICAL DATA:  Shortness of breath. EXAM: CHEST - 2 VIEW COMPARISON:  No prior. FINDINGS: Cardiomegaly. Bibasilar atelectasis and infiltrates/edema. Small bilateral pleural effusions. Findings most consistent with CHF. Bibasilar pneumonia cannot be excluded. No pneumothorax. IMPRESSION: Cardiomegaly. Bibasilar atelectasis and infiltrates/edema. Small bilateral pleural effusions. Findings most consistent CHF. Electronically Signed   By: Marcello Moores  Register   On: 07/14/2020 12:44   CT ABDOMEN PELVIS W CONTRAST  Result Date: 07/14/2020 CLINICAL DATA:  Nausea and diffuse abdominal pain. EXAM: CT ABDOMEN AND PELVIS WITH CONTRAST TECHNIQUE: Multidetector CT imaging of the abdomen and pelvis was performed using the standard protocol following bolus administration of intravenous contrast. CONTRAST:  139mL OMNIPAQUE IOHEXOL 300 MG/ML  SOLN COMPARISON:  None. FINDINGS: Lower chest: Moderate bilateral pleural effusions with basilar atelectasis. Cardiac enlargement with small pericardial effusion. Hepatobiliary: Diffuse enlargement of the liver. Heterogeneous poorly defined low-attenuation throughout the liver. Changes may represent a combination of passive hepatic congestion and heterogeneous fatty infiltration. Pericholecystic fluid is nonspecific but likely related to liver  disease. No bile duct dilatation. Pancreas: Unremarkable. No pancreatic ductal dilatation or surrounding inflammatory changes. Spleen: Normal in size without focal abnormality. Adrenals/Urinary Tract: Adrenal glands are unremarkable. Kidneys are normal, without renal calculi, focal lesion, or hydronephrosis. Bladder is unremarkable. Stomach/Bowel: Stomach, small bowel, and colon are not abnormally distended. No wall thickening or inflammatory changes are appreciated. Appendix is normal. Vascular/Lymphatic: No significant vascular findings are present. No enlarged abdominal or pelvic lymph nodes. Reproductive: Heterogeneous nodular enlargement of the uterus consistent with uterine fibroids. Largest measures 3.8 cm diameter. Right adnexal cyst measuring 5 cm diameter. Other: Small amount of free fluid in the abdomen and pelvis with mesenteric edema, likely reactive or related to liver disease. Musculoskeletal: No acute or significant osseous findings. IMPRESSION: 1. Diffuse enlargement of the liver with poorly defined low-attenuation changes, likely to represent a combination of passive hepatic congestion and heterogeneous fatty infiltration. Hepatitis would also be a possibility. 2. Moderate bilateral pleural effusions with basilar atelectasis. 3. Cardiac enlargement with small pericardial effusion. 4. Small amount of free fluid in the abdomen and pelvis with mesenteric edema, likely reactive or related to liver disease. 5. Uterine fibroids. 6. Right adnexal cyst measuring 5 cm diameter. No follow-up imaging recommended. Note: This recommendation does not apply to premenarchal patients and to those with increased risk (genetic, family history, elevated tumor markers or other high-risk factors) of ovarian cancer. Reference: JACR 2020 Feb; 17(2):248-254 Electronically Signed   By: Lucienne Capers M.D.   On: 07/14/2020 02:03   ECHOCARDIOGRAM COMPLETE  Result Date: 07/14/2020    ECHOCARDIOGRAM REPORT   Patient Name:  Antonietta Breach Date of Exam: 07/14/2020 Medical Rec #:  [id number]          Height:       60.0 in Accession #:    [id number]         Weight:       140.1 lb Date of Birth:  Dec 17, 1975          BSA:          1.604 m Patient Age:    56 years           BP:           155/111 mmHg Patient Gender: F                  HR:           118 bpm. Exam Location:  Inpatient Procedure: 2D Echo Indications:    Dyspnea R06.00  History:        Patient has no prior history of Echocardiogram examinations.  Sonographer:    Mikki Santee RDCS (AE) Referring Phys: [place]  1. Left ventricular ejection fraction, by estimation, is 35 to 40%. The left ventricle has moderately decreased function. The left ventricle demonstrates global hypokinesis. Indeterminate diastolic filling due to E-A fusion. There is the interventricular septum is flattened in diastole ('D' shaped left ventricle), consistent with right ventricular volume overload.  2. Right ventricular systolic function is moderately reduced. The right ventricular size is mildly enlarged. There is moderately elevated pulmonary artery systolic pressure.  3. Left atrial size was mildly dilated.  4. Right atrial size was mild to moderately dilated.  5. The pericardial effusion is circumferential. Large pleural effusion in the left lateral region.  6. The mitral valve is normal in structure. Mild to moderate mitral valve regurgitation.  7. Tricuspid valve regurgitation is moderate to severe.  8. The aortic valve is tricuspid. Aortic valve regurgitation is not visualized.  9. The inferior vena cava is dilated in size with <50% respiratory variability, suggesting right atrial pressure of 15 mmHg. FINDINGS  Left Ventricle: Left ventricular ejection fraction, by estimation, is 35 to 40%. The left ventricle has moderately decreased function. The left ventricle demonstrates global hypokinesis. The left ventricular internal cavity size was normal in size. There is no  left ventricular hypertrophy. The interventricular septum is flattened in diastole ('D' shaped left ventricle), consistent with right ventricular volume overload. Indeterminate diastolic filling due to E-A fusion. Right Ventricle: The right ventricular size is mildly enlarged. No increase in right ventricular wall thickness. Right ventricular systolic function is moderately reduced. There is moderately elevated pulmonary artery systolic pressure. The tricuspid regurgitant velocity is 3.03 m/s, and with an assumed right atrial pressure of 15 mmHg, the estimated right ventricular systolic pressure is 78.4 mmHg. Left Atrium: Left atrial size was mildly dilated. Right Atrium: Right atrial size was mild to moderately dilated. Pericardium: Trivial pericardial effusion is present. The pericardial effusion is circumferential. Mitral Valve: The mitral valve is normal in structure. Mild to moderate mitral valve regurgitation, with centrally-directed jet. Tricuspid Valve: The tricuspid valve is normal in structure. Tricuspid valve regurgitation is moderate to severe. Aortic Valve: The aortic valve is tricuspid. Aortic valve regurgitation is not visualized. Pulmonic Valve: The pulmonic valve was normal in structure. Pulmonic valve regurgitation is not visualized. Aorta: The aortic root and ascending aorta are structurally normal, with no evidence of dilitation. Venous: The inferior vena cava is dilated in size with less than 50% respiratory variability, suggesting right  atrial pressure of 15 mmHg. IAS/Shunts: No atrial level shunt detected by color flow Doppler. Additional Comments: There is a large pleural effusion in the left lateral region.  LEFT VENTRICLE PLAX 2D LVIDd:         4.90 cm     Diastology LVIDs:         4.00 cm     LV e' medial:    11.70 cm/s LV PW:         1.00 cm     LV E/e' medial:  11.2 LV IVS:        1.10 cm     LV e' lateral:   19.50 cm/s LVOT diam:     1.90 cm     LV E/e' lateral: 6.7 LV SV:         39 LV  SV Index:   24 LVOT Area:     2.84 cm  LV Volumes (MOD) LV vol d, MOD A2C: 99.1 ml LV vol d, MOD A4C: 84.5 ml LV vol s, MOD A2C: 64.8 ml LV vol s, MOD A4C: 52.0 ml LV SV MOD A2C:     34.3 ml LV SV MOD A4C:     84.5 ml LV SV MOD BP:      34.7 ml RIGHT VENTRICLE RV S prime:     700.00 cm/s TAPSE (M-mode): 1.6 cm LEFT ATRIUM             Index       RIGHT ATRIUM           Index LA diam:        3.70 cm 2.31 cm/m  RA Area:     20.00 cm LA Vol (A2C):   79.2 ml 49.36 ml/m RA Volume:   52.00 ml  32.41 ml/m LA Vol (A4C):   47.7 ml 29.73 ml/m LA Biplane Vol: 63.7 ml 39.70 ml/m  AORTIC VALVE LVOT Vmax:   101.00 cm/s LVOT Vmean:  65.100 cm/s LVOT VTI:    0.137 m  AORTA Ao Root diam: 2.40 cm MITRAL VALVE                TRICUSPID VALVE MV Area (PHT): 4.96 cm     TR Peak grad:   36.7 mmHg MV Decel Time: 153 msec     TR Vmax:        303.00 cm/s MV E velocity: 131.00 cm/s MV A velocity: 68.30 cm/s   SHUNTS MV E/A ratio:  1.92         Systemic VTI:  0.14 m                             Systemic Diam: 1.90 cm Dani Gobble Croitoru MD Electronically signed by Sanda Klein MD Signature Date/Time: 07/14/2020/4:18:59 PM    Final    VAS [place] LOWER EXTREMITY VENOUS (DVT)  Result Date: 07/17/2020  Lower Venous DVT Study Indications: RLE cramping.  Comparison Study: no previous exams Performing Technologist: Rogelia Rohrer  Examination Guidelines: A complete evaluation includes B-mode imaging, spectral Doppler, color Doppler, and power Doppler as needed of all accessible portions of each vessel. Bilateral testing is considered an integral part of a complete examination. Limited examinations for reoccurring indications may be performed as noted. The reflux portion of the exam is performed with the patient in reverse Trendelenburg.  +-----+---------------+---------+-----------+----------+--------------+ RIGHTCompressibilityPhasicitySpontaneityPropertiesThrombus Aging +-----+---------------+---------+-----------+----------+--------------+ CFV   Full           Yes  Yes                                 +-----+---------------+---------+-----------+----------+--------------+   +---------+---------------+---------+-----------+----------+--------------+ LEFT     CompressibilityPhasicitySpontaneityPropertiesThrombus Aging +---------+---------------+---------+-----------+----------+--------------+ CFV      Full           Yes      Yes                                 +---------+---------------+---------+-----------+----------+--------------+ SFJ      Full                                                        +---------+---------------+---------+-----------+----------+--------------+ FV Prox  Full           Yes      Yes                                 +---------+---------------+---------+-----------+----------+--------------+ FV Mid   Full           Yes      Yes                                 +---------+---------------+---------+-----------+----------+--------------+ FV DistalFull           Yes      Yes                                 +---------+---------------+---------+-----------+----------+--------------+ PFV      Full                                                        +---------+---------------+---------+-----------+----------+--------------+ POP      Full           Yes      Yes                                 +---------+---------------+---------+-----------+----------+--------------+ PTV      Full                                                        +---------+---------------+---------+-----------+----------+--------------+ PERO     Full                                                        +---------+---------------+---------+-----------+----------+--------------+     Summary: RIGHT: - There is no evidence of deep vein thrombosis in the lower extremity. - There is no evidence of superficial venous thrombosis.  -  No cystic structure found in the popliteal fossa.  LEFT: - No  evidence of common femoral vein obstruction.  *See table(s) above for measurements and observations. Electronically signed by Deitra Mayo MD on 07/17/2020 at 6:39:16 PM.    Final     Subjective: Without complaints  Discharge Exam: Vitals:   07/17/20 2059 07/18/20 0557  BP: 115/71 132/85  Pulse: 98 94  Resp: 17 17  Temp: 98.3 F (36.8 C) 98 F (36.7 C)  SpO2: 99% 100%   Vitals:   07/17/20 1153 07/17/20 1419 07/17/20 2059 07/18/20 0557  BP:  112/78 115/71 132/85  Pulse:  100 98 94  Resp:  16 17 17   Temp:  98.5 F (36.9 C) 98.3 F (36.8 C) 98 F (36.7 C)  TempSrc:  Oral Oral Oral  SpO2: 97% 99% 99% 100%  Weight:    57.4 kg  Height:        General: Pt is alert, awake, not in acute distress Cardiovascular: RRR, S1/S2 +, no rubs, no gallops Respiratory: CTA bilaterally, no wheezing, no rhonchi Abdominal: Soft, NT, ND, bowel sounds + Extremities: no edema, no cyanosis   The results of significant diagnostics from this hospitalization (including imaging, microbiology, ancillary and laboratory) are listed below for reference.     Microbiology: Recent Results (from the past 240 hour(s))  Resp Panel by RT-PCR (Flu A&B, Covid) Nasopharyngeal Swab     Status: None   Collection Time: 07/13/20  5:27 PM   Specimen: Nasopharyngeal Swab; Nasopharyngeal(NP) swabs in vial transport medium  Result Value Ref Range Status   SARS Coronavirus 2 by RT PCR NEGATIVE NEGATIVE Final    Comment: (NOTE) SARS-CoV-2 target nucleic acids are NOT DETECTED.  The SARS-CoV-2 RNA is generally detectable in upper respiratory specimens during the acute phase of infection. The lowest concentration of SARS-CoV-2 viral copies this assay can detect is 138 copies/mL. A negative result does not preclude SARS-Cov-2 infection and should not be used as the sole basis for treatment or other patient management decisions. A negative result may occur with  improper specimen collection/handling, submission  of specimen other than nasopharyngeal swab, presence of viral mutation(s) within the areas targeted by this assay, and inadequate number of viral copies(<138 copies/mL). A negative result must be combined with clinical observations, patient history, and epidemiological information. The expected result is Negative.  Fact Sheet for Patients:  EntrepreneurPulse.com.au  Fact Sheet for Healthcare Providers:  IncredibleEmployment.be  This test is no t yet approved or cleared by the [place] FDA and  has been authorized for detection and/or diagnosis of SARS-CoV-2 by FDA under an Emergency Use Authorization (EUA). This EUA will remain  in effect (meaning this test can be used) for the duration of the COVID-19 declaration under Section 564(b)(1) of the Act, 21 U.S.C.section 360bbb-3(b)(1), unless the authorization is terminated  or revoked sooner.       Influenza A by PCR NEGATIVE NEGATIVE Final   Influenza B by PCR NEGATIVE NEGATIVE Final    Comment: (NOTE) The Xpert Xpress SARS-CoV-2/FLU/RSV plus assay is intended as an aid in the diagnosis of influenza from Nasopharyngeal swab specimens and should not be used as a sole basis for treatment. Nasal washings and aspirates are unacceptable for Xpert Xpress SARS-CoV-2/FLU/RSV testing.  Fact Sheet for Patients: EntrepreneurPulse.com.au  Fact Sheet for Healthcare Providers: IncredibleEmployment.be  This test is not yet approved or cleared by the [place] FDA and has been authorized for detection and/or diagnosis of SARS-CoV-2 by FDA under an Emergency  Use Authorization (EUA). This EUA will remain in effect (meaning this test can be used) for the duration of the COVID-19 declaration under Section 564(b)(1) of the Act, 21 U.S.C. section 360bbb-3(b)(1), unless the authorization is terminated or revoked.  Performed at [facility], [place]  [address] [place], [place] [address]      Labs: BNP (last 3 results) Recent Labs    07/14/20 1211  BNP 6,381.7*   Basic Metabolic Panel: Recent Labs  Lab 07/14/20 1211 07/15/20 0430 07/16/20 0415 07/17/20 0538 07/18/20 0458  NA 139 142 142 138 136  K 3.4* 3.4* 4.2 3.3* 3.8  CL 111 108 111 104 105  CO2 19* 23 24 25 23   GLUCOSE 96 105* 98 101* 107*  BUN 7 10 10 11 15   CREATININE 0.42* 0.68 0.58 0.52 0.57  CALCIUM 8.3* 8.6* 8.6* 8.8* 8.5*  MG 1.7 1.7  --   --   --    Liver Function Tests: Recent Labs  Lab 07/14/20 1211 07/15/20 0430 07/16/20 0415 07/17/20 0538 07/18/20 0458  AST 28 24 22 30 31   ALT 25 24 21 27 30   ALKPHOS 193* 173* 147* 139* 127*  BILITOT 1.1 1.2 0.8 1.1 0.8  PROT 7.3 7.1 6.5 6.9 6.6  ALBUMIN 3.6 3.4* 3.1* 3.3* 3.1*   No results for input(s): LIPASE, AMYLASE in the last 168 hours. No results for input(s): AMMONIA in the last 168 hours. CBC: Recent Labs  Lab 07/13/20 1452 07/14/20 1211 07/15/20 0430 07/16/20 0415 07/17/20 0538 07/18/20 0458  WBC 7.9 8.9 8.5 9.3 7.6 7.4  NEUTROABS 5.5  --   --   --   --   --   HGB 3.5* 8.2* 8.2* 8.8* 9.8* 9.8*  HCT 14.0* 27.2* 27.8* 31.2* 35.1* 35.6*  MCV 54.7* 67.2* 68.0* 70.0* 70.1* 72.1*  PLT 556* 465* 427* 395 407* 372   Cardiac Enzymes: No results for input(s): CKTOTAL, CKMB, CKMBINDEX, TROPONINI in the last 168 hours. BNP: Invalid input(s): POCBNP CBG: No results for input(s): GLUCAP in the last 168 hours. D-Dimer No results for input(s): DDIMER in the last 72 hours. Hgb A1c No results for input(s): HGBA1C in the last 72 hours. Lipid Profile No results for input(s): CHOL, HDL, LDLCALC, TRIG, CHOLHDL, LDLDIRECT in the last 72 hours. Thyroid function studies No results for input(s): TSH, T4TOTAL, T3FREE, THYROIDAB in the last 72 hours.  Invalid input(s): FREET3 Anemia work up No results for input(s): VITAMINB12, FOLATE, FERRITIN, TIBC, IRON, RETICCTPCT in the last 72 hours. Urinalysis     Component Value Date/Time   COLORURINE YELLOW 07/13/2020 1826   APPEARANCEUR CLEAR 07/13/2020 1826   LABSPEC 1.013 07/13/2020 1826   PHURINE 6.0 07/13/2020 1826   GLUCOSEU NEGATIVE 07/13/2020 1826   HGBUR NEGATIVE 07/13/2020 1826   BILIRUBINUR NEGATIVE 07/13/2020 1826   KETONESUR NEGATIVE 07/13/2020 1826   PROTEINUR 30 (A) 07/13/2020 1826   UROBILINOGEN 0.2 08/26/2010 1427   NITRITE NEGATIVE 07/13/2020 1826   LEUKOCYTESUR NEGATIVE 07/13/2020 1826   Sepsis Labs Invalid input(s): PROCALCITONIN,  WBC,  LACTICIDVEN Microbiology Recent Results (from the past 240 hour(s))  Resp Panel by RT-PCR (Flu A&B, Covid) Nasopharyngeal Swab     Status: None   Collection Time: 07/13/20  5:27 PM   Specimen: Nasopharyngeal Swab; Nasopharyngeal(NP) swabs in vial transport medium  Result Value Ref Range Status   SARS Coronavirus 2 by RT PCR NEGATIVE NEGATIVE Final    Comment: (NOTE) SARS-CoV-2 target nucleic acids are NOT DETECTED.  The SARS-CoV-2 RNA is  generally detectable in upper respiratory specimens during the acute phase of infection. The lowest concentration of SARS-CoV-2 viral copies this assay can detect is 138 copies/mL. A negative result does not preclude SARS-Cov-2 infection and should not be used as the sole basis for treatment or other patient management decisions. A negative result may occur with  improper specimen collection/handling, submission of specimen other than nasopharyngeal swab, presence of viral mutation(s) within the areas targeted by this assay, and inadequate number of viral copies(<138 copies/mL). A negative result must be combined with clinical observations, patient history, and epidemiological information. The expected result is Negative.  Fact Sheet for Patients:  EntrepreneurPulse.com.au  Fact Sheet for Healthcare Providers:  IncredibleEmployment.be  This test is no t yet approved or cleared by the [place] FDA and   has been authorized for detection and/or diagnosis of SARS-CoV-2 by FDA under an Emergency Use Authorization (EUA). This EUA will remain  in effect (meaning this test can be used) for the duration of the COVID-19 declaration under Section 564(b)(1) of the Act, 21 U.S.C.section 360bbb-3(b)(1), unless the authorization is terminated  or revoked sooner.       Influenza A by PCR NEGATIVE NEGATIVE Final   Influenza B by PCR NEGATIVE NEGATIVE Final    Comment: (NOTE) The Xpert Xpress SARS-CoV-2/FLU/RSV plus assay is intended as an aid in the diagnosis of influenza from Nasopharyngeal swab specimens and should not be used as a sole basis for treatment. Nasal washings and aspirates are unacceptable for Xpert Xpress SARS-CoV-2/FLU/RSV testing.  Fact Sheet for Patients: EntrepreneurPulse.com.au  Fact Sheet for Healthcare Providers: IncredibleEmployment.be  This test is not yet approved or cleared by the [place] FDA and has been authorized for detection and/or diagnosis of SARS-CoV-2 by FDA under an Emergency Use Authorization (EUA). This EUA will remain in effect (meaning this test can be used) for the duration of the COVID-19 declaration under Section 564(b)(1) of the Act, 21 U.S.C. section 360bbb-3(b)(1), unless the authorization is terminated or revoked.  Performed at [facility], [place] [address], [place], [place] [address]    Time spent: 30 min  SIGNED:   Marylu Lund, MD  [facility] 07/18/2020, 2:53 PM  If 7PM-7AM, please contact night-coverage

## 2020-07-18 NOTE — Progress Notes (Signed)
Progress Note  Patient Name: Kelly Guzman Date of Encounter: 07/18/2020  [facility] Cardiologist: Mertie Moores, MD   Subjective   Breathing much better, still with some lower extremity edema. HR and BP are better   I dont think the I/O are accurate.   Wt is down 6 kg from admission   On  spironolactone 25 mg a day  Cont entresto Increase coreg for her tachycardia    Inpatient Medications    Scheduled Meds: . sodium chloride   Intravenous Once  . carvedilol  12.5 mg Oral BID WC  . cetirizine  10 mg Oral Daily  . pantoprazole (PROTONIX) IV  40 mg Intravenous Q12H  . sacubitril-valsartan  1 tablet Oral BID  . spironolactone  25 mg Oral Daily   Continuous Infusions:  PRN Meds: acetaminophen **OR** acetaminophen, lip balm, menthol-cetylpyridinium, metoprolol tartrate, ondansetron **OR** ondansetron (ZOFRAN) IV, phenol, traMADol   Vital Signs    Vitals:   07/17/20 1153 07/17/20 1419 07/17/20 2059 07/18/20 0557  BP:  112/78 115/71 132/85  Pulse:  100 98 94  Resp:  16 17 17   Temp:  98.5 F (36.9 C) 98.3 F (36.8 C) 98 F (36.7 C)  TempSrc:  Oral Oral Oral  SpO2: 97% 99% 99% 100%  Weight:    57.4 kg  Height:        Intake/Output Summary (Last 24 hours) at 07/18/2020 0809 Last data filed at 07/18/2020 0626 Gross per 24 hour  Intake 604 ml  Output 100 ml  Net 504 ml   Last 3 Weights 07/18/2020 07/17/2020 07/16/2020  Weight (lbs) 126 lb 8.7 oz 132 lb 15 oz 135 lb 5.8 oz  Weight (kg) 57.4 kg 60.3 kg 61.4 kg      Telemetry    Sinus rhythm  Personally Reviewed  ECG    None today - Personally Reviewed  Physical Exam   Physical Exam: Blood pressure 132/85, pulse 94, temperature 98 F (36.7 C), temperature source Oral, resp. rate 17, height 5' (1.524 m), weight 57.4 kg, SpO2 100 %.  GEN:  Well nourished, well developed in no acute distress HEENT: Normal NECK: No JVD; No carotid bruits LYMPHATICS: No lymphadenopathy CARDIAC: RRR , soft systolic  murmur  RESPIRATORY:  Clear to auscultation without rales, wheezing or rhonchi  ABDOMEN: Soft, non-tender, non-distended MUSCULOSKELETAL:  No edema; No deformity  SKIN: Warm and dry NEUROLOGIC:  Alert and oriented x 3    Labs    High Sensitivity Troponin:  No results for input(s): TROPONINIHS in the last 720 hours.    Chemistry Recent Labs  Lab 07/16/20 0415 07/17/20 0538 07/18/20 0458  NA 142 138 136  K 4.2 3.3* 3.8  CL 111 104 105  CO2 24 25 23   GLUCOSE 98 101* 107*  BUN 10 11 15   CREATININE 0.58 0.52 0.57  CALCIUM 8.6* 8.8* 8.5*  PROT 6.5 6.9 6.6  ALBUMIN 3.1* 3.3* 3.1*  AST 22 30 31   ALT 21 27 30   ALKPHOS 147* 139* 127*  BILITOT 0.8 1.1 0.8  GFRNONAA >60 >60 >60  ANIONGAP 7 9 8      Hematology Recent Labs  Lab 07/16/20 0415 07/17/20 0538 07/18/20 0458  WBC 9.3 7.6 7.4  RBC 4.46 5.01 4.94  HGB 8.8* 9.8* 9.8*  HCT 31.2* 35.1* 35.6*  MCV 70.0* 70.1* 72.1*  MCH 19.7* 19.6* 19.8*  MCHC 28.2* 27.9* 27.5*  RDW Not Measured Not Measured Not Measured  PLT 395 407* 372    BNP Recent Labs  Lab 07/14/20 1211  BNP 1,133.9*    Magnesium  Date Value Ref Range Status  07/15/2020 1.7 1.7 - 2.4 mg/dL Final    Comment:    Performed at [facility], [place] [address] [place], [place] [address]  07/14/2020 1.7 1.7 - 2.4 mg/dL Final    Comment:    Performed at [facility], [place] [address], [place], [place] [address]   Lab Results  Component Value Date   TSH 2.782 07/13/2020    DDimer No results for input(s): DDIMER in the last 168 hours.   Radiology    VAS [place] LOWER EXTREMITY VENOUS (DVT)  Result Date: 07/17/2020  Lower Venous DVT Study Indications: RLE cramping.  Comparison Study: no previous exams Performing Technologist: Rogelia Rohrer  Examination Guidelines: A complete evaluation includes B-mode imaging, spectral Doppler, color Doppler, and power Doppler as needed of all accessible portions of each vessel. Bilateral  testing is considered an integral part of a complete examination. Limited examinations for reoccurring indications may be performed as noted. The reflux portion of the exam is performed with the patient in reverse Trendelenburg.  +-----+---------------+---------+-----------+----------+--------------+ RIGHTCompressibilityPhasicitySpontaneityPropertiesThrombus Aging +-----+---------------+---------+-----------+----------+--------------+ CFV  Full           Yes      Yes                                 +-----+---------------+---------+-----------+----------+--------------+   +---------+---------------+---------+-----------+----------+--------------+ LEFT     CompressibilityPhasicitySpontaneityPropertiesThrombus Aging +---------+---------------+---------+-----------+----------+--------------+ CFV      Full           Yes      Yes                                 +---------+---------------+---------+-----------+----------+--------------+ SFJ      Full                                                        +---------+---------------+---------+-----------+----------+--------------+ FV Prox  Full           Yes      Yes                                 +---------+---------------+---------+-----------+----------+--------------+ FV Mid   Full           Yes      Yes                                 +---------+---------------+---------+-----------+----------+--------------+ FV DistalFull           Yes      Yes                                 +---------+---------------+---------+-----------+----------+--------------+ PFV      Full                                                        +---------+---------------+---------+-----------+----------+--------------+  POP      Full           Yes      Yes                                 +---------+---------------+---------+-----------+----------+--------------+ PTV      Full                                                         +---------+---------------+---------+-----------+----------+--------------+ PERO     Full                                                        +---------+---------------+---------+-----------+----------+--------------+     Summary: RIGHT: - There is no evidence of deep vein thrombosis in the lower extremity. - There is no evidence of superficial venous thrombosis.  - No cystic structure found in the popliteal fossa.  LEFT: - No evidence of common femoral vein obstruction.  *See table(s) above for measurements and observations. Electronically signed by Deitra Mayo MD on 07/17/2020 at 6:39:16 PM.    Final     Cardiac Studies   ECHO: 07/14/2020 1. Left ventricular ejection fraction, by estimation, is 35 to 40%. The  left ventricle has moderately decreased function. The left ventricle  demonstrates global hypokinesis. Indeterminate diastolic filling due to  E-A fusion. There is the interventricular septum is flattened in diastole ('D' shaped left ventricle), consistent with right ventricular volume overload.  2. Right ventricular systolic function is moderately reduced. The right  ventricular size is mildly enlarged. There is moderately elevated  pulmonary artery systolic pressure w/ RVSP 51.7 mmHg.  3. Left atrial size was mildly dilated.  4. Right atrial size was mild to moderately dilated.  5. The pericardial effusion is circumferential. Large pleural effusion in  the left lateral region.  6. The mitral valve is normal in structure. Mild to moderate mitral valve  regurgitation.  7. Tricuspid valve regurgitation is moderate to severe.  8. The aortic valve is tricuspid. Aortic valve regurgitation is not  visualized.  9. The inferior vena cava is dilated in size with <50% respiratory  variability, suggesting right atrial pressure of 15 mmHg.   Patient Profile     45 y.o. female w/ hx HTN (intol mult meds), chronic IDA req transfusion 07/10/2020, pre-eclampsia,  who was admitted 04/04 with DOE, LE edema, EF 35-40% on echo.  Assessment & Plan    1. Acute systolic CHF w/ biventricular failure:  On spironolactone, entresto Will increase coreg ( HR is still elevated) She will follow up with me or APP in 4-6 weeks   2. Mild-mod MR & mod-severe TR  may improve as her CHF improves   3. Severe IDA felt 2nd menorrhagia  further management per GYN and primary team   4. HTN- well controlled at this time      For questions or updates, please contact [place] Please consult [email or web address] for contact info under     OK to be discharged from my standpoint  Ehrhardt will sign off.   Medication Recommendations:  Cont current meds.  Other recommendations (labs, testing, etc):   Follow up as an outpatient:  With Roe Wilner / APP in 4-6 weeks with BMP  +    Signed, Mertie Moores, MD  07/18/2020, 8:09 AM

## 2020-07-18 NOTE — Progress Notes (Signed)
Pt used BR a couple times overnight and forgot to urinate in the hat so we could record the amount.

## 2020-07-22 ENCOUNTER — Other Ambulatory Visit: Payer: Self-pay

## 2020-07-22 ENCOUNTER — Encounter (HOSPITAL_COMMUNITY): Payer: Self-pay | Admitting: Internal Medicine

## 2020-07-22 ENCOUNTER — Inpatient Hospital Stay (HOSPITAL_COMMUNITY)
Admission: EM | Admit: 2020-07-22 | Discharge: 2020-07-25 | DRG: 760 | Disposition: A | Discharge status: Home / Self Care | Payer: Medicaid Other | Attending: Internal Medicine | Admitting: Internal Medicine

## 2020-07-22 ENCOUNTER — Observation Stay (HOSPITAL_COMMUNITY): Payer: Medicaid Other

## 2020-07-22 DIAGNOSIS — D259 Leiomyoma of uterus, unspecified: Secondary | ICD-10-CM | POA: Diagnosis present

## 2020-07-22 DIAGNOSIS — D62 Acute posthemorrhagic anemia: Secondary | ICD-10-CM | POA: Diagnosis present

## 2020-07-22 DIAGNOSIS — Z79899 Other long term (current) drug therapy: Secondary | ICD-10-CM

## 2020-07-22 DIAGNOSIS — I10 Essential (primary) hypertension: Secondary | ICD-10-CM | POA: Diagnosis not present

## 2020-07-22 DIAGNOSIS — D509 Iron deficiency anemia, unspecified: Secondary | ICD-10-CM | POA: Diagnosis present

## 2020-07-22 DIAGNOSIS — Z20822 Contact with and (suspected) exposure to covid-19: Secondary | ICD-10-CM | POA: Diagnosis present

## 2020-07-22 DIAGNOSIS — D500 Iron deficiency anemia secondary to blood loss (chronic): Secondary | ICD-10-CM | POA: Diagnosis present

## 2020-07-22 DIAGNOSIS — N920 Excessive and frequent menstruation with regular cycle: Principal | ICD-10-CM | POA: Diagnosis present

## 2020-07-22 DIAGNOSIS — I110 Hypertensive heart disease with heart failure: Secondary | ICD-10-CM | POA: Diagnosis present

## 2020-07-22 DIAGNOSIS — I5022 Chronic systolic (congestive) heart failure: Secondary | ICD-10-CM | POA: Diagnosis present

## 2020-07-22 DIAGNOSIS — Z28310 Unvaccinated for covid-19: Secondary | ICD-10-CM

## 2020-07-22 DIAGNOSIS — Z880 Allergy status to penicillin: Secondary | ICD-10-CM

## 2020-07-22 DIAGNOSIS — R946 Abnormal results of thyroid function studies: Secondary | ICD-10-CM | POA: Diagnosis present

## 2020-07-22 DIAGNOSIS — D649 Anemia, unspecified: Secondary | ICD-10-CM | POA: Diagnosis not present

## 2020-07-22 DIAGNOSIS — Z8249 Family history of ischemic heart disease and other diseases of the circulatory system: Secondary | ICD-10-CM

## 2020-07-22 DIAGNOSIS — N921 Excessive and frequent menstruation with irregular cycle: Secondary | ICD-10-CM

## 2020-07-22 DIAGNOSIS — I3139 Other pericardial effusion (noninflammatory): Secondary | ICD-10-CM | POA: Diagnosis present

## 2020-07-22 DIAGNOSIS — I313 Pericardial effusion (noninflammatory): Secondary | ICD-10-CM | POA: Diagnosis present

## 2020-07-22 LAB — CBC
HCT: 23.8 % — ABNORMAL LOW (ref 36.0–46.0)
Hemoglobin: 6.6 g/dL — CL (ref 12.0–15.0)
MCH: 19.8 pg — ABNORMAL LOW (ref 26.0–34.0)
MCHC: 27.7 g/dL — ABNORMAL LOW (ref 30.0–36.0)
MCV: 71.3 fL — ABNORMAL LOW (ref 80.0–100.0)
Platelets: 391 10*3/uL (ref 150–400)
RBC: 3.34 MIL/uL — ABNORMAL LOW (ref 3.87–5.11)
WBC: 7.9 10*3/uL (ref 4.0–10.5)
nRBC: 0 % (ref 0.0–0.2)

## 2020-07-22 LAB — CBC WITH DIFFERENTIAL/PLATELET
Abs Immature Granulocytes: 0.02 10*3/uL (ref 0.00–0.07)
Basophils Absolute: 0.1 10*3/uL (ref 0.0–0.1)
Basophils Relative: 1 %
Eosinophils Absolute: 0.1 10*3/uL (ref 0.0–0.5)
Eosinophils Relative: 1 %
HCT: 25.7 % — ABNORMAL LOW (ref 36.0–46.0)
Hemoglobin: 7.3 g/dL — ABNORMAL LOW (ref 12.0–15.0)
Immature Granulocytes: 0 %
Lymphocytes Relative: 21 %
Lymphs Abs: 1.7 10*3/uL (ref 0.7–4.0)
MCH: 20 pg — ABNORMAL LOW (ref 26.0–34.0)
MCHC: 28.4 g/dL — ABNORMAL LOW (ref 30.0–36.0)
MCV: 70.4 fL — ABNORMAL LOW (ref 80.0–100.0)
Monocytes Absolute: 0.6 10*3/uL (ref 0.1–1.0)
Monocytes Relative: 7 %
Neutro Abs: 5.7 10*3/uL (ref 1.7–7.7)
Neutrophils Relative %: 70 %
Platelets: 385 10*3/uL (ref 150–400)
RBC: 3.65 MIL/uL — ABNORMAL LOW (ref 3.87–5.11)
WBC: 8.2 10*3/uL (ref 4.0–10.5)
nRBC: 0 % (ref 0.0–0.2)

## 2020-07-22 LAB — RESP PANEL BY RT-PCR (FLU A&B, COVID) ARPGX2
Influenza A by PCR: NEGATIVE
Influenza B by PCR: NEGATIVE
SARS Coronavirus 2 by RT PCR: NEGATIVE

## 2020-07-22 LAB — BASIC METABOLIC PANEL
Anion gap: 9 (ref 5–15)
BUN: 10 mg/dL (ref 6–20)
CO2: 22 mmol/L (ref 22–32)
Calcium: 9.5 mg/dL (ref 8.9–10.3)
Chloride: 108 mmol/L (ref 98–111)
Creatinine, Ser: 0.84 mg/dL (ref 0.44–1.00)
GFR, Estimated: >60 mL/min (ref >60)
Glucose, Bld: 98 mg/dL (ref 70–99)
Potassium: 4.3 mmol/L (ref 3.5–5.1)
Sodium: 139 mmol/L (ref 135–145)

## 2020-07-22 MED ORDER — SODIUM CHLORIDE 0.9 % IV SOLN: 250.0000 mL | INTRAVENOUS | Status: DC | PRN

## 2020-07-22 MED ORDER — ACETAMINOPHEN 325 MG PO TABS: 650.0000 mg | ORAL_TABLET | Freq: Four times a day (QID) | ORAL | Status: DC | PRN

## 2020-07-22 MED ORDER — MEGESTROL ACETATE 20 MG PO TABS
20.0000 mg | ORAL_TABLET | Freq: Two times a day (BID) | ORAL | Status: DC
  Administered 2020-07-23 – 2020-07-25 (×5): 20 mg via ORAL
  Filled 2020-07-22 (×6): qty 1

## 2020-07-22 MED ORDER — HYDROCODONE-ACETAMINOPHEN 5-325 MG PO TABS
1.0000 | ORAL_TABLET | ORAL | Status: DC | PRN
  Administered 2020-07-23: 2 via ORAL
  Administered 2020-07-24: 1 via ORAL
  Filled 2020-07-22: qty 1
  Filled 2020-07-22: qty 2

## 2020-07-22 MED ORDER — ACETAMINOPHEN 650 MG RE SUPP: 650.0000 mg | Freq: Four times a day (QID) | RECTAL | Status: DC | PRN

## 2020-07-22 MED ORDER — SODIUM CHLORIDE 0.9% FLUSH: 3.0000 mL | INTRAVENOUS | Status: DC | PRN

## 2020-07-22 MED ORDER — SPIRONOLACTONE 25 MG PO TABS
25.0000 mg | ORAL_TABLET | Freq: Every day | ORAL | Status: DC
  Administered 2020-07-23 – 2020-07-25 (×3): 25 mg via ORAL
  Filled 2020-07-22 (×3): qty 1

## 2020-07-22 MED ORDER — CARVEDILOL 25 MG PO TABS
25.0000 mg | ORAL_TABLET | Freq: Two times a day (BID) | ORAL | Status: DC
  Administered 2020-07-23 – 2020-07-25 (×5): 25 mg via ORAL
  Filled 2020-07-22 (×5): qty 1

## 2020-07-22 MED ORDER — SODIUM CHLORIDE 0.9% FLUSH
3.0000 mL | Freq: Two times a day (BID) | INTRAVENOUS | Status: DC
  Administered 2020-07-22 – 2020-07-25 (×3): 3 mL via INTRAVENOUS

## 2020-07-22 NOTE — ED Triage Notes (Signed)
Patient states she was concerned with the amount of blood she has lost from her period, started this morning, has a hx of fibroids. Patient endorses changing her pads every few minutes with heavy clots this morning. Denies dizziness, weakness or issues with changing position.

## 2020-07-22 NOTE — Plan of Care (Signed)

## 2020-07-22 NOTE — ED Notes (Signed)
No distress noted when checked frequently.

## 2020-07-22 NOTE — ED Notes (Signed)
Pt transported to Xray. 

## 2020-07-22 NOTE — H&P (Addendum)
Kelly Guzman LGX:[id number] DOB: Dec 29, 1975 DOA: 07/22/2020    PCP: Nolene Ebbs, MD   Outpatient Specialists:   CARDS: Dr. Acie Fredrickson    Oncology Dr. Lorenso Courier   Patient arrived to ER on 07/22/20 at 1712 Referred by Attending Valarie Merino, MD   Patient coming from: home Lives  alone    Chief Complaint: Heavy vaginal bleeding  HPI: Kelly Guzman is a 44 y.o. female with medical history significant of systolic CHF EF 81-44%, chronic Iron def Anemia,     Presented with   heavy menstrual.  This started this morning she has history of heavy.  Given anemia requiring blood transfusion noted some heavy clots otherwise no dizziness no weakness bleeding has improved since then.  Pt with known hx of Iron def anemia requiring frequent transfusions/transfusions was 2 weeks ago on 10 July 2020 felt to be secondary to mental menorrhagia Recently admitted 2 weeks ago with a hemoglobin of 3.5 She required 3 units of packed red blood cells and hemoglobin trended up to 9.8 she was supposed to closely follow-up with OB/GYN CT scan showed uterine fibroids. While hospitalized she was newly diagnosed with acute systolic CHF cardiology started her on Entresto but insurance did not cover she was switched to Cozaar and plan was for her to follow-up with cardiology as an outpatient Also spironolactone Coreg was added She was unable to get an appoint with GYN   Has NOt been vaccinated against COVID     Initial COVID TEST  NEGATIVE   Lab Results  Component Value Date   [place] 07/22/2020   [place] NEGATIVE 07/13/2020     Regarding pertinent Chronic problems:      HTN on spironolactone Coreg   chronic CHF   Systolic  - last echo 11/09/8561 ejection fraction, by estimation, is 35 to 40% consistent with right ventricular volume overload. There is moderately elevated  pulmonary artery systolic pressure w/ RVSP 51.7 mmHg. The pericardial effusion is circumferential. Large  pleural effusion in  the left lateral region.      Chronic anemia - baseline hg Hemoglobin & Hematocrit  Recent Labs    07/17/20 0538 07/18/20 0458 07/22/20 1730  HGB 9.8* 9.8* 7.3*    While in ER: Noted to have hemoglobin down to 7.3 Normotensive    ED Triage Vitals  Enc Vitals Group     BP 07/22/20 1722 120/68     Pulse Rate 07/22/20 1722 95     Resp 07/22/20 1722 18     Temp 07/22/20 1726 99.1 F (37.3 C)     Temp Source 07/22/20 1726 Oral     SpO2 07/22/20 1722 99 %     Weight --      Height --      Head Circumference --      Peak Flow --      Pain Score 07/22/20 1722 0     Pain Loc --      Pain Edu? --      Excl. in [place]? --   TMAX(24)@     _________________________________________ Significant initial  Findings: Abnormal Labs Reviewed  CBC WITH DIFFERENTIAL/PLATELET - Abnormal; Notable for the following components:      Result Value   RBC 3.65 (*)    Hemoglobin 7.3 (*)    HCT 25.7 (*)    MCV 70.4 (*)    MCH 20.0 (*)    MCHC 28.4 (*)    All other components within normal limits   ____________________________________________  Ordered   ECG: Ordered Personally reviewed by me showing: HR : 98 Rhythm:  NSR,    no evidence of ischemic changes QTC 416 ___________  The recent clinical data is shown below. Vitals:   07/22/20 1722 07/22/20 1726 07/22/20 1835  BP: 120/68  131/80  Pulse: 95  92  Resp: 18  20  Temp:  99.1 F (37.3 C) 98.8 F (37.1 C)  TempSrc:  Oral Oral  SpO2: 99%  100%     WBC     Component Value Date/Time   WBC 8.2 07/22/2020 1730   LYMPHSABS 1.7 07/22/2020 1730   MONOABS 0.6 07/22/2020 1730   EOSABS 0.1 07/22/2020 1730   BASOSABS 0.1 07/22/2020 1730      UA not ordered     Results for orders placed or performed during the hospital encounter of 07/22/20  Resp Panel by RT-PCR (Flu A&B, Covid) Nasopharyngeal Swab     Status: None   Collection Time: 07/22/20  7:43 PM   Specimen: Nasopharyngeal Swab; Nasopharyngeal(NP) swabs  in vial transport medium  Result Value Ref Range Status   SARS Coronavirus 2 by RT PCR NEGATIVE NEGATIVE Final         Influenza A by PCR NEGATIVE NEGATIVE Final   Influenza B by PCR NEGATIVE NEGATIVE Final           _______________________________________________________ ER Provider Called:    Ob/GYN  Dr Rip Harbour They Recommend admit to medicineMEGACE 20mg  PO BID. This should be continued until outpatient GYN FU occurs. Dr. Rip Harbour will help facilitate close FU with his clinic in the outpatient setting.    _______________________________________________ Hospitalist was called for admission for blood loss anemia  The following Work up has been ordered so far:  Orders Placed This Encounter  Procedures  . CBC with Differential  . Basic metabolic panel  . Consult to hospitalist  Anemia  . Type and screen [place]ank  . Saline lock IV    Following Medications were ordered in ER: Medications - No data to display      Consult Orders  (From admission, onward)         Start     Ordered   07/22/20 1913  Consult to hospitalist  Anemia  Once       Comments: Anemia  Provider:  (Not yet assigned)  Question Answer Comment  Place call to: [facility]   Reason for Consult Admit      07/22/20 1914            OTHER Significant initial  Findings:  labs showing:    Recent Labs  Lab 07/16/20 0415 07/17/20 0538 07/18/20 0458 07/22/20 1730  NA 142 138 136 139  K 4.2 3.3* 3.8 4.3  CO2 24 25 23 22   GLUCOSE 98 101* 107* 98  BUN 10 11 15 10   CREATININE 0.58 0.52 0.57 0.84  CALCIUM 8.6* 8.8* 8.5* 9.5    Cr Up from baseline see below Lab Results  Component Value Date   CREATININE 0.84 07/22/2020   CREATININE 0.57 07/18/2020   CREATININE 0.52 07/17/2020    Recent Labs  Lab 07/16/20 0415 07/17/20 0538 07/18/20 0458  AST 22 30 31   ALT 21 27 30   ALKPHOS 147* 139* 127*  BILITOT 0.8 1.1 0.8  PROT 6.5 6.9 6.6  ALBUMIN 3.1* 3.3* 3.1*   Lab  Results  Component Value Date   CALCIUM 9.5 07/22/2020        Plt: Lab Results  Component  Value Date   PLT 385 07/22/2020      Recent Labs  Lab 07/16/20 0415 07/17/20 0538 07/18/20 0458 07/22/20 1730  WBC 9.3 7.6 7.4 8.2  NEUTROABS  --   --   --  5.7  HGB 8.8* 9.8* 9.8* 7.3*  HCT 31.2* 35.1* 35.6* 25.7*  MCV 70.0* 70.1* 72.1* 70.4*  PLT 395 407* 372 385    HG/HCT     Component Value Date/Time   HGB 7.3 (L) 07/22/2020 1730   HGB 3.5 (LL) 07/13/2020 1452   HCT 25.7 (L) 07/22/2020 1730   MCV 70.4 (L) 07/22/2020 1730      BNP (last 3 results) Recent Labs    07/14/20 1211  BNP 1,133.9*       Radiological Exams on Admission: DG Chest 2 View  Result Date: 07/22/2020 CLINICAL DATA:  Dyspnea. EXAM: CHEST - 2 VIEW COMPARISON:  07/14/2020 FINDINGS: The heart is enlarged, similar to prior exam allowing for differences in technique. Small pleural effusions have improved in the interim. Improved bibasilar atelectasis. No pulmonary edema. No acute airspace disease. No acute osseous abnormalities are seen. IMPRESSION: Stable cardiomegaly. Improved small pleural effusions and bibasilar atelectasis since prior. Electronically Signed   By: Keith Rake M.D.   On: 07/22/2020 20:11   _______________________________________________________________________________________________________ Latest  Blood pressure 131/80, pulse 92, temperature 98.8 F (37.1 C), temperature source Oral, resp. rate 20, last menstrual period 07/22/2020, SpO2 100 %.   Review of Systems:    Pertinent positives include: vaginal bleeding  Constitutional:  No weight loss, night sweats, Fevers, chills, fatigue, weight loss  HEENT:  No headaches, Difficulty swallowing,Tooth/dental problems,Sore throat,  No sneezing, itching, ear ache, nasal congestion, post nasal drip,  Cardio-vascular:  No chest pain, Orthopnea, PND, anasarca, dizziness, palpitations.no Bilateral lower extremity swelling  GI:  No  heartburn, indigestion, abdominal pain, nausea, vomiting, diarrhea, change in bowel habits, loss of appetite, melena, blood in stool, hematemesis Resp:  no shortness of breath at rest. No dyspnea on exertion, No excess mucus, no productive cough, No non-productive cough, No coughing up of blood.No change in color of mucus.No wheezing. Skin:  no rash or lesions. No jaundice GU:  no dysuria, change in color of urine, no urgency or frequency. No straining to urinate.  No flank pain.  Musculoskeletal:  No joint pain or no joint swelling. No decreased range of motion. No back pain.  Psych:  No change in mood or affect. No depression or anxiety. No memory loss.  Neuro: no localizing neurological complaints, no tingling, no weakness, no double vision, no gait abnormality, no slurred speech, no confusion  All systems reviewed and apart from [place] all are negative _______________________________________________________________________________________________ Past Medical History:   Past Medical History:  Diagnosis Date  . Anemia       Past Surgical History:  Procedure Laterality Date  . CESAREAN SECTION     gave birth to her daughter around 2002    Social History:  Ambulatory  independently       reports that she has never smoked. She has never used smokeless tobacco. She reports that she does not drink alcohol and does not use drugs.   Family History:   Family History  Problem Relation Age of Onset  . Diabetes Mother   . Hypertension Mother   . Diabetes Father   . Heart failure Father   . Heart failure Paternal Grandfather    ______________________________________________________________________________________________ Allergies: Allergies  Allergen Reactions  . Penicillins Rash     Prior  to Admission medications   Medication Sig Start Date End Date Taking? Authorizing Provider  carvedilol (COREG) 25 MG tablet Take 1 tablet (25 mg total) by mouth 2 (two) times daily  with a meal. 07/18/20 08/17/20  Donne Hazel, MD  cetirizine (ZYRTEC) 10 MG tablet Take 10 mg by mouth daily. 07/10/20   [provider]  Iron-FA-B Cmp-C-Biot-Probiotic (FUSION PLUS PO) Take 1 capsule by mouth daily.    [provider]  losartan (COZAAR) 50 MG tablet Take 1 tablet (50 mg total) by mouth daily. 07/18/20 08/17/20  Donne Hazel, MD  spironolactone (ALDACTONE) 25 MG tablet Take 1 tablet (25 mg total) by mouth daily. 07/19/20 08/18/20  Donne Hazel, MD    ___________________________________________________________________________________________________ Physical Exam: Vitals with BMI 07/22/2020 07/22/2020 07/18/2020  Height - - -  Weight - - 126 lbs 9 oz  BMI - - 83.38  Systolic 250 539 767  Diastolic 80 68 85  Pulse 92 95 94     1. General:  in No  Acute distress   Chronically ill  -appearing 2. Psychological: Alert and   Oriented 3. Head/ENT:   Moist   Mucous Membranes                          Head Non traumatic, neck supple                            Poor Dentition 4. SKIN:   decreased Skin turgor,  Skin clean Dry and intact no rash 5. Heart: Regular rate and rhythm no  Murmur, no Rub or gallop 6. Lungs: Clear to auscultation bilaterally, no wheezes or crackles   7. Abdomen: Soft,  Non distended fibroid uterus present 8. Lower extremities: no clubbing, cyanosis, no edema 9. Neurologically Grossly intact, moving all 4 extremities equally  10. MSK: Normal range of motion    Chart has been reviewed  ______________________________________________________________________________________________  Assessment/Plan  45 y.o. female with medical history significant of systolic CHF EF 34-19%, chronic Iron def Anemia,   Admitted for blood loss anemia  Present on Admission: . Blood loss anemia -repeat hemoglobin down to 6.2 will transfuse 1 unit  . Symptomatic anemia -transfuse 1 unit and continue to follow CBC  . Menorrhagia - MEGACE 20mg  PO BID. This should  be continued until outpatient GYN FU occurs. Dr. Rip Harbour will help facilitate close FU with his clinic in the outpatient setting.   . Chronic systolic CHF (congestive heart failure) ([place]) appears euvolemic at this time continue home medications  . Essential hypertension -stable continue home medications  . Pericardial effusion -stable cardiac silhouette hemodynamically stable Need to follow-up as outpatient cardiology  Elevated TSH will check T3 and T4  Other plan as per orders.  DVT prophylaxis:  SCD     Code Status:    Code Status: Prior FULL CODE as per patient   I had personally discussed CODE STATUS with patient     Family Communication:   Family not at  Bedside    Disposition Plan:         To home once workup is complete and patient is stable   Following barriers for discharge:  Anemia stable                                Consults called: aware  OBGYN will facilitate out pt follow up  Admission status:  ED Disposition    ED Disposition Condition [place]: [place] [[id number]]  Level of Care: Telemetry [5]  Admit to tele based on following criteria: Other see comments  Comments: symptomatic anemia  Covid Evaluation: Asymptomatic Screening Protocol (No Symptoms)  Diagnosis: Blood loss anemia [676195]  Admitting Physician: Toy Baker [[id number]]  Attending Physician: Toy Baker [[id number]]        Obs     Level of care    tele  For 12H   Lab Results  Component Value Date   [place] NEGATIVE 07/22/2020     Precautions: admitted as Covid Negative     PPE: Used by the provider:   N95  eye Goggles,  Gloves      Wilberto Console 07/22/2020, 2:53 AM    [facility]     after 2 AM please page floor coverage PA If 7AM-7PM, please contact the day team taking care of the patient using [email or web address]   Patient was evaluated in the context of the  global COVID-19 pandemic, which necessitated consideration that the patient might be at risk for infection with the SARS-CoV-2 virus that causes COVID-19. Institutional protocols and algorithms that pertain to the evaluation of patients at risk for COVID-19 are in a state of rapid change based on information released by regulatory bodies including the CDC and federal and state organizations. These policies and algorithms were followed during the patient's care.

## 2020-07-22 NOTE — Progress Notes (Signed)
(  Sent page to B. Lennox Grumbles.) @ [phone]  [id number] Prom, [place] critical lab for hgb 6.6, Thanks Abby, Therapist, sports

## 2020-07-22 NOTE — ED Notes (Signed)
Pt refused her megastrol; I explained to the pt that we wanted to help stop her bleeding, pt stated that she does not like medication and that she would rather not take the medicine at this time.

## 2020-07-22 NOTE — ED Provider Notes (Signed)
[facility] DEPT Provider Note   CSN: [id number] Arrival date & time: 07/22/20  1712     History No chief complaint on file.   Kelly Guzman is a 45 y.o. female.  45 year old female with prior medical history as detailed below presents for evaluation of possible recurrent anemia.  Patient with recent history of anemia thought to be secondary to significant GYN bleeding related to uterine fibroids.  Patient reports recent admission for same.  At time of recent admission, she had a presenting hemoglobin of 3.5.  She was transfused to a hemoglobin of 9.  She was discharged 4 days prior.  She reports increased vaginal bleeding over the last 24 to 36 hours.  She denies lightheadedness or dizziness.  She is concerned that her hgb may have dropped in the time since most recent admission.  The history is provided by the patient and medical records.  Illness Location:  Chronic anemia, uterine fibroids, vaginal bleeding  Severity:  Mild Onset quality:  Gradual Timing:  Sporadic Progression:  Waxing and waning Chronicity:  Recurrent      Past Medical History:  Diagnosis Date  . Anemia     Patient Active Problem List   Diagnosis Date Noted  . Acute on chronic combined systolic and diastolic CHF (congestive heart failure) ([place])   . Symptomatic anemia 07/13/2020  . Pedal edema 07/13/2020  . Tachycardia 07/13/2020  . Iron deficiency 07/13/2020  . Menorrhagia 07/13/2020    Past Surgical History:  Procedure Laterality Date  . CESAREAN SECTION     gave birth to her daughter around 2002     OB History   No obstetric history on file.     Family History  Problem Relation Age of Onset  . Diabetes Mother   . Hypertension Mother   . Diabetes Father   . Heart failure Father   . Heart failure Paternal Grandfather     Social History   Tobacco Use  . Smoking status: Never Smoker  . Smokeless tobacco: Never Used  Vaping Use  . Vaping Use:  Never used  Substance Use Topics  . Alcohol use: Never  . Drug use: Never    Home Medications Prior to Admission medications   Medication Sig Start Date End Date Taking? Authorizing Provider  carvedilol (COREG) 25 MG tablet Take 1 tablet (25 mg total) by mouth 2 (two) times daily with a meal. 07/18/20 08/17/20  Donne Hazel, MD  cetirizine (ZYRTEC) 10 MG tablet Take 10 mg by mouth daily. 07/10/20   [provider]  Iron-FA-B Cmp-C-Biot-Probiotic (FUSION PLUS PO) Take 1 capsule by mouth daily.    [provider]  losartan (COZAAR) 50 MG tablet Take 1 tablet (50 mg total) by mouth daily. 07/18/20 08/17/20  Donne Hazel, MD  spironolactone (ALDACTONE) 25 MG tablet Take 1 tablet (25 mg total) by mouth daily. 07/19/20 08/18/20  Donne Hazel, MD    Allergies    Penicillins  Review of Systems   Review of Systems  All other systems reviewed and are negative.   Physical Exam Updated Vital Signs BP 120/68 (BP Location: Right Arm)   Pulse 95   Temp 99.1 F (37.3 C) (Oral)   Resp 18   LMP 07/22/2020   SpO2 99%   Physical Exam Vitals and nursing note reviewed.  Constitutional:      General: She is not in acute distress.    Appearance: Normal appearance. She is well-developed.  HENT:  Head: Normocephalic and atraumatic.  Eyes:     Conjunctiva/sclera: Conjunctivae normal.     Pupils: Pupils are equal, round, and reactive to light.  Cardiovascular:     Rate and Rhythm: Normal rate and regular rhythm.     Heart sounds: Normal heart sounds.  Pulmonary:     Effort: Pulmonary effort is normal. No respiratory distress.     Breath sounds: Normal breath sounds.  Abdominal:     General: There is no distension.     Palpations: Abdomen is soft.     Tenderness: There is no abdominal tenderness.  Musculoskeletal:        General: No deformity. Normal range of motion.     Cervical back: Normal range of motion and neck supple.  Skin:    General: Skin is warm and dry.   Neurological:     Mental Status: She is alert and oriented to person, place, and time.     ED Results / Procedures / Treatments   Labs (all labs ordered are listed, but only abnormal results are displayed) Labs Reviewed  CBC WITH DIFFERENTIAL/PLATELET - Abnormal; Notable for the following components:      Result Value   RBC 3.65 (*)    Hemoglobin 7.3 (*)    HCT 25.7 (*)    MCV 70.4 (*)    MCH 20.0 (*)    MCHC 28.4 (*)    All other components within normal limits  RESP PANEL BY RT-PCR (FLU A&B, COVID) [id number]  BASIC METABOLIC PANEL  TYPE AND SCREEN    EKG None  Radiology No results found.  Procedures Procedures   Medications Ordered in ED Medications - No data to display  ED Course  I have reviewed the triage vital signs and the nursing notes.  Pertinent labs & imaging results that were available during my care of the patient were reviewed by me and considered in my medical decision making (see chart for details).    MDM Rules/Calculators/A&P                          MDM  MSE complete  Kelly Guzman was evaluated in Emergency Department on 07/22/2020 for the symptoms described in the history of present illness. She was evaluated in the context of the global COVID-19 pandemic, which necessitated consideration that the patient might be at risk for infection with the SARS-CoV-2 virus that causes COVID-19. Institutional protocols and algorithms that pertain to the evaluation of patients at risk for COVID-19 are in a state of rapid change based on information released by regulatory bodies including the CDC and federal and state organizations. These policies and algorithms were followed during the patient's care in the ED.   Patient is presenting with complaint of recurrent vaginal bleeding with associated lightheadedness and dizziness.  Patient with recent admission for same with symptomatic anemia secondary to her GYN bleeding.  Patient is with decreased  hemoglobin today.  Hemoglobin today 7.3 down to 9.8 at discharge.  Patient has not yet established GYN follow-up. She reports that her vaginal bleeding stopped just prior to arrival.   Patient likely would benefit from admission and likely transfusion.  Case discussed with covering GYN Dr. Arlina Robes. He recommends starting MEGACE 20mg  PO BID. This should be continued until outpatient GYN FU occurs. Dr. Rip Harbour will help facilitate close FU with his clinic in the outpatient setting.    Final Clinical Impression(s) / ED Diagnoses Final diagnoses:  Anemia, unspecified  type    Rx / DC Orders ED Discharge Orders    None       Valarie Merino, MD 07/22/20 1950

## 2020-07-23 DIAGNOSIS — I5022 Chronic systolic (congestive) heart failure: Secondary | ICD-10-CM | POA: Diagnosis not present

## 2020-07-23 DIAGNOSIS — I10 Essential (primary) hypertension: Secondary | ICD-10-CM | POA: Diagnosis not present

## 2020-07-23 DIAGNOSIS — D649 Anemia, unspecified: Secondary | ICD-10-CM | POA: Diagnosis not present

## 2020-07-23 DIAGNOSIS — D500 Iron deficiency anemia secondary to blood loss (chronic): Secondary | ICD-10-CM | POA: Diagnosis not present

## 2020-07-23 LAB — T4, FREE: Free T4: 1.01 ng/dL (ref 0.61–1.12)

## 2020-07-23 LAB — CBC
HCT: 24.4 % — ABNORMAL LOW (ref 36.0–46.0)
HCT: 25.4 % — ABNORMAL LOW (ref 36.0–46.0)
Hemoglobin: 7.3 g/dL — ABNORMAL LOW (ref 12.0–15.0)
Hemoglobin: 7.6 g/dL — ABNORMAL LOW (ref 12.0–15.0)
MCH: 22.5 pg — ABNORMAL LOW (ref 26.0–34.0)
MCH: 22.2 pg — ABNORMAL LOW (ref 26.0–34.0)
MCHC: 29.9 g/dL — ABNORMAL LOW (ref 30.0–36.0)
MCHC: 29.9 g/dL — ABNORMAL LOW (ref 30.0–36.0)
MCV: 75.3 fL — ABNORMAL LOW (ref 80.0–100.0)
MCV: 74.1 fL — ABNORMAL LOW (ref 80.0–100.0)
Platelets: 367 10*3/uL (ref 150–400)
Platelets: UNDETERMINED 10*3/uL (ref 150–400)
RBC: 3.24 MIL/uL — ABNORMAL LOW (ref 3.87–5.11)
RBC: 3.43 MIL/uL — ABNORMAL LOW (ref 3.87–5.11)
WBC: 5.8 10*3/uL (ref 4.0–10.5)
WBC: 5.9 10*3/uL (ref 4.0–10.5)
nRBC: 0 % (ref 0.0–0.2)
nRBC: 0.3 % — ABNORMAL HIGH (ref 0.0–0.2)

## 2020-07-23 LAB — COMPREHENSIVE METABOLIC PANEL
ALT: 40 U/L (ref 0–44)
AST: 33 U/L (ref 15–41)
Albumin: 3.6 g/dL (ref 3.5–5.0)
Alkaline Phosphatase: 88 U/L (ref 38–126)
Anion gap: 10 (ref 5–15)
BUN: 12 mg/dL (ref 6–20)
CO2: 20 mmol/L — ABNORMAL LOW (ref 22–32)
Calcium: 8.8 mg/dL — ABNORMAL LOW (ref 8.9–10.3)
Chloride: 109 mmol/L (ref 98–111)
Creatinine, Ser: 0.76 mg/dL (ref 0.44–1.00)
GFR, Estimated: >60 mL/min (ref >60)
Glucose, Bld: 91 mg/dL (ref 70–99)
Potassium: 3.7 mmol/L (ref 3.5–5.1)
Sodium: 139 mmol/L (ref 135–145)
Total Bilirubin: 0.5 mg/dL (ref 0.3–1.2)
Total Protein: 6.8 g/dL (ref 6.5–8.1)

## 2020-07-23 LAB — CBC WITH DIFFERENTIAL/PLATELET
Abs Immature Granulocytes: 0.02 10*3/uL (ref 0.00–0.07)
Basophils Absolute: 0.1 10*3/uL (ref 0.0–0.1)
Basophils Relative: 1 %
Eosinophils Absolute: 0.3 10*3/uL (ref 0.0–0.5)
Eosinophils Relative: 4 %
HCT: 22.4 % — ABNORMAL LOW (ref 36.0–46.0)
Hemoglobin: 6.2 g/dL — CL (ref 12.0–15.0)
Immature Granulocytes: 0 %
Lymphocytes Relative: 27 %
Lymphs Abs: 2 10*3/uL (ref 0.7–4.0)
MCH: 20.1 pg — ABNORMAL LOW (ref 26.0–34.0)
MCHC: 27.7 g/dL — ABNORMAL LOW (ref 30.0–36.0)
MCV: 72.7 fL — ABNORMAL LOW (ref 80.0–100.0)
Monocytes Absolute: 0.5 10*3/uL (ref 0.1–1.0)
Monocytes Relative: 7 %
Neutro Abs: 4.4 10*3/uL (ref 1.7–7.7)
Neutrophils Relative %: 61 %
Platelets: 381 10*3/uL (ref 150–400)
RBC: 3.08 MIL/uL — ABNORMAL LOW (ref 3.87–5.11)
WBC: 7.2 10*3/uL (ref 4.0–10.5)
nRBC: 0 % (ref 0.0–0.2)

## 2020-07-23 LAB — MAGNESIUM: Magnesium: 1.8 mg/dL (ref 1.7–2.4)

## 2020-07-23 LAB — PHOSPHORUS: Phosphorus: 5.5 mg/dL — ABNORMAL HIGH (ref 2.5–4.6)

## 2020-07-23 LAB — PREPARE RBC (CROSSMATCH)

## 2020-07-23 LAB — TSH: TSH: 6.509 u[IU]/mL — ABNORMAL HIGH (ref 0.350–4.500)

## 2020-07-23 MED ORDER — MEGESTROL ACETATE 20 MG PO TABS: 20.0000 mg | ORAL_TABLET | Freq: Two times a day (BID) | ORAL | 0 refills | Status: DC

## 2020-07-23 MED ORDER — SODIUM CHLORIDE 0.9% IV SOLUTION: Freq: Once | INTRAVENOUS | Status: AC

## 2020-07-23 NOTE — Discharge Summary (Signed)
Physician Discharge Summary  Kelly Guzman TKW:[id number] DOB: 06-04-1975 DOA: 07/22/2020  PCP: Nolene Ebbs, MD  Admit date: 07/22/2020 Discharge date: 07/23/2020  Admitted From: Home  Discharge disposition: Home  Recommendations for Outpatient Follow-Up:   . Follow up with your primary care provider in one week.  . Check CBC in the next visit. . Patient has been prescribed Megace twice a day for 15 days.  She will need to follow-up with gynecology to discuss about definite management of fibroid uterus with bleeding  Discharge Diagnosis:   Active Problems:   Symptomatic anemia   Menorrhagia   Blood loss anemia   Chronic systolic CHF (congestive heart failure) ([facility])   Essential hypertension   Pericardial effusion   Discharge Condition: Improved.  Diet recommendation: Low-salt diet  Wound care: None.  Code status: Full.   History of Present Illness:   Kelly Guzman is a 45 y.o. female with medical history significant of systolic CHF EF 92-42%, chronic iron deficiency anemia presented to hospital with heavy vaginal bleeding with clots.  Patient does have history of fibroid uterus and is awaiting for gynecology evaluation as outpatient but due to increased bleeding she presented to hospital.  She was recently admitted to hospital 2 weeks ago for hemoglobin of 3.5 and had received 2 units of packed RBC at that time.  This time patient was again admitted to hospital for blood transfusion and Megace was initiated.    Hospital Course:   Following conditions were addressed during hospitalization as listed below,  Acute on chronic blood loss anemia -secondary to fibroid uterus and menorrhagia.  Patient received 1 unit of packed RBC with improvement in hemoglobin.  Menorrhagia - as per the admitting provider patient was initiated on Megace 20 twice daily.  This will be continued on discharge until she has a follow-up with outpatient GYN.  Dr. Rip Harbour will help  facilitate close FU with his clinic in the outpatient setting.  Chronic systolic CHF  patient remained euvolemic.  He received 1 unit of packed RBC.  Will resume home medications on assessment.  2D echocardiogram on 07/14/2020 showed ejection fraction of 35 to 40% with elevated right ventricular systolic pressure.  Essential hypertension -patient is on Coreg and losartan spironolactone at home will continue.  History of pericardial effusion -stable, plan for outpatient cardiology follow-up   Elevated TSH  but normal free T4.  Monitor as outpatient.  Disposition.  At this time, patient is stable for disposition home with outpatient PCP, gynecology and cardiology follow-up.  Medical Consultants:    None.  Procedures:    PRBC transfusion Subjective:   Today, patient was seen and examined at bedside.  Denies any dizziness lightheadedness nausea vomiting shortness of breath  Discharge Exam:   Vitals:   07/23/20 0643 07/23/20 0919  BP: 135/77 121/70  Pulse: 76 84  Resp: (!) 21 18  Temp: 98.1 F (36.7 C) 98.6 F (37 C)  SpO2: 100% 100%   Vitals:   07/23/20 0145 07/23/20 0623 07/23/20 0643 07/23/20 0919  BP: (!) 153/92 119/64 135/77 121/70  Pulse: 86 82 76 84  Resp: 20 20 (!) 21 18  Temp: 98.9 F (37.2 C) 98 F (36.7 C) 98.1 F (36.7 C) 98.6 F (37 C)  TempSrc: Oral Oral Oral Oral  SpO2: 100% 100% 100% 100%  Weight:      Height:       General: Alert awake, not in obvious distress HENT: pupils equally reacting to light, pallor noted.  Oral mucosa  is moist.  Chest:  Clear breath sounds.  Diminished breath sounds bilaterally. No crackles or wheezes.  CVS: S1 &S2 heard. No murmur.  Regular rate and rhythm. Abdomen: Soft, nontender, nondistended.  Bowel sounds are heard.   Extremities: No cyanosis, clubbing or edema.  Peripheral pulses are palpable. Psych: Alert, awake and oriented, normal mood CNS:  No cranial nerve deficits.  Power equal in all extremities.   Skin:  Warm and dry.  No rashes noted.  The results of significant diagnostics from this hospitalization (including imaging, microbiology, ancillary and laboratory) are listed below for reference.     Diagnostic Studies:   DG Chest 2 View  Result Date: 07/22/2020 CLINICAL DATA:  Dyspnea. EXAM: CHEST - 2 VIEW COMPARISON:  07/14/2020 FINDINGS: The heart is enlarged, similar to prior exam allowing for differences in technique. Small pleural effusions have improved in the interim. Improved bibasilar atelectasis. No pulmonary edema. No acute airspace disease. No acute osseous abnormalities are seen. IMPRESSION: Stable cardiomegaly. Improved small pleural effusions and bibasilar atelectasis since prior. Electronically Signed   By: Keith Rake M.D.   On: 07/22/2020 20:11     Labs:   Basic Metabolic Panel: Recent Labs  Lab 07/17/20 0538 07/18/20 0458 07/22/20 1730 07/23/20 0111  NA 138 136 139 139  K 3.3* 3.8 4.3 3.7  CL 104 105 108 109  CO2 25 23 22  20*  GLUCOSE 101* 107* 98 91  BUN 11 15 10 12   CREATININE 0.52 0.57 0.84 0.76  CALCIUM 8.8* 8.5* 9.5 8.8*  MG  --   --   --  1.8  PHOS  --   --   --  5.5*   GFR Estimated Creatinine Clearance: 71.8 mL/min (by C-G formula based on SCr of 0.76 mg/dL). Liver Function Tests: Recent Labs  Lab 07/17/20 0538 07/18/20 0458 07/23/20 0111  AST 30 31 33  ALT 27 30 40  ALKPHOS 139* 127* 88  BILITOT 1.1 0.8 0.5  PROT 6.9 6.6 6.8  ALBUMIN 3.3* 3.1* 3.6   No results for input(s): LIPASE, AMYLASE in the last 168 hours. No results for input(s): AMMONIA in the last 168 hours. Coagulation profile No results for input(s): INR, PROTIME in the last 168 hours.  CBC: Recent Labs  Lab 07/18/20 0458 07/22/20 1730 07/22/20 2309 07/23/20 0111 07/23/20 1155  WBC 7.4 8.2 7.9 7.2 5.8  NEUTROABS  --  5.7  --  4.4  --   HGB 9.8* 7.3* 6.6* 6.2* 7.3*  HCT 35.6* 25.7* 23.8* 22.4* 24.4*  MCV 72.1* 70.4* 71.3* 72.7* 75.3*  PLT 372 385 391 381 367    Cardiac Enzymes: No results for input(s): CKTOTAL, CKMB, CKMBINDEX, TROPONINI in the last 168 hours. BNP: Invalid input(s): POCBNP CBG: No results for input(s): GLUCAP in the last 168 hours. D-Dimer No results for input(s): DDIMER in the last 72 hours. Hgb A1c No results for input(s): HGBA1C in the last 72 hours. Lipid Profile No results for input(s): CHOL, HDL, LDLCALC, TRIG, CHOLHDL, LDLDIRECT in the last 72 hours. Thyroid function studies Recent Labs    07/23/20 0111  TSH 6.509*   Anemia work up No results for input(s): VITAMINB12, FOLATE, FERRITIN, TIBC, IRON, RETICCTPCT in the last 72 hours. Microbiology Recent Results (from the past 240 hour(s))  Resp Panel by RT-PCR (Flu A&B, Covid) Nasopharyngeal Swab     Status: None   Collection Time: 07/13/20  5:27 PM   Specimen: Nasopharyngeal Swab; Nasopharyngeal(NP) swabs in vial transport medium  Result Value Ref  Range Status   SARS Coronavirus 2 by RT PCR NEGATIVE NEGATIVE Final    Comment: (NOTE) SARS-CoV-2 target nucleic acids are NOT DETECTED.  The SARS-CoV-2 RNA is generally detectable in upper respiratory specimens during the acute phase of infection. The lowest concentration of SARS-CoV-2 viral copies this assay can detect is 138 copies/mL. A negative result does not preclude SARS-Cov-2 infection and should not be used as the sole basis for treatment or other patient management decisions. A negative result may occur with  improper specimen collection/handling, submission of specimen other than nasopharyngeal swab, presence of viral mutation(s) within the areas targeted by this assay, and inadequate number of viral copies(<138 copies/mL). A negative result must be combined with clinical observations, patient history, and epidemiological information. The expected result is Negative.  Fact Sheet for Patients:  EntrepreneurPulse.com.au  Fact Sheet for Healthcare Providers:   IncredibleEmployment.be  This test is no t yet approved or cleared by the [place] FDA and  has been authorized for detection and/or diagnosis of SARS-CoV-2 by FDA under an Emergency Use Authorization (EUA). This EUA will remain  in effect (meaning this test can be used) for the duration of the COVID-19 declaration under Section 564(b)(1) of the Act, 21 U.S.C.section 360bbb-3(b)(1), unless the authorization is terminated  or revoked sooner.       Influenza A by PCR NEGATIVE NEGATIVE Final   Influenza B by PCR NEGATIVE NEGATIVE Final    Comment: (NOTE) The Xpert Xpress SARS-CoV-2/FLU/RSV plus assay is intended as an aid in the diagnosis of influenza from Nasopharyngeal swab specimens and should not be used as a sole basis for treatment. Nasal washings and aspirates are unacceptable for Xpert Xpress SARS-CoV-2/FLU/RSV testing.  Fact Sheet for Patients: EntrepreneurPulse.com.au  Fact Sheet for Healthcare Providers: IncredibleEmployment.be  This test is not yet approved or cleared by the [place] FDA and has been authorized for detection and/or diagnosis of SARS-CoV-2 by FDA under an Emergency Use Authorization (EUA). This EUA will remain in effect (meaning this test can be used) for the duration of the COVID-19 declaration under Section 564(b)(1) of the Act, 21 U.S.C. section 360bbb-3(b)(1), unless the authorization is terminated or revoked.  Performed at [facility], [place] [address], [place], [place] [address]   Resp Panel by RT-PCR (Flu A&B, Covid) Nasopharyngeal Swab     Status: None   Collection Time: 07/22/20  7:43 PM   Specimen: Nasopharyngeal Swab; Nasopharyngeal(NP) swabs in vial transport medium  Result Value Ref Range Status   SARS Coronavirus 2 by RT PCR NEGATIVE NEGATIVE Final    Comment: (NOTE) SARS-CoV-2 target nucleic acids are NOT DETECTED.  The SARS-CoV-2 RNA is generally  detectable in upper respiratory specimens during the acute phase of infection. The lowest concentration of SARS-CoV-2 viral copies this assay can detect is 138 copies/mL. A negative result does not preclude SARS-Cov-2 infection and should not be used as the sole basis for treatment or other patient management decisions. A negative result may occur with  improper specimen collection/handling, submission of specimen other than nasopharyngeal swab, presence of viral mutation(s) within the areas targeted by this assay, and inadequate number of viral copies(<138 copies/mL). A negative result must be combined with clinical observations, patient history, and epidemiological information. The expected result is Negative.  Fact Sheet for Patients:  EntrepreneurPulse.com.au  Fact Sheet for Healthcare Providers:  IncredibleEmployment.be  This test is no t yet approved or cleared by the [place] FDA and  has been authorized for detection and/or diagnosis  of SARS-CoV-2 by FDA under an Emergency Use Authorization (EUA). This EUA will remain  in effect (meaning this test can be used) for the duration of the COVID-19 declaration under Section 564(b)(1) of the Act, 21 U.S.C.section 360bbb-3(b)(1), unless the authorization is terminated  or revoked sooner.       Influenza A by PCR NEGATIVE NEGATIVE Final   Influenza B by PCR NEGATIVE NEGATIVE Final    Comment: (NOTE) The Xpert Xpress SARS-CoV-2/FLU/RSV plus assay is intended as an aid in the diagnosis of influenza from Nasopharyngeal swab specimens and should not be used as a sole basis for treatment. Nasal washings and aspirates are unacceptable for Xpert Xpress SARS-CoV-2/FLU/RSV testing.  Fact Sheet for Patients: EntrepreneurPulse.com.au  Fact Sheet for Healthcare Providers: IncredibleEmployment.be  This test is not yet approved or cleared by the [place] FDA  and has been authorized for detection and/or diagnosis of SARS-CoV-2 by FDA under an Emergency Use Authorization (EUA). This EUA will remain in effect (meaning this test can be used) for the duration of the COVID-19 declaration under Section 564(b)(1) of the Act, 21 U.S.C. section 360bbb-3(b)(1), unless the authorization is terminated or revoked.  Performed at [facility], [place] [address], [place], [place] [address]      Discharge Instructions:   Discharge Instructions    Diet general   Complete by: As directed    Discharge instructions   Complete by: As directed    Follow-up with your primary care physician in 1 week.  Check blood work at that time.  Follow-up with gynecology soon as possible about fibroid uterus management.   Increase activity slowly   Complete by: As directed      Allergies as of 07/23/2020      Reactions   Penicillins Rash      Medication List    TAKE these medications   carvedilol 25 MG tablet Commonly known as: COREG Take 1 tablet (25 mg total) by mouth 2 (two) times daily with a meal.   cetirizine 10 MG tablet Commonly known as: ZYRTEC Take 10 mg by mouth daily.   FUSION PLUS PO Take 1 capsule by mouth daily.   losartan 50 MG tablet Commonly known as: Cozaar Take 1 tablet (50 mg total) by mouth daily.   megestrol 20 MG tablet Commonly known as: MEGACE Take 1 tablet (20 mg total) by mouth 2 (two) times daily for 15 days.   multivitamin with minerals Tabs tablet Take 1 tablet by mouth daily. Gummies   spironolactone 25 MG tablet Commonly known as: ALDACTONE Take 1 tablet (25 mg total) by mouth daily.         Time coordinating discharge: 39 minutes  Signed:  Nakiesha Rumsey  [facility] 07/23/2020, 12:58 PM

## 2020-07-23 NOTE — Progress Notes (Addendum)
After the discharge summary, patient stated that she did not feel comfortable going home today as she has continued to bleed. She wants to  make sure that her condition did not deteriorate and her hemoglobin remained stable by tomorrow..  We will get CBC at 5 PM and in a.m. again.  I have explained to her that her bleeding would need definitive management with gynecology and that she might continue to bleed some by the morning.  She is already on Megace which will be continued at this time.

## 2020-07-23 NOTE — Progress Notes (Signed)
(  Sent page to B. Kyere.) @ [phone]  [place]. Pt refusing blood transfusion to start now. She wants to sleep and requesting I start in the morning, thanks Abby, Therapist, sports

## 2020-07-24 DIAGNOSIS — Z20822 Contact with and (suspected) exposure to covid-19: Secondary | ICD-10-CM | POA: Diagnosis present

## 2020-07-24 DIAGNOSIS — I110 Hypertensive heart disease with heart failure: Secondary | ICD-10-CM | POA: Diagnosis present

## 2020-07-24 DIAGNOSIS — D509 Iron deficiency anemia, unspecified: Secondary | ICD-10-CM | POA: Diagnosis present

## 2020-07-24 DIAGNOSIS — N920 Excessive and frequent menstruation with regular cycle: Secondary | ICD-10-CM | POA: Diagnosis present

## 2020-07-24 DIAGNOSIS — N921 Excessive and frequent menstruation with irregular cycle: Secondary | ICD-10-CM | POA: Diagnosis not present

## 2020-07-24 DIAGNOSIS — Z79899 Other long term (current) drug therapy: Secondary | ICD-10-CM | POA: Diagnosis not present

## 2020-07-24 DIAGNOSIS — Z880 Allergy status to penicillin: Secondary | ICD-10-CM | POA: Diagnosis not present

## 2020-07-24 DIAGNOSIS — D62 Acute posthemorrhagic anemia: Secondary | ICD-10-CM | POA: Diagnosis present

## 2020-07-24 DIAGNOSIS — I10 Essential (primary) hypertension: Secondary | ICD-10-CM | POA: Diagnosis not present

## 2020-07-24 DIAGNOSIS — D259 Leiomyoma of uterus, unspecified: Secondary | ICD-10-CM | POA: Diagnosis present

## 2020-07-24 DIAGNOSIS — D500 Iron deficiency anemia secondary to blood loss (chronic): Secondary | ICD-10-CM | POA: Diagnosis present

## 2020-07-24 DIAGNOSIS — I313 Pericardial effusion (noninflammatory): Secondary | ICD-10-CM | POA: Diagnosis present

## 2020-07-24 DIAGNOSIS — Z28310 Unvaccinated for covid-19: Secondary | ICD-10-CM | POA: Diagnosis not present

## 2020-07-24 DIAGNOSIS — Z8249 Family history of ischemic heart disease and other diseases of the circulatory system: Secondary | ICD-10-CM | POA: Diagnosis not present

## 2020-07-24 DIAGNOSIS — R946 Abnormal results of thyroid function studies: Secondary | ICD-10-CM | POA: Diagnosis present

## 2020-07-24 DIAGNOSIS — I5022 Chronic systolic (congestive) heart failure: Secondary | ICD-10-CM | POA: Diagnosis present

## 2020-07-24 LAB — T3: T3, Total: 158 ng/dL (ref 71–180)

## 2020-07-24 LAB — CBC
HCT: 21.3 % — ABNORMAL LOW (ref 36.0–46.0)
Hemoglobin: 6.3 g/dL — CL (ref 12.0–15.0)
MCH: 22.1 pg — ABNORMAL LOW (ref 26.0–34.0)
MCHC: 29.6 g/dL — ABNORMAL LOW (ref 30.0–36.0)
MCV: 74.7 fL — ABNORMAL LOW (ref 80.0–100.0)
Platelets: 365 10*3/uL (ref 150–400)
RBC: 2.85 MIL/uL — ABNORMAL LOW (ref 3.87–5.11)
WBC: 6 10*3/uL (ref 4.0–10.5)
nRBC: 0 % (ref 0.0–0.2)

## 2020-07-24 LAB — PREPARE RBC (CROSSMATCH)

## 2020-07-24 MED ORDER — SODIUM CHLORIDE 0.9% IV SOLUTION: Freq: Once | INTRAVENOUS | Status: AC

## 2020-07-24 NOTE — Progress Notes (Signed)
PROGRESS NOTE    Kelly Guzman  XNA:[id number] DOB: 03-24-1976 DOA: 07/22/2020 PCP: Nolene Ebbs, MD   Brief Narrative:  Kelly Guzman a 45 y.o.femalewith medical history significant of systolic CHF EF 54-27%, chronic iron deficiency anemia presented to hospital with heavy vaginal bleeding with clots.  Patient does have history of fibroid uterus and is awaiting for gynecology evaluation as outpatient but due to increased bleeding she presented to hospital.  She was recently admitted to hospital 2 weeks ago for hemoglobin of 3.5 and had received 2 units of packed RBC at that time.  This time patient was again admitted to hospital for blood transfusion and Megace was initiated.   Assessment & Plan:   Active Problems:   Symptomatic anemia   Menorrhagia   Blood loss anemia   Chronic systolic CHF (congestive heart failure) (HCC)   Essential hypertension   Pericardial effusion   Acute blood loss anemia   Acute on chronic blood loss anemia-secondary to fibroid uterus and menorrhagia.  Patient received 1 unit of packed RBC with improvement in hemoglobin.  Unfortunately patient's hemoglobin again declined to 6.3, she received 2 units of packed red blood cells April 15.  OB/GYN as below  Menorrhagia -as per the admitting provider patient was initiated on Megace 20 twice daily.  This will be continued on discharge until she has a follow-up with outpatient GYN.  Dr. Rip Harbour will help facilitate close FU with his clinic in the outpatient setting.  Chronic systolic CHF patient remained euvolemic.    Kelly Guzman received 3 unit of packed RBC.  Will resume home medications on assessment.  2D echocardiogram on 07/14/2020 showed ejection fraction of 35 to 40% with elevated right ventricular systolic pressure.  Essential hypertension-patient is on Coreg and losartan spironolactone at home will continue.  History of pericardial effusion-stable, plan for outpatient cardiology follow-up    Elevated TSH but normal free T4.  Monitor as outpatient.  DVT prophylaxis: SCD/Compression stockings  Code Status: full    Code Status Orders  (From admission, onward)         Start     Ordered   07/22/20 2100  Full code  Continuous        07/22/20 2059        Code Status History    Date Active Date Inactive Code Status Order ID Comments User Context   07/13/2020 1733 07/18/2020 2020 Full Code [id number]  Bonnielee Haff, MD Inpatient   Advance Care Planning Activity     Family Communication: with pt today  Disposition Plan: Patient will remain inpatient as she is not medically stable for discharge.  With persistent bleeding and a dropping hemoglobin requiring transfusion of packed red blood cells today. Consults called: None Admission status: Inpatient   Consultants:   Discussed with OB/GYN as noted  Procedures:  DG Chest 2 View  Result Date: 07/22/2020 CLINICAL DATA:  Dyspnea. EXAM: CHEST - 2 VIEW COMPARISON:  07/14/2020 FINDINGS: The heart is enlarged, similar to prior exam allowing for differences in technique. Small pleural effusions have improved in the interim. Improved bibasilar atelectasis. No pulmonary edema. No acute airspace disease. No acute osseous abnormalities are seen. IMPRESSION: Stable cardiomegaly. Improved small pleural effusions and bibasilar atelectasis since prior. Electronically Signed   By: Keith Rake M.D.   On: 07/22/2020 20:11   DG Chest 2 View  Result Date: 07/14/2020 CLINICAL DATA:  Shortness of breath. EXAM: CHEST - 2 VIEW COMPARISON:  No prior. FINDINGS: Cardiomegaly. Bibasilar atelectasis and infiltrates/edema. Small bilateral  pleural effusions. Findings most consistent with CHF. Bibasilar pneumonia cannot be excluded. No pneumothorax. IMPRESSION: Cardiomegaly. Bibasilar atelectasis and infiltrates/edema. Small bilateral pleural effusions. Findings most consistent CHF. Electronically Signed   By: Marcello Moores  Register   On: 07/14/2020 12:44    CT ABDOMEN PELVIS W CONTRAST  Result Date: 07/14/2020 CLINICAL DATA:  Nausea and diffuse abdominal pain. EXAM: CT ABDOMEN AND PELVIS WITH CONTRAST TECHNIQUE: Multidetector CT imaging of the abdomen and pelvis was performed using the standard protocol following bolus administration of intravenous contrast. CONTRAST:  166mL OMNIPAQUE IOHEXOL 300 MG/ML  SOLN COMPARISON:  None. FINDINGS: Lower chest: Moderate bilateral pleural effusions with basilar atelectasis. Cardiac enlargement with small pericardial effusion. Hepatobiliary: Diffuse enlargement of the liver. Heterogeneous poorly defined low-attenuation throughout the liver. Changes may represent a combination of passive hepatic congestion and heterogeneous fatty infiltration. Pericholecystic fluid is nonspecific but likely related to liver disease. No bile duct dilatation. Pancreas: Unremarkable. No pancreatic ductal dilatation or surrounding inflammatory changes. Spleen: Normal in size without focal abnormality. Adrenals/Urinary Tract: Adrenal glands are unremarkable. Kidneys are normal, without renal calculi, focal lesion, or hydronephrosis. Bladder is unremarkable. Stomach/Bowel: Stomach, small bowel, and colon are not abnormally distended. No wall thickening or inflammatory changes are appreciated. Appendix is normal. Vascular/Lymphatic: No significant vascular findings are present. No enlarged abdominal or pelvic lymph nodes. Reproductive: Heterogeneous nodular enlargement of the uterus consistent with uterine fibroids. Largest measures 3.8 cm diameter. Right adnexal cyst measuring 5 cm diameter. Other: Small amount of free fluid in the abdomen and pelvis with mesenteric edema, likely reactive or related to liver disease. Musculoskeletal: No acute or significant osseous findings. IMPRESSION: 1. Diffuse enlargement of the liver with poorly defined low-attenuation changes, likely to represent a combination of passive hepatic congestion and heterogeneous  fatty infiltration. Hepatitis would also be a possibility. 2. Moderate bilateral pleural effusions with basilar atelectasis. 3. Cardiac enlargement with small pericardial effusion. 4. Small amount of free fluid in the abdomen and pelvis with mesenteric edema, likely reactive or related to liver disease. 5. Uterine fibroids. 6. Right adnexal cyst measuring 5 cm diameter. No follow-up imaging recommended. Note: This recommendation does not apply to premenarchal patients and to those with increased risk (genetic, family history, elevated tumor markers or other high-risk factors) of ovarian cancer. Reference: JACR 2020 Feb; 17(2):248-254 Electronically Signed   By: Lucienne Capers M.D.   On: 07/14/2020 02:03   ECHOCARDIOGRAM COMPLETE  Result Date: 07/14/2020    ECHOCARDIOGRAM REPORT   Patient Name:   LEONETTE TISCHER Date of Exam: 07/14/2020 Medical Rec #:  [id number]          Height:       60.0 in Accession #:    [id number]         Weight:       140.1 lb Date of Birth:  08-Dec-1975          BSA:          1.604 m Patient Age:    43 years           BP:           155/111 mmHg Patient Gender: F                  HR:           118 bpm. Exam Location:  Inpatient Procedure: 2D Echo Indications:    Dyspnea R06.00  History:        Patient has no prior history of Echocardiogram examinations.  Sonographer:    Mikki Santee RDCS (AE) Referring Phys: [place]  1. Left ventricular ejection fraction, by estimation, is 35 to 40%. The left ventricle has moderately decreased function. The left ventricle demonstrates global hypokinesis. Indeterminate diastolic filling due to E-A fusion. There is the interventricular septum is flattened in diastole ('D' shaped left ventricle), consistent with right ventricular volume overload.  2. Right ventricular systolic function is moderately reduced. The right ventricular size is mildly enlarged. There is moderately elevated pulmonary artery systolic pressure.  3. Left  atrial size was mildly dilated.  4. Right atrial size was mild to moderately dilated.  5. The pericardial effusion is circumferential. Large pleural effusion in the left lateral region.  6. The mitral valve is normal in structure. Mild to moderate mitral valve regurgitation.  7. Tricuspid valve regurgitation is moderate to severe.  8. The aortic valve is tricuspid. Aortic valve regurgitation is not visualized.  9. The inferior vena cava is dilated in size with <50% respiratory variability, suggesting right atrial pressure of 15 mmHg. FINDINGS  Left Ventricle: Left ventricular ejection fraction, by estimation, is 35 to 40%. The left ventricle has moderately decreased function. The left ventricle demonstrates global hypokinesis. The left ventricular internal cavity size was normal in size. There is no left ventricular hypertrophy. The interventricular septum is flattened in diastole ('D' shaped left ventricle), consistent with right ventricular volume overload. Indeterminate diastolic filling due to E-A fusion. Right Ventricle: The right ventricular size is mildly enlarged. No increase in right ventricular wall thickness. Right ventricular systolic function is moderately reduced. There is moderately elevated pulmonary artery systolic pressure. The tricuspid regurgitant velocity is 3.03 m/s, and with an assumed right atrial pressure of 15 mmHg, the estimated right ventricular systolic pressure is 18.2 mmHg. Left Atrium: Left atrial size was mildly dilated. Right Atrium: Right atrial size was mild to moderately dilated. Pericardium: Trivial pericardial effusion is present. The pericardial effusion is circumferential. Mitral Valve: The mitral valve is normal in structure. Mild to moderate mitral valve regurgitation, with centrally-directed jet. Tricuspid Valve: The tricuspid valve is normal in structure. Tricuspid valve regurgitation is moderate to severe. Aortic Valve: The aortic valve is tricuspid. Aortic valve  regurgitation is not visualized. Pulmonic Valve: The pulmonic valve was normal in structure. Pulmonic valve regurgitation is not visualized. Aorta: The aortic root and ascending aorta are structurally normal, with no evidence of dilitation. Venous: The inferior vena cava is dilated in size with less than 50% respiratory variability, suggesting right atrial pressure of 15 mmHg. IAS/Shunts: No atrial level shunt detected by color flow Doppler. Additional Comments: There is a large pleural effusion in the left lateral region.  LEFT VENTRICLE PLAX 2D LVIDd:         4.90 cm     Diastology LVIDs:         4.00 cm     LV e' medial:    11.70 cm/s LV PW:         1.00 cm     LV E/e' medial:  11.2 LV IVS:        1.10 cm     LV e' lateral:   19.50 cm/s LVOT diam:     1.90 cm     LV E/e' lateral: 6.7 LV SV:         39 LV SV Index:   24 LVOT Area:     2.84 cm  LV Volumes (MOD) LV vol d, MOD A2C: 99.1 ml LV vol d, MOD  A4C: 84.5 ml LV vol s, MOD A2C: 64.8 ml LV vol s, MOD A4C: 52.0 ml LV SV MOD A2C:     34.3 ml LV SV MOD A4C:     84.5 ml LV SV MOD BP:      34.7 ml RIGHT VENTRICLE RV S prime:     700.00 cm/s TAPSE (M-mode): 1.6 cm LEFT ATRIUM             Index       RIGHT ATRIUM           Index LA diam:        3.70 cm 2.31 cm/m  RA Area:     20.00 cm LA Vol (A2C):   79.2 ml 49.36 ml/m RA Volume:   52.00 ml  32.41 ml/m LA Vol (A4C):   47.7 ml 29.73 ml/m LA Biplane Vol: 63.7 ml 39.70 ml/m  AORTIC VALVE LVOT Vmax:   101.00 cm/s LVOT Vmean:  65.100 cm/s LVOT VTI:    0.137 m  AORTA Ao Root diam: 2.40 cm MITRAL VALVE                TRICUSPID VALVE MV Area (PHT): 4.96 cm     TR Peak grad:   36.7 mmHg MV Decel Time: 153 msec     TR Vmax:        303.00 cm/s MV E velocity: 131.00 cm/s MV A velocity: 68.30 cm/s   SHUNTS MV E/A ratio:  1.92         Systemic VTI:  0.14 m                             Systemic Diam: 1.90 cm Dani Gobble Croitoru MD Electronically signed by Sanda Klein MD Signature Date/Time: 07/14/2020/4:18:59 PM    Final    VAS  [place] LOWER EXTREMITY VENOUS (DVT)  Result Date: 07/17/2020  Lower Venous DVT Study Indications: RLE cramping.  Comparison Study: no previous exams Performing Technologist: Rogelia Rohrer  Examination Guidelines: A complete evaluation includes B-mode imaging, spectral Doppler, color Doppler, and power Doppler as needed of all accessible portions of each vessel. Bilateral testing is considered an integral part of a complete examination. Limited examinations for reoccurring indications may be performed as noted. The reflux portion of the exam is performed with the patient in reverse Trendelenburg.  +-----+---------------+---------+-----------+----------+--------------+ RIGHTCompressibilityPhasicitySpontaneityPropertiesThrombus Aging +-----+---------------+---------+-----------+----------+--------------+ CFV  Full           Yes      Yes                                 +-----+---------------+---------+-----------+----------+--------------+   +---------+---------------+---------+-----------+----------+--------------+ LEFT     CompressibilityPhasicitySpontaneityPropertiesThrombus Aging +---------+---------------+---------+-----------+----------+--------------+ CFV      Full           Yes      Yes                                 +---------+---------------+---------+-----------+----------+--------------+ SFJ      Full                                                        +---------+---------------+---------+-----------+----------+--------------+ FV Prox  Full           Yes      Yes                                 +---------+---------------+---------+-----------+----------+--------------+ FV Mid   Full           Yes      Yes                                 +---------+---------------+---------+-----------+----------+--------------+ FV DistalFull           Yes      Yes                                 +---------+---------------+---------+-----------+----------+--------------+  PFV      Full                                                        +---------+---------------+---------+-----------+----------+--------------+ POP      Full           Yes      Yes                                 +---------+---------------+---------+-----------+----------+--------------+ PTV      Full                                                        +---------+---------------+---------+-----------+----------+--------------+ PERO     Full                                                        +---------+---------------+---------+-----------+----------+--------------+     Summary: RIGHT: - There is no evidence of deep vein thrombosis in the lower extremity. - There is no evidence of superficial venous thrombosis.  - No cystic structure found in the popliteal fossa.  LEFT: - No evidence of common femoral vein obstruction.  *See table(s) above for measurements and observations. Electronically signed by Deitra Mayo MD on 07/17/2020 at 6:39:16 PM.    Final      Antimicrobials:   None   Subjective: Patient reports some bleeding, Labs noted to be anemic this morning at 6.3 although patient with minimal symptoms  Objective: Vitals:   07/23/20 2100 07/24/20 0550 07/24/20 1202 07/24/20 1235  BP: 120/66 134/62 137/74 137/79  Pulse: 86 83 90 93  Resp:  16  16  Temp: 98.6 F (37 C) 97.7 F (36.5 C) 98.7 F (37.1 C) 97.8 F (36.6 C)  TempSrc:    Axillary  SpO2: 100% 99% 100% 100%  Weight:      Height:        Intake/Output Summary (Last 24 hours) at 07/24/2020 1310 Last data filed at 07/24/2020 1220 Gross per 24 hour  Intake 520 ml  Output --  Net 520 ml   Filed Weights   07/22/20 2122  Weight: 58.5 kg    Examination:  General exam: Appears calm and comfortable mildly anemic appearing Respiratory system: Clear to auscultation. Respiratory effort normal. Cardiovascular system: S1 & S2 heard, RRR. No JVD, murmurs, rubs, gallops or clicks. No pedal  edema. Gastrointestinal system: Abdomen is nondistended, soft and nontender. No organomegaly or masses felt. Normal bowel sounds heard. Central nervous system: Alert and oriented. No focal neurological deficits. Extremities: Symmetric 5 x 5 power.  Warm well perfused Skin: No rashes, lesions or ulcers Psychiatry: Judgement and insight appear normal. Mood & affect appropriate.     Data Reviewed: I have personally reviewed following labs and imaging studies  CBC: Recent Labs  Lab 07/22/20 1730 07/22/20 2309 07/23/20 0111 07/23/20 1155 07/23/20 1341 07/24/20 0418  WBC 8.2 7.9 7.2 5.8 5.9 6.0  NEUTROABS 5.7  --  4.4  --   --   --   HGB 7.3* 6.6* 6.2* 7.3* 7.6* 6.3*  HCT 25.7* 23.8* 22.4* 24.4* 25.4* 21.3*  MCV 70.4* 71.3* 72.7* 75.3* 74.1* 74.7*  PLT 385 391 381 367 PLATELET CLUMPS NOTED ON SMEAR, UNABLE TO ESTIMATE 756   Basic Metabolic Panel: Recent Labs  Lab 07/18/20 0458 07/22/20 1730 07/23/20 0111  NA 136 139 139  K 3.8 4.3 3.7  CL 105 108 109  CO2 23 22 20*  GLUCOSE 107* 98 91  BUN 15 10 12   CREATININE 0.57 0.84 0.76  CALCIUM 8.5* 9.5 8.8*  MG  --   --  1.8  PHOS  --   --  5.5*   GFR: Estimated Creatinine Clearance: 71.8 mL/min (by C-G formula based on SCr of 0.76 mg/dL). Liver Function Tests: Recent Labs  Lab 07/18/20 0458 07/23/20 0111  AST 31 33  ALT 30 40  ALKPHOS 127* 88  BILITOT 0.8 0.5  PROT 6.6 6.8  ALBUMIN 3.1* 3.6   No results for input(s): LIPASE, AMYLASE in the last 168 hours. No results for input(s): AMMONIA in the last 168 hours. Coagulation Profile: No results for input(s): INR, PROTIME in the last 168 hours. Cardiac Enzymes: No results for input(s): CKTOTAL, CKMB, CKMBINDEX, TROPONINI in the last 168 hours. BNP (last 3 results) No results for input(s): PROBNP in the last 8760 hours. HbA1C: No results for input(s): HGBA1C in the last 72 hours. CBG: No results for input(s): GLUCAP in the last 168 hours. Lipid Profile: No results  for input(s): CHOL, HDL, LDLCALC, TRIG, CHOLHDL, LDLDIRECT in the last 72 hours. Thyroid Function Tests: Recent Labs    07/23/20 0111  TSH 6.509*  FREET4 1.01   Anemia Panel: No results for input(s): VITAMINB12, FOLATE, FERRITIN, TIBC, IRON, RETICCTPCT in the last 72 hours. Sepsis Labs: No results for input(s): PROCALCITON, LATICACIDVEN in the last 168 hours.  Recent Results (from the past 240 hour(s))  Resp Panel by RT-PCR (Flu A&B, Covid) Nasopharyngeal Swab     Status: None   Collection Time: 07/22/20  7:43 PM   Specimen: Nasopharyngeal Swab; Nasopharyngeal(NP) swabs in vial transport medium  Result Value Ref Range Status   SARS Coronavirus 2 by RT PCR NEGATIVE NEGATIVE Final    Comment: (NOTE) SARS-CoV-2 target nucleic acids are NOT DETECTED.  The SARS-CoV-2 RNA is generally detectable in upper respiratory specimens during the acute phase of infection. The lowest concentration of SARS-CoV-2 viral copies this assay can detect is 138 copies/mL. A negative result does not preclude SARS-Cov-2 infection  and should not be used as the sole basis for treatment or other patient management decisions. A negative result may occur with  improper specimen collection/handling, submission of specimen other than nasopharyngeal swab, presence of viral mutation(s) within the areas targeted by this assay, and inadequate number of viral copies(<138 copies/mL). A negative result must be combined with clinical observations, patient history, and epidemiological information. The expected result is Negative.  Fact Sheet for Patients:  EntrepreneurPulse.com.au  Fact Sheet for Healthcare Providers:  IncredibleEmployment.be  This test is no t yet approved or cleared by the [place] FDA and  has been authorized for detection and/or diagnosis of SARS-CoV-2 by FDA under an Emergency Use Authorization (EUA). This EUA will remain  in effect (meaning this test can  be used) for the duration of the COVID-19 declaration under Section 564(b)(1) of the Act, 21 U.S.C.section 360bbb-3(b)(1), unless the authorization is terminated  or revoked sooner.       Influenza A by PCR NEGATIVE NEGATIVE Final   Influenza B by PCR NEGATIVE NEGATIVE Final    Comment: (NOTE) The Xpert Xpress SARS-CoV-2/FLU/RSV plus assay is intended as an aid in the diagnosis of influenza from Nasopharyngeal swab specimens and should not be used as a sole basis for treatment. Nasal washings and aspirates are unacceptable for Xpert Xpress SARS-CoV-2/FLU/RSV testing.  Fact Sheet for Patients: EntrepreneurPulse.com.au  Fact Sheet for Healthcare Providers: IncredibleEmployment.be  This test is not yet approved or cleared by the [place] FDA and has been authorized for detection and/or diagnosis of SARS-CoV-2 by FDA under an Emergency Use Authorization (EUA). This EUA will remain in effect (meaning this test can be used) for the duration of the COVID-19 declaration under Section 564(b)(1) of the Act, 21 U.S.C. section 360bbb-3(b)(1), unless the authorization is terminated or revoked.  Performed at [facility], [place] [address], [place], [place] [address]          Radiology Studies: DG Chest 2 View  Result Date: 07/22/2020 CLINICAL DATA:  Dyspnea. EXAM: CHEST - 2 VIEW COMPARISON:  07/14/2020 FINDINGS: The heart is enlarged, similar to prior exam allowing for differences in technique. Small pleural effusions have improved in the interim. Improved bibasilar atelectasis. No pulmonary edema. No acute airspace disease. No acute osseous abnormalities are seen. IMPRESSION: Stable cardiomegaly. Improved small pleural effusions and bibasilar atelectasis since prior. Electronically Signed   By: Keith Rake M.D.   On: 07/22/2020 20:11        Scheduled Meds: . sodium chloride   Intravenous Once  . carvedilol  25 mg Oral  BID WC  . megestrol  20 mg Oral BID  . sodium chloride flush  3 mL Intravenous Q12H  . spironolactone  25 mg Oral Daily   Continuous Infusions: . sodium chloride       LOS: 0 days    Time spent: 35 min    Nicolette Bang, MD [facility]  If 7PM-7AM, please contact night-coverage  07/24/2020, 1:10 PM

## 2020-07-25 DIAGNOSIS — R7989 Other specified abnormal findings of blood chemistry: Secondary | ICD-10-CM

## 2020-07-25 LAB — TYPE AND SCREEN
ABO/RH(D): A POS
Antibody Screen: NEGATIVE
Unit division: 0
Unit division: 0
Unit division: 0
Unit division: 0

## 2020-07-25 LAB — CBC WITH DIFFERENTIAL/PLATELET
Abs Immature Granulocytes: 0.02 10*3/uL (ref 0.00–0.07)
Basophils Absolute: 0.1 10*3/uL (ref 0.0–0.1)
Basophils Relative: 1 %
Eosinophils Absolute: 0.3 10*3/uL (ref 0.0–0.5)
Eosinophils Relative: 4 %
HCT: 34.1 % — ABNORMAL LOW (ref 36.0–46.0)
Hemoglobin: 10.6 g/dL — ABNORMAL LOW (ref 12.0–15.0)
Immature Granulocytes: 0 %
Lymphocytes Relative: 28 %
Lymphs Abs: 2.1 10*3/uL (ref 0.7–4.0)
MCH: 24.3 pg — ABNORMAL LOW (ref 26.0–34.0)
MCHC: 31.1 g/dL (ref 30.0–36.0)
MCV: 78.2 fL — ABNORMAL LOW (ref 80.0–100.0)
Monocytes Absolute: 0.8 10*3/uL (ref 0.1–1.0)
Monocytes Relative: 11 %
Neutro Abs: 4.3 10*3/uL (ref 1.7–7.7)
Neutrophils Relative %: 56 %
Platelets: 450 10*3/uL — ABNORMAL HIGH (ref 150–400)
RBC: 4.36 MIL/uL (ref 3.87–5.11)
RDW: 27.3 % — ABNORMAL HIGH (ref 11.5–15.5)
WBC: 7.6 10*3/uL (ref 4.0–10.5)
nRBC: 0 % (ref 0.0–0.2)

## 2020-07-25 LAB — BPAM RBC
Blood Product Expiration Date: 202205022359
Blood Product Expiration Date: 202205032359
Blood Product Expiration Date: 202205092359
Blood Product Expiration Date: 202205092359
ISSUE DATE / TIME: 202204140613
ISSUE DATE / TIME: 202204141547
ISSUE DATE / TIME: 202204151211
ISSUE DATE / TIME: 202204151748
Unit Type and Rh: 6200
Unit Type and Rh: 6200
Unit Type and Rh: 6200
Unit Type and Rh: 6200

## 2020-07-25 MED ORDER — MEGESTROL ACETATE 20 MG PO TABS: 20.0000 mg | ORAL_TABLET | Freq: Two times a day (BID) | ORAL | 0 refills | Status: AC

## 2020-07-25 NOTE — Plan of Care (Signed)
  Problem: Education: Goal: Knowledge of General Education information will improve Description: Including pain rating scale, medication(s)/side effects and non-pharmacologic comfort measures Outcome: Adequate for Discharge   Problem: Activity: Goal: Risk for activity intolerance will decrease Outcome: Adequate for Discharge   Problem: Nutrition: Goal: Adequate nutrition will be maintained Outcome: Adequate for Discharge   Problem: Safety: Goal: Ability to remain free from injury will improve Outcome: Adequate for Discharge

## 2020-07-25 NOTE — Discharge Summary (Signed)
Physician Discharge Summary  Kelly Guzman BWL:[id number] DOB: [birth year] DOA: 07/22/2020  PCP: Nolene Ebbs, MD  Admit date: 07/22/2020 Discharge date: 07/25/2020  Admitted From: Inpatient Disposition: home  Recommendations for Outpatient Follow-up:  1. Follow up with PCP in 1-2 weeks 2. Please obtain BMP/CBC in one week 3. Please follow up on the following pending results:  Home Health:No Equipment/Devices:none  Discharge Condition:Stable CODE STATUS:Full code Diet recommendation: Cardiac diet  Brief/Interim Summary: Kelly Livingstonis a 45 y.o.femalewith medical history significant of systolic CHF EF 68-11%, chronic iron deficiency anemia presented to hospital with heavy vaginal bleeding with clots.  Patient does have history of fibroid uterus and is awaiting for gynecology evaluation as outpatient but due to increased bleeding she presented to hospital.  She was recently admitted to hospital 2 weeks ago for hemoglobin of 3.5 and had received 2 units of packed RBC at that time.  This time patient was again admitted to hospital for blood transfusion and Megace was initiated.  Following conditions were addressed during hospitalization as listed below,  Acute on chronic blood loss anemia-secondary to fibroid uterus and menorrhagia.  Patient received 3 unit of packed RBC with improvement in hemoglobin.  Menorrhagia -as per the admitting provider patient was initiated on Megace 20 twice daily.  This will be continued on discharge until she has a follow-up with outpatient GYN.  Dr. Rip Harbour will help facilitate close FU with his clinic in the outpatient setting.  Chronic systolic CHF patient remained euvolemic.  He received 1 unit of packed RBC.  Will resume home medications on assessment.  2D echocardiogram on 07/14/2020 showed ejection fraction of 35 to 40% with elevated right ventricular systolic pressure.  Essential hypertension-patient is on Coreg and losartan  spironolactone at home will continue.  History of pericardial effusion-stable, plan for outpatient cardiology follow-up   Elevated TSH but normal free T4.  Monitor as outpatient.  Disposition.  At this time, patient is stable for disposition home with outpatient PCP, gynecology and cardiology follow-up.  Discharge Diagnoses:  Active Problems:   Symptomatic anemia   Menorrhagia   Blood loss anemia   Chronic systolic CHF (congestive heart failure) (HCC)   Essential hypertension   Pericardial effusion   Acute blood loss anemia    Discharge Instructions  Discharge Instructions    Call MD for:   Complete by: As directed    Any acute change in medical condition to include but not limited to nausea vomiting chest pain shortness of breath or persistent or recurrent abnormal bleeding   Diet - low sodium heart healthy   Complete by: As directed    Diet general   Complete by: As directed    Discharge instructions   Complete by: As directed    Follow-up with your primary care physician in 1 week.  Check blood work at that time.  Follow-up with gynecology soon as possible about fibroid uterus management.   Increase activity slowly   Complete by: As directed    Increase activity slowly   Complete by: As directed      Allergies as of 07/25/2020      Reactions   Penicillins Rash      Medication List    TAKE these medications   carvedilol 25 MG tablet Commonly known as: COREG Take 1 tablet (25 mg total) by mouth 2 (two) times daily with a meal.   cetirizine 10 MG tablet Commonly known as: ZYRTEC Take 10 mg by mouth daily.   FUSION PLUS PO Take 1  capsule by mouth daily.   losartan 50 MG tablet Commonly known as: Cozaar Take 1 tablet (50 mg total) by mouth daily.   megestrol 20 MG tablet Commonly known as: MEGACE Take 1 tablet (20 mg total) by mouth 2 (two) times daily for 15 days.   multivitamin with minerals Tabs tablet Take 1 tablet by mouth daily. Gummies    spironolactone 25 MG tablet Commonly known as: ALDACTONE Take 1 tablet (25 mg total) by mouth daily.       Allergies  Allergen Reactions  . Penicillins Rash     Procedures/Studies: DG Chest 2 View  Result Date: 07/22/2020 CLINICAL DATA:  Dyspnea. EXAM: CHEST - 2 VIEW COMPARISON:  07/14/2020 FINDINGS: The heart is enlarged, similar to prior exam allowing for differences in technique. Small pleural effusions have improved in the interim. Improved bibasilar atelectasis. No pulmonary edema. No acute airspace disease. No acute osseous abnormalities are seen. IMPRESSION: Stable cardiomegaly. Improved small pleural effusions and bibasilar atelectasis since prior. Electronically Signed   By: Keith Rake M.D.   On: 07/22/2020 20:11   DG Chest 2 View  Result Date: 07/14/2020 CLINICAL DATA:  Shortness of breath. EXAM: CHEST - 2 VIEW COMPARISON:  No prior. FINDINGS: Cardiomegaly. Bibasilar atelectasis and infiltrates/edema. Small bilateral pleural effusions. Findings most consistent with CHF. Bibasilar pneumonia cannot be excluded. No pneumothorax. IMPRESSION: Cardiomegaly. Bibasilar atelectasis and infiltrates/edema. Small bilateral pleural effusions. Findings most consistent CHF. Electronically Signed   By: Marcello Moores  Register   On: 07/14/2020 12:44   CT ABDOMEN PELVIS W CONTRAST  Result Date: 07/14/2020 CLINICAL DATA:  Nausea and diffuse abdominal pain. EXAM: CT ABDOMEN AND PELVIS WITH CONTRAST TECHNIQUE: Multidetector CT imaging of the abdomen and pelvis was performed using the standard protocol following bolus administration of intravenous contrast. CONTRAST:  173mL OMNIPAQUE IOHEXOL 300 MG/ML  SOLN COMPARISON:  None. FINDINGS: Lower chest: Moderate bilateral pleural effusions with basilar atelectasis. Cardiac enlargement with small pericardial effusion. Hepatobiliary: Diffuse enlargement of the liver. Heterogeneous poorly defined low-attenuation throughout the liver. Changes may represent a  combination of passive hepatic congestion and heterogeneous fatty infiltration. Pericholecystic fluid is nonspecific but likely related to liver disease. No bile duct dilatation. Pancreas: Unremarkable. No pancreatic ductal dilatation or surrounding inflammatory changes. Spleen: Normal in size without focal abnormality. Adrenals/Urinary Tract: Adrenal glands are unremarkable. Kidneys are normal, without renal calculi, focal lesion, or hydronephrosis. Bladder is unremarkable. Stomach/Bowel: Stomach, small bowel, and colon are not abnormally distended. No wall thickening or inflammatory changes are appreciated. Appendix is normal. Vascular/Lymphatic: No significant vascular findings are present. No enlarged abdominal or pelvic lymph nodes. Reproductive: Heterogeneous nodular enlargement of the uterus consistent with uterine fibroids. Largest measures 3.8 cm diameter. Right adnexal cyst measuring 5 cm diameter. Other: Small amount of free fluid in the abdomen and pelvis with mesenteric edema, likely reactive or related to liver disease. Musculoskeletal: No acute or significant osseous findings. IMPRESSION: 1. Diffuse enlargement of the liver with poorly defined low-attenuation changes, likely to represent a combination of passive hepatic congestion and heterogeneous fatty infiltration. Hepatitis would also be a possibility. 2. Moderate bilateral pleural effusions with basilar atelectasis. 3. Cardiac enlargement with small pericardial effusion. 4. Small amount of free fluid in the abdomen and pelvis with mesenteric edema, likely reactive or related to liver disease. 5. Uterine fibroids. 6. Right adnexal cyst measuring 5 cm diameter. No follow-up imaging recommended. Note: This recommendation does not apply to premenarchal patients and to those with increased risk (genetic, family history, elevated tumor markers  or other high-risk factors) of ovarian cancer. Reference: JACR 2020 Feb; 17(2):248-254 Electronically Signed    By: Lucienne Capers M.D.   On: 07/14/2020 02:03   ECHOCARDIOGRAM COMPLETE  Result Date: 07/14/2020    ECHOCARDIOGRAM REPORT   Patient Name:   BLAIZE NIPPER Date of Exam: 07/14/2020 Medical Rec #:  [id number]          Height:       60.0 in Accession #:    [id number]         Weight:       140.1 lb Date of Birth:  1976/02/26          BSA:          1.604 m Patient Age:    58 years           BP:           155/111 mmHg Patient Gender: F                  HR:           118 bpm. Exam Location:  Inpatient Procedure: 2D Echo Indications:    Dyspnea R06.00  History:        Patient has no prior history of Echocardiogram examinations.  Sonographer:    Mikki Santee RDCS (AE) Referring Phys: [place]  1. Left ventricular ejection fraction, by estimation, is 35 to 40%. The left ventricle has moderately decreased function. The left ventricle demonstrates global hypokinesis. Indeterminate diastolic filling due to E-A fusion. There is the interventricular septum is flattened in diastole ('D' shaped left ventricle), consistent with right ventricular volume overload.  2. Right ventricular systolic function is moderately reduced. The right ventricular size is mildly enlarged. There is moderately elevated pulmonary artery systolic pressure.  3. Left atrial size was mildly dilated.  4. Right atrial size was mild to moderately dilated.  5. The pericardial effusion is circumferential. Large pleural effusion in the left lateral region.  6. The mitral valve is normal in structure. Mild to moderate mitral valve regurgitation.  7. Tricuspid valve regurgitation is moderate to severe.  8. The aortic valve is tricuspid. Aortic valve regurgitation is not visualized.  9. The inferior vena cava is dilated in size with <50% respiratory variability, suggesting right atrial pressure of 15 mmHg. FINDINGS  Left Ventricle: Left ventricular ejection fraction, by estimation, is 35 to 40%. The left ventricle has moderately  decreased function. The left ventricle demonstrates global hypokinesis. The left ventricular internal cavity size was normal in size. There is no left ventricular hypertrophy. The interventricular septum is flattened in diastole ('D' shaped left ventricle), consistent with right ventricular volume overload. Indeterminate diastolic filling due to E-A fusion. Right Ventricle: The right ventricular size is mildly enlarged. No increase in right ventricular wall thickness. Right ventricular systolic function is moderately reduced. There is moderately elevated pulmonary artery systolic pressure. The tricuspid regurgitant velocity is 3.03 m/s, and with an assumed right atrial pressure of 15 mmHg, the estimated right ventricular systolic pressure is 25.3 mmHg. Left Atrium: Left atrial size was mildly dilated. Right Atrium: Right atrial size was mild to moderately dilated. Pericardium: Trivial pericardial effusion is present. The pericardial effusion is circumferential. Mitral Valve: The mitral valve is normal in structure. Mild to moderate mitral valve regurgitation, with centrally-directed jet. Tricuspid Valve: The tricuspid valve is normal in structure. Tricuspid valve regurgitation is moderate to severe. Aortic Valve: The aortic valve is tricuspid. Aortic valve regurgitation is not visualized. Pulmonic  Valve: The pulmonic valve was normal in structure. Pulmonic valve regurgitation is not visualized. Aorta: The aortic root and ascending aorta are structurally normal, with no evidence of dilitation. Venous: The inferior vena cava is dilated in size with less than 50% respiratory variability, suggesting right atrial pressure of 15 mmHg. IAS/Shunts: No atrial level shunt detected by color flow Doppler. Additional Comments: There is a large pleural effusion in the left lateral region.  LEFT VENTRICLE PLAX 2D LVIDd:         4.90 cm     Diastology LVIDs:         4.00 cm     LV e' medial:    11.70 cm/s LV PW:         1.00 cm      LV E/e' medial:  11.2 LV IVS:        1.10 cm     LV e' lateral:   19.50 cm/s LVOT diam:     1.90 cm     LV E/e' lateral: 6.7 LV SV:         39 LV SV Index:   24 LVOT Area:     2.84 cm  LV Volumes (MOD) LV vol d, MOD A2C: 99.1 ml LV vol d, MOD A4C: 84.5 ml LV vol s, MOD A2C: 64.8 ml LV vol s, MOD A4C: 52.0 ml LV SV MOD A2C:     34.3 ml LV SV MOD A4C:     84.5 ml LV SV MOD BP:      34.7 ml RIGHT VENTRICLE RV S prime:     700.00 cm/s TAPSE (M-mode): 1.6 cm LEFT ATRIUM             Index       RIGHT ATRIUM           Index LA diam:        3.70 cm 2.31 cm/m  RA Area:     20.00 cm LA Vol (A2C):   79.2 ml 49.36 ml/m RA Volume:   52.00 ml  32.41 ml/m LA Vol (A4C):   47.7 ml 29.73 ml/m LA Biplane Vol: 63.7 ml 39.70 ml/m  AORTIC VALVE LVOT Vmax:   101.00 cm/s LVOT Vmean:  65.100 cm/s LVOT VTI:    0.137 m  AORTA Ao Root diam: 2.40 cm MITRAL VALVE                TRICUSPID VALVE MV Area (PHT): 4.96 cm     TR Peak grad:   36.7 mmHg MV Decel Time: 153 msec     TR Vmax:        303.00 cm/s MV E velocity: 131.00 cm/s MV A velocity: 68.30 cm/s   SHUNTS MV E/A ratio:  1.92         Systemic VTI:  0.14 m                             Systemic Diam: 1.90 cm Dani Gobble Croitoru MD Electronically signed by Sanda Klein MD Signature Date/Time: 07/14/2020/4:18:59 PM    Final    VAS [place] LOWER EXTREMITY VENOUS (DVT)  Result Date: 07/17/2020  Lower Venous DVT Study Indications: RLE cramping.  Comparison Study: no previous exams Performing Technologist: Rogelia Rohrer  Examination Guidelines: A complete evaluation includes B-mode imaging, spectral Doppler, color Doppler, and power Doppler as needed of all accessible portions of each vessel. Bilateral testing is considered an integral part of a complete  examination. Limited examinations for reoccurring indications may be performed as noted. The reflux portion of the exam is performed with the patient in reverse Trendelenburg.  +-----+---------------+---------+-----------+----------+--------------+  RIGHTCompressibilityPhasicitySpontaneityPropertiesThrombus Aging +-----+---------------+---------+-----------+----------+--------------+ CFV  Full           Yes      Yes                                 +-----+---------------+---------+-----------+----------+--------------+   +---------+---------------+---------+-----------+----------+--------------+ LEFT     CompressibilityPhasicitySpontaneityPropertiesThrombus Aging +---------+---------------+---------+-----------+----------+--------------+ CFV      Full           Yes      Yes                                 +---------+---------------+---------+-----------+----------+--------------+ SFJ      Full                                                        +---------+---------------+---------+-----------+----------+--------------+ FV Prox  Full           Yes      Yes                                 +---------+---------------+---------+-----------+----------+--------------+ FV Mid   Full           Yes      Yes                                 +---------+---------------+---------+-----------+----------+--------------+ FV DistalFull           Yes      Yes                                 +---------+---------------+---------+-----------+----------+--------------+ PFV      Full                                                        +---------+---------------+---------+-----------+----------+--------------+ POP      Full           Yes      Yes                                 +---------+---------------+---------+-----------+----------+--------------+ PTV      Full                                                        +---------+---------------+---------+-----------+----------+--------------+ PERO     Full                                                        +---------+---------------+---------+-----------+----------+--------------+  Summary: RIGHT: - There is no evidence of deep vein  thrombosis in the lower extremity. - There is no evidence of superficial venous thrombosis.  - No cystic structure found in the popliteal fossa.  LEFT: - No evidence of common femoral vein obstruction.  *See table(s) above for measurements and observations. Electronically signed by Deitra Mayo MD on 07/17/2020 at 6:39:16 PM.    Final        Subjective: Patient reports he feels much better today Stable for discharge and requesting to be discharged home today As previously noted discharge held by previous attending secondary to patient concerns of bleeding  Discharge Exam: Vitals:   07/24/20 2131 07/25/20 0710  BP: 140/82 (!) 149/96  Pulse: 88 86  Resp: 20 20  Temp: 98.4 F (36.9 C) 98.5 F (36.9 C)  SpO2: 100% 100%   Vitals:   07/24/20 1757 07/24/20 1820 07/24/20 2131 07/25/20 0710  BP: (!) 149/87 (!) 155/91 140/82 (!) 149/96  Pulse: 90  88 86  Resp: 18 18 20 20   Temp: 98.7 F (37.1 C) 98.7 F (37.1 C) 98.4 F (36.9 C) 98.5 F (36.9 C)  TempSrc: Oral Axillary Oral   SpO2: 100% 100% 100% 100%  Weight:      Height:        General: Pt is alert, awake, not in acute distress Cardiovascular: RRR, S1/S2 +, no rubs, no gallops Respiratory: CTA bilaterally, no wheezing, no rhonchi Abdominal: Soft, NT, ND, bowel sounds + Extremities: no edema, no cyanosis    The results of significant diagnostics from this hospitalization (including imaging, microbiology, ancillary and laboratory) are listed below for reference.     Microbiology: Recent Results (from the past 240 hour(s))  Resp Panel by RT-PCR (Flu A&B, Covid) Nasopharyngeal Swab     Status: None   Collection Time: 07/22/20  7:43 PM   Specimen: Nasopharyngeal Swab; Nasopharyngeal(NP) swabs in vial transport medium  Result Value Ref Range Status   SARS Coronavirus 2 by RT PCR NEGATIVE NEGATIVE Final    Comment: (NOTE) SARS-CoV-2 target nucleic acids are NOT DETECTED.  The SARS-CoV-2 RNA is generally detectable in  upper respiratory specimens during the acute phase of infection. The lowest concentration of SARS-CoV-2 viral copies this assay can detect is 138 copies/mL. A negative result does not preclude SARS-Cov-2 infection and should not be used as the sole basis for treatment or other patient management decisions. A negative result may occur with  improper specimen collection/handling, submission of specimen other than nasopharyngeal swab, presence of viral mutation(s) within the areas targeted by this assay, and inadequate number of viral copies(<138 copies/mL). A negative result must be combined with clinical observations, patient history, and epidemiological information. The expected result is Negative.  Fact Sheet for Patients:  EntrepreneurPulse.com.au  Fact Sheet for Healthcare Providers:  IncredibleEmployment.be  This test is no t yet approved or cleared by the [place] FDA and  has been authorized for detection and/or diagnosis of SARS-CoV-2 by FDA under an Emergency Use Authorization (EUA). This EUA will remain  in effect (meaning this test can be used) for the duration of the COVID-19 declaration under Section 564(b)(1) of the Act, 21 U.S.C.section 360bbb-3(b)(1), unless the authorization is terminated  or revoked sooner.       Influenza A by PCR NEGATIVE NEGATIVE Final   Influenza B by PCR NEGATIVE NEGATIVE Final    Comment: (NOTE) The Xpert Xpress SARS-CoV-2/FLU/RSV plus assay is intended as an aid in the diagnosis of influenza from Nasopharyngeal swab  specimens and should not be used as a sole basis for treatment. Nasal washings and aspirates are unacceptable for Xpert Xpress SARS-CoV-2/FLU/RSV testing.  Fact Sheet for Patients: EntrepreneurPulse.com.au  Fact Sheet for Healthcare Providers: IncredibleEmployment.be  This test is not yet approved or cleared by the [place] FDA and has been  authorized for detection and/or diagnosis of SARS-CoV-2 by FDA under an Emergency Use Authorization (EUA). This EUA will remain in effect (meaning this test can be used) for the duration of the COVID-19 declaration under Section 564(b)(1) of the Act, 21 U.S.C. section 360bbb-3(b)(1), unless the authorization is terminated or revoked.  Performed at [facility], [place] [address], [place], [place] [address]      Labs: BNP (last 3 results) Recent Labs    07/14/20 1211  BNP 4,403.4*   Basic Metabolic Panel: Recent Labs  Lab 07/22/20 1730 07/23/20 0111  NA 139 139  K 4.3 3.7  CL 108 109  CO2 22 20*  GLUCOSE 98 91  BUN 10 12  CREATININE 0.84 0.76  CALCIUM 9.5 8.8*  MG  --  1.8  PHOS  --  5.5*   Liver Function Tests: Recent Labs  Lab 07/23/20 0111  AST 33  ALT 40  ALKPHOS 88  BILITOT 0.5  PROT 6.8  ALBUMIN 3.6   No results for input(s): LIPASE, AMYLASE in the last 168 hours. No results for input(s): AMMONIA in the last 168 hours. CBC: Recent Labs  Lab 07/22/20 1730 07/22/20 2309 07/23/20 0111 07/23/20 1155 07/23/20 1341 07/24/20 0418 07/25/20 0014  WBC 8.2   < > 7.2 5.8 5.9 6.0 7.6  NEUTROABS 5.7  --  4.4  --   --   --  4.3  HGB 7.3*   < > 6.2* 7.3* 7.6* 6.3* 10.6*  HCT 25.7*   < > 22.4* 24.4* 25.4* 21.3* 34.1*  MCV 70.4*   < > 72.7* 75.3* 74.1* 74.7* 78.2*  PLT 385   < > 381 367 PLATELET CLUMPS NOTED ON SMEAR, UNABLE TO ESTIMATE 365 450*   < > = values in this interval not displayed.   Cardiac Enzymes: No results for input(s): CKTOTAL, CKMB, CKMBINDEX, TROPONINI in the last 168 hours. BNP: Invalid input(s): POCBNP CBG: No results for input(s): GLUCAP in the last 168 hours. D-Dimer No results for input(s): DDIMER in the last 72 hours. Hgb A1c No results for input(s): HGBA1C in the last 72 hours. Lipid Profile No results for input(s): CHOL, HDL, LDLCALC, TRIG, CHOLHDL, LDLDIRECT in the last 72 hours. Thyroid function  studies Recent Labs    07/23/20 0111  TSH 6.509*   Anemia work up No results for input(s): VITAMINB12, FOLATE, FERRITIN, TIBC, IRON, RETICCTPCT in the last 72 hours. Urinalysis    Component Value Date/Time   COLORURINE YELLOW 07/13/2020 1826   APPEARANCEUR CLEAR 07/13/2020 1826   LABSPEC 1.013 07/13/2020 1826   PHURINE 6.0 07/13/2020 1826   GLUCOSEU NEGATIVE 07/13/2020 1826   HGBUR NEGATIVE 07/13/2020 1826   BILIRUBINUR NEGATIVE 07/13/2020 1826   KETONESUR NEGATIVE 07/13/2020 1826   PROTEINUR 30 (A) 07/13/2020 1826   UROBILINOGEN 0.2 08/26/2010 1427   NITRITE NEGATIVE 07/13/2020 1826   LEUKOCYTESUR NEGATIVE 07/13/2020 1826   Sepsis Labs Invalid input(s): PROCALCITONIN,  WBC,  LACTICIDVEN Microbiology Recent Results (from the past 240 hour(s))  Resp Panel by RT-PCR (Flu A&B, Covid) Nasopharyngeal Swab     Status: None   Collection Time: 07/22/20  7:43 PM   Specimen: Nasopharyngeal Swab; Nasopharyngeal(NP) swabs in vial  transport medium  Result Value Ref Range Status   SARS Coronavirus 2 by RT PCR NEGATIVE NEGATIVE Final    Comment: (NOTE) SARS-CoV-2 target nucleic acids are NOT DETECTED.  The SARS-CoV-2 RNA is generally detectable in upper respiratory specimens during the acute phase of infection. The lowest concentration of SARS-CoV-2 viral copies this assay can detect is 138 copies/mL. A negative result does not preclude SARS-Cov-2 infection and should not be used as the sole basis for treatment or other patient management decisions. A negative result may occur with  improper specimen collection/handling, submission of specimen other than nasopharyngeal swab, presence of viral mutation(s) within the areas targeted by this assay, and inadequate number of viral copies(<138 copies/mL). A negative result must be combined with clinical observations, patient history, and epidemiological information. The expected result is Negative.  Fact Sheet for Patients:   EntrepreneurPulse.com.au  Fact Sheet for Healthcare Providers:  IncredibleEmployment.be  This test is no t yet approved or cleared by the [place] FDA and  has been authorized for detection and/or diagnosis of SARS-CoV-2 by FDA under an Emergency Use Authorization (EUA). This EUA will remain  in effect (meaning this test can be used) for the duration of the COVID-19 declaration under Section 564(b)(1) of the Act, 21 U.S.C.section 360bbb-3(b)(1), unless the authorization is terminated  or revoked sooner.       Influenza A by PCR NEGATIVE NEGATIVE Final   Influenza B by PCR NEGATIVE NEGATIVE Final    Comment: (NOTE) The Xpert Xpress SARS-CoV-2/FLU/RSV plus assay is intended as an aid in the diagnosis of influenza from Nasopharyngeal swab specimens and should not be used as a sole basis for treatment. Nasal washings and aspirates are unacceptable for Xpert Xpress SARS-CoV-2/FLU/RSV testing.  Fact Sheet for Patients: EntrepreneurPulse.com.au  Fact Sheet for Healthcare Providers: IncredibleEmployment.be  This test is not yet approved or cleared by the [place] FDA and has been authorized for detection and/or diagnosis of SARS-CoV-2 by FDA under an Emergency Use Authorization (EUA). This EUA will remain in effect (meaning this test can be used) for the duration of the COVID-19 declaration under Section 564(b)(1) of the Act, 21 U.S.C. section 360bbb-3(b)(1), unless the authorization is terminated or revoked.  Performed at [facility], [place] [address] [place], [place] [address]      Time coordinating discharge: Over 30 minutes  SIGNED:   Nicolette Bang, MD  [facility] 07/25/2020, 10:18 AM Pager   If 7PM-7AM, please contact night-coverage [email or web address] Password [email or web address]

## 2020-07-25 NOTE — Discharge Instructions (Signed)
[employer] medicine (25th ed., pp. [phone]). [place], [place]: Elsevier.">  Anemia  Anemia is a condition in which there is not enough red blood cells or hemoglobin in the blood. Hemoglobin is a substance in red blood cells that carries oxygen. When you do not have enough red blood cells or hemoglobin (are anemic), your body cannot get enough oxygen and your organs may not work properly. As a result, you may feel very tired or have other problems. What are the causes? Common causes of anemia include:  Excessive bleeding. Anemia can be caused by excessive bleeding inside or outside the body, including bleeding from the intestines or from heavy menstrual periods in females.  Poor nutrition.  Long-lasting (chronic) kidney, thyroid, and liver disease.  Bone marrow disorders, spleen problems, and blood disorders.  Cancer and treatments for cancer.  HIV (human immunodeficiency virus) and AIDS (acquired immunodeficiency syndrome).  Infections, medicines, and autoimmune disorders that destroy red blood cells. What are the signs or symptoms? Symptoms of this condition include:  Minor weakness.  Dizziness.  Headache, or difficulties concentrating and sleeping.  Heartbeats that feel irregular or faster than normal (palpitations).  Shortness of breath, especially with exercise.  Pale skin, lips, and nails, or cold hands and feet.  Indigestion and nausea. Symptoms may occur suddenly or develop slowly. If your anemia is mild, you may not have symptoms. How is this diagnosed? This condition is diagnosed based on blood tests, your medical history, and a physical exam. In some cases, a test may be needed in which cells are removed from the soft tissue inside of a bone and looked at under a microscope (bone marrow biopsy). Your health care provider may also check your stool (feces) for blood and may do additional testing to look for the cause of your bleeding. Other tests may  include:  Imaging tests, such as a CT scan or MRI.  A procedure to see inside your esophagus and stomach (endoscopy).  A procedure to see inside your colon and rectum (colonoscopy). How is this treated? Treatment for this condition depends on the cause. If you continue to lose a lot of blood, you may need to be treated at a hospital. Treatment may include:  Taking supplements of iron, vitamin Q68, or folic acid.  Taking a hormone medicine (erythropoietin) that can help to stimulate red blood cell growth.  Having a blood transfusion. This may be needed if you lose a lot of blood.  Making changes to your diet.  Having surgery to remove your spleen. Follow these instructions at home:  Take over-the-counter and prescription medicines only as told by your health care provider.  Take supplements only as told by your health care provider.  Follow any diet instructions that you were given by your health care provider.  Keep all follow-up visits as told by your health care provider. This is important. Contact a health care provider if:  You develop new bleeding anywhere in the body. Get help right away if:  You are very weak.  You are short of breath.  You have pain in your abdomen or chest.  You are dizzy or feel faint.  You have trouble concentrating.  You have bloody stools, black stools, or tarry stools.  You vomit repeatedly or you vomit up blood. These symptoms may represent a serious problem that is an emergency. Do not wait to see if the symptoms will go away. Get medical help right away. Call your local emergency services (911 in the [place] Do not  drive yourself to the hospital. Summary  Anemia is a condition in which you do not have enough red blood cells or enough of a substance in your red blood cells that carries oxygen (hemoglobin).  Symptoms may occur suddenly or develop slowly.  If your anemia is mild, you may not have symptoms.  This condition is  diagnosed with blood tests, a medical history, and a physical exam. Other tests may be needed.  Treatment for this condition depends on the cause of the anemia. This information is not intended to replace advice given to you by your health care provider. Make sure you discuss any questions you have with your health care provider. Document Revised: 03/05/2019 Document Reviewed: 03/05/2019 Elsevier Patient Education  2021 Elsevier Inc.  

## 2020-08-10 ENCOUNTER — Ambulatory Visit: Payer: Medicaid Other | Admitting: Obstetrics and Gynecology

## 2020-08-13 NOTE — Progress Notes (Signed)
Cardiology Office Note:    Date:  08/14/2020   ID:  Kelly Guzman, DOB [birth year], MRN [id number]  PCP:  Nolene Ebbs, MD   [facility] Providers Cardiologist:  Mertie Moores, MD     Referring MD: Nolene Ebbs, MD   Chief Complaint  Patient presents with  . Congestive Heart Failure     Aug 14, 2020   Kelly Guzman is a 45 y.o. female with a hx of iron deficiency anemia, HTN She was admitted to the hospital [facility]) with severe anemia ( Hb = 3)  Tachycardia EF 35-40% RV volume overload  Mod - severe TR She was started on Entresto, coreg, spironolactone   Still having lots of vaginal spotting  Last Hb was 10.6 on April 16,   Could not afford entresto Is on Losartan 50 mg  Breathing is better.  Needs to avoid salt intake .  Is not taking her iron tabs. Thought they made her cramp more     Past Medical History:  Diagnosis Date  . Anemia     Past Surgical History:  Procedure Laterality Date  . CESAREAN SECTION     gave birth to her daughter around 2002    Current Medications: Current Meds  Medication Sig  . carvedilol (COREG) 25 MG tablet Take 1 tablet (25 mg total) by mouth 2 (two) times daily with a meal.  . cetirizine (ZYRTEC) 10 MG tablet Take 10 mg by mouth daily.  . Iron-FA-B Cmp-C-Biot-Probiotic (FUSION PLUS PO) Take 1 capsule by mouth daily.  Marland Kitchen losartan (COZAAR) 100 MG tablet Take 1 tablet (100 mg total) by mouth daily.  . megestrol (MEGACE) 20 MG tablet Take 20 mg by mouth 2 (two) times daily.  . Multiple Vitamin (MULTIVITAMIN WITH MINERALS) TABS tablet Take 1 tablet by mouth daily. Gummies  . spironolactone (ALDACTONE) 25 MG tablet Take 1 tablet (25 mg total) by mouth daily.  . [DISCONTINUED] losartan (COZAAR) 50 MG tablet Take 1 tablet (50 mg total) by mouth daily.     Allergies:   Penicillins   Social History   Socioeconomic History  . Marital status: Single    Spouse name: Not on file  . Number of children: 1  . Years of  education: Not on file  . Highest education level: Not on file  Occupational History  . Not on file  Tobacco Use  . Smoking status: Never Smoker  . Smokeless tobacco: Never Used  Vaping Use  . Vaping Use: Never used  Substance and Sexual Activity  . Alcohol use: Never  . Drug use: Never  . Sexual activity: Not on file  Other Topics Concern  . Not on file  Social History Narrative  . Not on file   Social Determinants of Health   Financial Resource Strain: Not on file  Food Insecurity: Not on file  Transportation Needs: Not on file  Physical Activity: Not on file  Stress: Not on file  Social Connections: Not on file     Family History: The patient's family history includes Diabetes in her father and mother; Heart failure in her father and paternal grandfather; Hypertension in her mother.  ROS:   Please see the history of present illness.     All other systems reviewed and are negative.  EKGs/Labs/Other Studies Reviewed:    The following studies were reviewed today:   EKG:     Recent Labs: 07/14/2020: B Natriuretic Peptide 1,133.9 07/23/2020: ALT 40; BUN 12; Creatinine, Ser 0.76; Magnesium 1.8; Potassium  3.7; Sodium 139; TSH 6.509 07/25/2020: Hemoglobin 10.6; Platelets 450  Recent Lipid Panel No results found for: CHOL, TRIG, HDL, CHOLHDL, VLDL, LDLCALC, LDLDIRECT   Risk Assessment/Calculations:       Physical Exam:    VS:  BP 140/90   Pulse 96   Ht 5' (1.524 m)   Wt 130 lb (59 kg)   LMP 07/22/2020   SpO2 99%   BMI 25.39 kg/m     Wt Readings from Last 3 Encounters:  08/14/20 130 lb (59 kg)  07/22/20 128 lb 15.5 oz (58.5 kg)  07/18/20 126 lb 8.7 oz (57.4 kg)     GEN:  Well nourished, well developed in no acute distress HEENT: Normal NECK: No JVD; No carotid bruits LYMPHATICS: No lymphadenopathy CARDIAC: RRR, no murmurs, rubs, gallops RESPIRATORY:  Clear to auscultation without rales, wheezing or rhonchi  ABDOMEN: Soft, non-tender,  non-distended MUSCULOSKELETAL:  No edema; No deformity  SKIN: Warm and dry NEUROLOGIC:  Alert and oriented x 3 PSYCHIATRIC:  Normal affect   ASSESSMENT:    1. Chronic systolic CHF (congestive heart failure) ([place])   2. Acute blood loss anemia    PLAN:      1. Acute on chronic combined systolic and diastolic congestive heart failure: She recovered presents for further evaluation of and follow-up of her congestive heart failure.  She was seen in the [facility] about a month ago with severe heart failure related to anemia.  Her anemia was due to very heavy menstrual bleeding.  She has been transfused and is feeling quite a bit better.  She is now on carvedilol, spironolactone, losartan.  Blood pressure remains mildly elevated.  We will increase the losartan to 100 mg a day.  Will anticipate getting an echocardiogram in approximately 2 months and I will see her 1 month for follow-up visit.  2.  Iron deficient anemia: She continues to have lots of vaginal bleeding.  She will not take her iron tablets because she thought that those made her cramps worse.  I have advised her to discuss this with her GYN doctor.         Medication Adjustments/Labs and Tests Ordered: Current medicines are reviewed at length with the patient today.  Concerns regarding medicines are outlined above.  Orders Placed This Encounter  Procedures  . Basic metabolic panel  . ECHOCARDIOGRAM COMPLETE   Meds ordered this encounter  Medications  . losartan (COZAAR) 100 MG tablet    Sig: Take 1 tablet (100 mg total) by mouth daily.    Dispense:  90 tablet    Refill:  3    Patient Instructions  Medication Instructions:  Your physician has recommended you make the following change in your medication:   INCREASE Losartan to 100mg  daily  *If you need a refill on your cardiac medications before your next appointment, please call your pharmacy*   Lab Work: Your physician recommends that you return for  lab work in:3 weeks  Testing/Procedures: DUE IN 2 months Your physician has requested that you have an echocardiogram. Echocardiography is a painless test that uses sound waves to create images of your heart. It provides your doctor with information about the size and shape of your heart and how well your heart's chambers and valves are working. This procedure takes approximately one hour. There are no restrictions for this procedure.     Follow-Up: At [facility], you and your health needs are our priority.  As part of our continuing mission to  provide you with exceptional heart care, we have created designated Provider Care Teams.  These Care Teams include your primary Cardiologist (physician) and Advanced Practice Providers (APPs -  Physician Assistants and Nurse Practitioners) who all work together to provide you with the care you need, when you need it.  We recommend signing up for the patient portal called "MyChart".  Sign up information is provided on this After Visit Summary.  MyChart is used to connect with patients for Virtual Visits (Telemedicine).  Patients are able to view lab/test results, encounter notes, upcoming appointments, etc.  Non-urgent messages can be sent to your provider as well.   To learn more about what you can do with MyChart, go to [email or web address].    Your next appointment:   3 month(s)  The format for your next appointment:   In Person  Provider:   Mertie Moores, MD      Signed, Mertie Moores, MD  08/14/2020 2:46 PM    [place]

## 2020-08-14 ENCOUNTER — Other Ambulatory Visit: Payer: Self-pay

## 2020-08-14 ENCOUNTER — Ambulatory Visit (INDEPENDENT_AMBULATORY_CARE_PROVIDER_SITE_OTHER): Payer: Medicaid Other | Admitting: Cardiovascular Disease

## 2020-08-14 ENCOUNTER — Encounter: Payer: Self-pay | Admitting: Cardiovascular Disease

## 2020-08-14 VITALS — BP 140/90 | HR 96 | Ht 60.0 in | Wt 130.0 lb

## 2020-08-14 DIAGNOSIS — I5022 Chronic systolic (congestive) heart failure: Secondary | ICD-10-CM

## 2020-08-14 DIAGNOSIS — D62 Acute posthemorrhagic anemia: Secondary | ICD-10-CM | POA: Diagnosis not present

## 2020-08-14 MED ORDER — LOSARTAN POTASSIUM 100 MG PO TABS: 100.0000 mg | ORAL_TABLET | Freq: Every day | ORAL | 3 refills | Status: DC

## 2020-08-14 NOTE — Patient Instructions (Signed)
Medication Instructions:  Your physician has recommended you make the following change in your medication:   INCREASE Losartan to 100mg  daily  *If you need a refill on your cardiac medications before your next appointment, please call your pharmacy*   Lab Work: Your physician recommends that you return for lab work in:3 weeks  Testing/Procedures: DUE IN 2 months Your physician has requested that you have an echocardiogram. Echocardiography is a painless test that uses sound waves to create images of your heart. It provides your doctor with information about the size and shape of your heart and how well your heart's chambers and valves are working. This procedure takes approximately one hour. There are no restrictions for this procedure.     Follow-Up: At [facility], you and your health needs are our priority.  As part of our continuing mission to provide you with exceptional heart care, we have created designated Provider Care Teams.  These Care Teams include your primary Cardiologist (physician) and Advanced Practice Providers (APPs -  Physician Assistants and Nurse Practitioners) who all work together to provide you with the care you need, when you need it.  We recommend signing up for the patient portal called "MyChart".  Sign up information is provided on this After Visit Summary.  MyChart is used to connect with patients for Virtual Visits (Telemedicine).  Patients are able to view lab/test results, encounter notes, upcoming appointments, etc.  Non-urgent messages can be sent to your provider as well.   To learn more about what you can do with MyChart, go to [email or web address].    Your next appointment:   3 month(s)  The format for your next appointment:   In Person  Provider:   Mertie Moores, MD

## 2020-08-19 ENCOUNTER — Other Ambulatory Visit: Payer: Self-pay

## 2020-08-19 MED ORDER — SPIRONOLACTONE 25 MG PO TABS: 25.0000 mg | ORAL_TABLET | Freq: Every day | ORAL | 11 refills | Status: DC

## 2020-08-19 MED ORDER — CARVEDILOL 25 MG PO TABS: 25.0000 mg | ORAL_TABLET | Freq: Two times a day (BID) | ORAL | 11 refills | Status: DC

## 2020-09-02 ENCOUNTER — Other Ambulatory Visit: Payer: Self-pay | Admitting: Internal Medicine

## 2020-09-03 LAB — BASIC METABOLIC PANEL WITH GFR
BUN: 11 mg/dL (ref 7–25)
CO2: 22 mmol/L (ref 20–32)
Calcium: 10 mg/dL (ref 8.6–10.2)
Chloride: 106 mmol/L (ref 98–110)
Creat: 0.65 mg/dL (ref 0.50–1.10)
GFR, Est African American: 125 mL/min/{1.73_m2} (ref >=60)
GFR, Est Non African American: 108 mL/min/{1.73_m2} (ref >=60)
Glucose, Bld: 72 mg/dL (ref 65–99)
Potassium: 4.1 mmol/L (ref 3.5–5.3)
Sodium: 138 mmol/L (ref 135–146)

## 2020-09-03 LAB — CBC
HCT: 36.6 % (ref 35.0–45.0)
Hemoglobin: 11.3 g/dL — ABNORMAL LOW (ref 11.7–15.5)
MCH: 23.6 pg — ABNORMAL LOW (ref 27.0–33.0)
MCHC: 30.9 g/dL — ABNORMAL LOW (ref 32.0–36.0)
MCV: 76.6 fL — ABNORMAL LOW (ref 80.0–100.0)
MPV: 9.3 fL (ref 7.5–12.5)
Platelets: 531 10*3/uL — ABNORMAL HIGH (ref 140–400)
RBC: 4.78 10*6/uL (ref 3.80–5.10)
RDW: 21.9 % — ABNORMAL HIGH (ref 11.0–15.0)
WBC: 8.2 10*3/uL (ref 3.8–10.8)

## 2020-09-04 ENCOUNTER — Other Ambulatory Visit: Payer: Medicaid Other | Admitting: *Deleted

## 2020-09-04 ENCOUNTER — Other Ambulatory Visit: Payer: Self-pay

## 2020-09-04 DIAGNOSIS — I5022 Chronic systolic (congestive) heart failure: Secondary | ICD-10-CM

## 2020-09-05 LAB — BASIC METABOLIC PANEL
BUN/Creatinine Ratio: 15 (ref 9–23)
BUN: 12 mg/dL (ref 6–24)
CO2: 20 mmol/L (ref 20–29)
Calcium: 9.9 mg/dL (ref 8.7–10.2)
Chloride: 106 mmol/L (ref 96–106)
Creatinine, Ser: 0.79 mg/dL (ref 0.57–1.00)
Glucose: 92 mg/dL (ref 65–99)
Potassium: 4 mmol/L (ref 3.5–5.2)
Sodium: 141 mmol/L (ref 134–144)
eGFR: 95 mL/min/{1.73_m2} (ref >59)

## 2020-09-24 ENCOUNTER — Telehealth: Payer: Self-pay | Admitting: Cardiovascular Disease

## 2020-09-24 NOTE — Telephone Encounter (Signed)
Patient called wanting to know if Dr. Cathie Olden received her disability papers. Please advise

## 2020-09-24 NOTE — Telephone Encounter (Signed)
Spoke with pt and made her aware of lab results.  Pt verbalized understanding.    Advised pt that I did not see that we have received her disability paperwork yet.  She states it may not have gotten here yet from the company. Advised once received, our medical records dept will be in contact with her because she will have to pay a fee and sign release paperwork before we would complete the forms.  Pt verbalized understanding.

## 2020-09-24 NOTE — Telephone Encounter (Signed)
Patient also wanted to know her labs results

## 2020-10-19 ENCOUNTER — Other Ambulatory Visit (HOSPITAL_COMMUNITY): Payer: Medicaid Other

## 2020-10-20 ENCOUNTER — Other Ambulatory Visit: Payer: Self-pay | Admitting: Internal Medicine

## 2020-10-20 LAB — CBC
HCT: 27.3 % — ABNORMAL LOW (ref 35.0–45.0)
Hemoglobin: 8.1 g/dL — ABNORMAL LOW (ref 11.7–15.5)
MCH: 20.7 pg — ABNORMAL LOW (ref 27.0–33.0)
MCHC: 29.7 g/dL — ABNORMAL LOW (ref 32.0–36.0)
MCV: 69.8 fL — ABNORMAL LOW (ref 80.0–100.0)
MPV: 9.4 fL (ref 7.5–12.5)
Platelets: 599 10*3/uL — ABNORMAL HIGH (ref 140–400)
RBC: 3.91 10*6/uL (ref 3.80–5.10)
RDW: 20.9 % — ABNORMAL HIGH (ref 11.0–15.0)
WBC: 6.9 10*3/uL (ref 3.8–10.8)

## 2020-11-04 ENCOUNTER — Ambulatory Visit (HOSPITAL_COMMUNITY): Payer: Medicaid Other | Attending: Cardiovascular Disease

## 2020-11-04 ENCOUNTER — Other Ambulatory Visit: Payer: Self-pay

## 2020-11-04 DIAGNOSIS — I5022 Chronic systolic (congestive) heart failure: Secondary | ICD-10-CM | POA: Diagnosis present

## 2020-11-04 LAB — ECHOCARDIOGRAM COMPLETE
Area-P 1/2: 3.63 cm2
S' Lateral: 3.2 cm

## 2020-11-08 ENCOUNTER — Encounter: Payer: Self-pay | Admitting: Cardiovascular Disease

## 2020-11-08 NOTE — Progress Notes (Signed)
This encounter was created in error - please disregard.

## 2020-11-09 ENCOUNTER — Encounter: Payer: Medicaid Other | Admitting: Cardiovascular Disease

## 2020-11-17 ENCOUNTER — Ambulatory Visit (INDEPENDENT_AMBULATORY_CARE_PROVIDER_SITE_OTHER): Payer: Medicaid Other | Admitting: Obstetrics and Gynecology

## 2020-11-17 ENCOUNTER — Other Ambulatory Visit: Payer: Self-pay

## 2020-11-17 ENCOUNTER — Encounter: Payer: Self-pay | Admitting: Obstetrics and Gynecology

## 2020-11-17 VITALS — BP 150/95 | HR 97 | Ht 60.0 in | Wt 148.0 lb

## 2020-11-17 DIAGNOSIS — D500 Iron deficiency anemia secondary to blood loss (chronic): Secondary | ICD-10-CM | POA: Diagnosis not present

## 2020-11-17 DIAGNOSIS — N921 Excessive and frequent menstruation with irregular cycle: Secondary | ICD-10-CM | POA: Diagnosis not present

## 2020-11-17 DIAGNOSIS — D259 Leiomyoma of uterus, unspecified: Secondary | ICD-10-CM

## 2020-11-17 NOTE — Progress Notes (Signed)
Pt states that she has been having prolong periods of bleeding.  Pt states she has had issues with Hgb and has issues with taking Iron.  Pt is currently taking Megace and Spironolactone.   Pt states that bleeding has gotten lighter and is not bleeding at this time.   Pt had u/s at [facility].  Pt has pap smear with PCP- ? Due in Sept.

## 2020-11-17 NOTE — Progress Notes (Signed)
  CC: fibroids, heavy bleeding Subjective:    Patient ID: Kelly Guzman, female    DOB: [birth year], 45 y.o.   MRN: [id number]  HPI 45 yo G2P1, cesarean section x 1, seen at [facility] office to discuss fibroid uterus and treatment options.  The patient's health is complicated by hypertension and congestive heart failure.  Pt notes 4 years of heavy bleeding as well as anemia.  Pt has been placed on megace recently and her bleeding has decreased.  Menses lasts 4 days, but they are irregular and she can bleed all throughout the month.  The pt is adamant that she only wants surgical intervention by myomectomy because she wants to retain fertility.  Discussed with pt that fertility naturally decreases with age , especially over the age of 39. It may be difficult to become pregnant with advanced maternal age and there is also increased risk of chromosomal/ genetic issues.  Discussed with pt there are several options both medical and surgical to treat the fibroid uterus, but they may not be compatible with fertility.  Pt was seen in 5/22 by another OB/gyn practice and was essentially told the same thing.  These records are in care everywhere.  Pt was recommended to be seen by [facility] at [facility] for further treatment.  Nsure if pt made this appointment.     Review of Systems  Genitourinary:  Positive for menstrual problem, pelvic pain and vaginal bleeding.      Objective:   Physical Exam Constitutional:      Appearance: Normal appearance. She is normal weight.  HENT:     Head: Normocephalic and atraumatic.  Cardiovascular:     Rate and Rhythm: Normal rate and regular rhythm.     Heart sounds: Normal heart sounds.  Pulmonary:     Effort: Pulmonary effort is normal.     Breath sounds: Normal breath sounds.  Abdominal:     General: Abdomen is flat.     Comments: Firm mass felt in lower abdomen  Genitourinary:    Comments: SVE: irregularly shaped uterus, approximately  12-14 week  size Neurological:     Mental Status: She is alert.   Vitals:   11/17/20 1412  BP: (!) 150/95  Pulse: 97   U/s received after pt left office: At least 8 fibroids noted in 11.77cm x 9.55 cm x 9.96 cm uterus Fibroids 1.10 -5.6cm Endometrium distorted      Assessment & Plan:   1. Blood loss anemia Blood loss currently managed with megace  2. Uterine leiomyoma, unspecified location Outside u/s scanned into records - US PELVIC COMPLETE WITH TRANSVAGINAL; Future  3. Menorrhagia with irregular cycle Pt requested another ultrasound even though she just had one 08/2020.  Advised that there would likely be little change in fibroids over 2-3 months.  Pt adamant that she wanted the procedure.  Would agree with the other OBGYN practice, pt would be best served by minimally invasive surgery  if she truly wants myomectomy only. - US PELVIC COMPLETE WITH TRANSVAGINAL; Future  I spent 30 minutes dedicated to the care of this patient including previsit review of records, face to face time with the patient discussing her condition, treatment options  and post visit testing.    F/u in 1 month Griffin Basil, MD Faculty Attending, [facility] for [facility]

## 2020-11-26 ENCOUNTER — Ambulatory Visit (HOSPITAL_COMMUNITY): Payer: Medicaid Other

## 2020-12-11 ENCOUNTER — Ambulatory Visit: Payer: Medicaid Other | Admitting: Cardiovascular Disease

## 2021-01-04 ENCOUNTER — Encounter (HOSPITAL_COMMUNITY): Payer: Self-pay

## 2021-01-04 ENCOUNTER — Emergency Department (HOSPITAL_COMMUNITY)
Admission: EM | Admit: 2021-01-04 | Discharge: 2021-01-05 | Disposition: A | Discharge status: Home / Self Care | Payer: Medicaid Other | Attending: Emergency Medicine | Admitting: Emergency Medicine

## 2021-01-04 ENCOUNTER — Other Ambulatory Visit: Payer: Self-pay

## 2021-01-04 ENCOUNTER — Emergency Department (HOSPITAL_COMMUNITY): Payer: Medicaid Other

## 2021-01-04 DIAGNOSIS — Z79899 Other long term (current) drug therapy: Secondary | ICD-10-CM | POA: Diagnosis not present

## 2021-01-04 DIAGNOSIS — D649 Anemia, unspecified: Secondary | ICD-10-CM | POA: Diagnosis present

## 2021-01-04 DIAGNOSIS — N939 Abnormal uterine and vaginal bleeding, unspecified: Secondary | ICD-10-CM | POA: Diagnosis not present

## 2021-01-04 DIAGNOSIS — N9489 Other specified conditions associated with female genital organs and menstrual cycle: Secondary | ICD-10-CM | POA: Diagnosis not present

## 2021-01-04 DIAGNOSIS — I5043 Acute on chronic combined systolic (congestive) and diastolic (congestive) heart failure: Secondary | ICD-10-CM | POA: Diagnosis not present

## 2021-01-04 DIAGNOSIS — I110 Hypertensive heart disease with heart failure: Secondary | ICD-10-CM | POA: Diagnosis not present

## 2021-01-04 DIAGNOSIS — D259 Leiomyoma of uterus, unspecified: Secondary | ICD-10-CM | POA: Insufficient documentation

## 2021-01-04 DIAGNOSIS — D219 Benign neoplasm of connective and other soft tissue, unspecified: Secondary | ICD-10-CM

## 2021-01-04 LAB — COMPREHENSIVE METABOLIC PANEL
ALT: 23 U/L (ref 0–44)
AST: 24 U/L (ref 15–41)
Albumin: 4.3 g/dL (ref 3.5–5.0)
Alkaline Phosphatase: 40 U/L (ref 38–126)
Anion gap: 6 (ref 5–15)
BUN: 8 mg/dL (ref 6–20)
CO2: 24 mmol/L (ref 22–32)
Calcium: 9.9 mg/dL (ref 8.9–10.3)
Chloride: 114 mmol/L — ABNORMAL HIGH (ref 98–111)
Creatinine, Ser: 0.61 mg/dL (ref 0.44–1.00)
GFR, Estimated: >60 mL/min (ref >60)
Glucose, Bld: 84 mg/dL (ref 70–99)
Potassium: 3.6 mmol/L (ref 3.5–5.1)
Sodium: 144 mmol/L (ref 135–145)
Total Bilirubin: 0.4 mg/dL (ref 0.3–1.2)
Total Protein: 8.3 g/dL — ABNORMAL HIGH (ref 6.5–8.1)

## 2021-01-04 LAB — CBC WITH DIFFERENTIAL/PLATELET
Abs Immature Granulocytes: 0.02 10*3/uL (ref 0.00–0.07)
Basophils Absolute: 0.1 10*3/uL (ref 0.0–0.1)
Basophils Relative: 1 %
Eosinophils Absolute: 0.1 10*3/uL (ref 0.0–0.5)
Eosinophils Relative: 2 %
HCT: 29 % — ABNORMAL LOW (ref 36.0–46.0)
Hemoglobin: 7.8 g/dL — ABNORMAL LOW (ref 12.0–15.0)
Immature Granulocytes: 0 %
Lymphocytes Relative: 25 %
Lymphs Abs: 2.4 10*3/uL (ref 0.7–4.0)
MCH: 16.8 pg — ABNORMAL LOW (ref 26.0–34.0)
MCHC: 26.9 g/dL — ABNORMAL LOW (ref 30.0–36.0)
MCV: 62.4 fL — ABNORMAL LOW (ref 80.0–100.0)
Monocytes Absolute: 0.8 10*3/uL (ref 0.1–1.0)
Monocytes Relative: 8 %
Neutro Abs: 6.2 10*3/uL (ref 1.7–7.7)
Neutrophils Relative %: 64 %
Platelets: 631 10*3/uL — ABNORMAL HIGH (ref 150–400)
RBC: 4.65 MIL/uL (ref 3.87–5.11)
RDW: 23.3 % — ABNORMAL HIGH (ref 11.5–15.5)
WBC: 9.6 10*3/uL (ref 4.0–10.5)
nRBC: 0 % (ref 0.0–0.2)

## 2021-01-04 LAB — HCG, SERUM, QUALITATIVE: Preg, Serum: NEGATIVE

## 2021-01-04 LAB — HEMOGLOBIN AND HEMATOCRIT, BLOOD
HCT: 26.5 % — ABNORMAL LOW (ref 36.0–46.0)
Hemoglobin: 7.4 g/dL — ABNORMAL LOW (ref 12.0–15.0)

## 2021-01-04 MED ORDER — SODIUM CHLORIDE 0.9 % IV SOLN: 10.0000 mL/h | Freq: Once | INTRAVENOUS | Status: DC

## 2021-01-04 NOTE — ED Notes (Addendum)
Pt returned from bathroom, stated she is having heavier bleeding and cramping, MD notified

## 2021-01-04 NOTE — ED Provider Notes (Signed)
Emergency Medicine Provider Triage Evaluation Note  Kelly Guzman , a 45 y.o. female  was evaluated in triage.  Pt complains of generalized weakness.  Symptoms have been present for a few days, worsening today.  Saw PCP, was found to have low hemoglobin.  History of similar due to fibroids and heavy menstruation.  Recently switched from to a new medication with OB/GYN.  Has had 2 transfusions before.  Currently on her period.  No fevers, cough, chest pain, nausea, vomiting, abdominal pain  Review of Systems  Positive: fatigue Negative: fever  Physical Exam  BP (!) 181/93 (BP Location: Left Arm)   Pulse 83   Temp 98.1 F (36.7 C) (Oral)   Resp 16   Ht 5' (1.524 m)   Wt 69.4 kg   LMP 01/02/2021 (Approximate)   SpO2 100%   BMI 29.88 kg/m  Gen:   Awake, no distress   Resp:  Normal effort  MSK:   Moves extremities without difficulty  Other:  No ttp of abd  Medical Decision Making  Medically screening exam initiated at 4:56 PM.  Appropriate orders placed.  Kelly Guzman was informed that the remainder of the evaluation will be completed by another provider, this initial triage assessment does not replace that evaluation, and the importance of remaining in the ED until their evaluation is complete.  Labs, ekg   [place], [place] 01/04/21 1706    Valarie Merino, MD 01/04/21 2351

## 2021-01-04 NOTE — ED Triage Notes (Signed)
Patient reports a history of heavy menstrual periods. Patient was told to come to the ED for a hgb-8.1.

## 2021-01-04 NOTE — ED Notes (Signed)
This nurse attempted to start an IV on the pt. Pt stated, "you can't go there it's uncomfortable for me." This nurse stated to the pt that her AC veins were adequate to use in that area. The pt stated to this nurse that this nurse could also not use her posterior forearm veins. This nurse explained to the pt that her anterior forearm veins are deep however this nurse would attempt to access them despite the fact that the pt's AC veins are adequate and phlebotomy had used her left AC vein. This nurse attempted to access the pt's left anterior forearm vein, however, it blew. The pt then declined having her IV started by this nurse and this nurse exited the exam room. Provider notified and aware.

## 2021-01-04 NOTE — ED Notes (Addendum)
This nurse entered the pt's room to attach pt to cardiac monitor x3 and start an IV. This nurse explained to the pt that she would need to undress from the waist down d/t the pt's complaint of vaginal bleeding, as well as her c/o low hgb. This nurse explained to the pt that a pelvic exam may be necessary and a transvaginal US has been ordered. The pt stated to this nurse, "oh that won't be necessary, I'm here for a blood transfusion. The nurse then stated to the pt that the pt would need to remove her t-shirt which the pt still had on under her gown, so this nurse could apply cardiac leads. The pt stated to this nurse that she would just "lift her shirt up" so this nurse could attach the cardiac leads. This nurse then stated to the pt that the pt would need to sit on the ED stretcher. The pt had to be asked three times by this nurse to please sit on the ED stretcher. Upon sitting on ED stretcher, pt leaned her nose toward mattress of stretcher and said "something smells funny." This nurse stated to the pt that the stretcher is bleached in between each patient use and new linens are placed on the stretcher.

## 2021-01-04 NOTE — ED Provider Notes (Signed)
[facility] DEPT Provider Note   CSN: [id number] Arrival date & time: 01/04/21  1639     History Chief Complaint  Patient presents with   Abnormal Lab    Kelly Guzman is a 45 y.o. female.  45 year old female with history of fibroids, heart failure with reduced ejection fraction presents ER secondary to heavy vaginal bleeding.  She follows with OB/GYN as outpatient, on MEGACE, has refused hysterectomy in the past.  Reports over the past 2 weeks she is having worsening to her vaginal bleeding, went through 1 pad 1 to 2 hours.  Feels though that is improved from prior to evaluation emergency department.  She is ambulate difficulty, no shortness of breath, fever, chills, nausea or vomiting.  No change to bowel or bladder function.  Minimal pelvic discomfort not much worsened from her baseline.  Has received blood transfusion in the past.  Good compliance to medications.  Not taking iron supplements. Intermittent cramping a/w bleeding which she chronically has with this.   The history is provided by the patient. No language interpreter was used.  Abnormal Lab     Past Medical History:  Diagnosis Date   Anemia    Heart failure ([place])    Hypertension     Patient Active Problem List   Diagnosis Date Noted   Fibroid uterus 11/17/2020   Acute blood loss anemia 07/24/2020   Blood loss anemia 86/57/8469   Chronic systolic CHF (congestive heart failure) ([place]) 07/22/2020   Essential hypertension 07/22/2020   Pericardial effusion 07/22/2020   Acute on chronic combined systolic and diastolic CHF (congestive heart failure) ([facility])    Symptomatic anemia 07/13/2020   Pedal edema 07/13/2020   Tachycardia 07/13/2020   Iron deficiency 07/13/2020   Menorrhagia 07/13/2020    Past Surgical History:  Procedure Laterality Date   CESAREAN SECTION     gave birth to her daughter around 7     OB History     Gravida  1   Para  1   Term  1   Preterm       AB      Living         SAB      IAB      Ectopic      Multiple      Live Births              Family History  Problem Relation Age of Onset   Diabetes Mother    Hypertension Mother    Diabetes Father    Heart failure Father    Heart failure Paternal Grandfather     Social History   Tobacco Use   Smoking status: Never   Smokeless tobacco: Never  Vaping Use   Vaping Use: Never used  Substance Use Topics   Alcohol use: Never   Drug use: Never    Home Medications Prior to Admission medications   Medication Sig Start Date End Date Taking? Authorizing Provider  carvedilol (COREG) 25 MG tablet Take 1 tablet (25 mg total) by mouth 2 (two) times daily with a meal. 08/19/20  Yes Nahser, Wonda Cheng, MD  cetirizine (ZYRTEC) 10 MG tablet Take 10 mg by mouth daily. 07/10/20  Yes [provider]  losartan (COZAAR) 50 MG tablet Take 50 mg by mouth daily. 12/30/20  Yes [provider]  naproxen (NAPROSYN) 500 MG tablet Take 1 tablet (500 mg total) by mouth 2 (two) times daily for 7 days. 01/05/21 01/12/21 Yes Pearline Cables,  Dajane Valli A, DO  norethindrone (AYGESTIN) 5 MG tablet Take 10 mg by mouth at bedtime. 12/21/20  Yes [provider]  spironolactone (ALDACTONE) 25 MG tablet Take 1 tablet (25 mg total) by mouth daily. 08/19/20  Yes Nahser, Wonda Cheng, MD  Iron-FA-B Cmp-C-Biot-Probiotic (FUSION PLUS PO) Take 1 capsule by mouth daily. Patient not taking: No sig reported    [provider]  losartan (COZAAR) 100 MG tablet Take 1 tablet (100 mg total) by mouth daily. Patient not taking: Reported on 01/04/2021 08/14/20   Nahser, Wonda Cheng, MD  megestrol (MEGACE) 20 MG tablet Take 20 mg by mouth 2 (two) times daily. Patient not taking: Reported on 01/04/2021 08/11/20   [provider]  Multiple Vitamin (MULTIVITAMIN WITH MINERALS) TABS tablet Take 1 tablet by mouth daily. Gummies Patient not taking: No sig reported    [provider]    Allergies     Penicillins  Review of Systems   Review of Systems  Constitutional:  Negative for chills and fever.  HENT:  Negative for facial swelling and trouble swallowing.   Eyes:  Negative for photophobia and visual disturbance.  Respiratory:  Negative for cough and shortness of breath.   Cardiovascular:  Negative for chest pain and palpitations.  Gastrointestinal:  Negative for abdominal pain, nausea and vomiting.  Endocrine: Negative for polydipsia and polyuria.  Genitourinary:  Positive for pelvic pain and vaginal bleeding. Negative for difficulty urinating and hematuria.  Musculoskeletal:  Negative for gait problem and joint swelling.  Skin:  Negative for pallor and rash.  Neurological:  Negative for syncope and headaches.  Psychiatric/Behavioral:  Negative for agitation and confusion.    Physical Exam Updated Vital Signs BP (!) 162/87   Pulse 86   Temp 98.1 F (36.7 C) (Oral)   Resp 20   Ht 5' (1.524 m)   Wt 69.4 kg   LMP 01/02/2021 (Approximate)   SpO2 100%   BMI 29.88 kg/m   Physical Exam Vitals and nursing note reviewed.  Constitutional:      General: She is not in acute distress.    Appearance: Normal appearance.  HENT:     Head: Normocephalic and atraumatic.     Right Ear: External ear normal.     Left Ear: External ear normal.     Nose: Nose normal.     Mouth/Throat:     Mouth: Mucous membranes are moist.  Eyes:     General: No scleral icterus.       Right eye: No discharge.        Left eye: No discharge.  Cardiovascular:     Rate and Rhythm: Normal rate and regular rhythm.     Pulses: Normal pulses.     Heart sounds: Normal heart sounds.  Pulmonary:     Effort: Pulmonary effort is normal. No respiratory distress.     Breath sounds: Normal breath sounds.  Abdominal:     General: Abdomen is flat.     Tenderness: There is abdominal tenderness in the suprapubic area.     Comments: Minimal TTP, not peritoneal.   Genitourinary:    Comments: Patient refused  pelvic exam Musculoskeletal:        General: Normal range of motion.     Cervical back: Normal range of motion.     Right lower leg: No edema.     Left lower leg: No edema.  Skin:    General: Skin is warm and dry.     Capillary Refill: Capillary  refill takes less than 2 seconds.  Neurological:     Mental Status: She is alert and oriented to person, place, and time.     GCS: GCS eye subscore is 4. GCS verbal subscore is 5. GCS motor subscore is 6.  Psychiatric:        Mood and Affect: Mood normal.        Behavior: Behavior normal.    ED Results / Procedures / Treatments   Labs (all labs ordered are listed, but only abnormal results are displayed) Labs Reviewed  CBC WITH DIFFERENTIAL/PLATELET - Abnormal; Notable for the following components:      Result Value   Hemoglobin 7.8 (*)    HCT 29.0 (*)    MCV 62.4 (*)    MCH 16.8 (*)    MCHC 26.9 (*)    RDW 23.3 (*)    Platelets 631 (*)    All other components within normal limits  COMPREHENSIVE METABOLIC PANEL - Abnormal; Notable for the following components:   Chloride 114 (*)    Total Protein 8.3 (*)    All other components within normal limits  HEMOGLOBIN AND HEMATOCRIT, BLOOD - Abnormal; Notable for the following components:   Hemoglobin 7.4 (*)    HCT 26.5 (*)    All other components within normal limits  HCG, SERUM, QUALITATIVE  LACTATE DEHYDROGENASE  IRON AND TIBC  FERRITIN  VITAMIN B12  TYPE AND SCREEN  PREPARE RBC (CROSSMATCH)    EKG None  Radiology US PELVIS (TRANSABDOMINAL ONLY)  Result Date: 01/04/2021 CLINICAL DATA:  45 year old female with vaginal bleeding. Premenopausal. C-section. EXAM: TRANSABDOMINAL AND TRANSVAGINAL ULTRASOUND OF PELVIS TECHNIQUE: Both transabdominal and transvaginal ultrasound examinations of the pelvis were performed. Transabdominal technique was performed for global imaging of the pelvis including uterus, ovaries, adnexal regions, and pelvic cul-de-sac. It was necessary to proceed  with endovaginal exam following the transabdominal exam to visualize the endometrium. COMPARISON:  CT 07/14/2020 FINDINGS: Uterus Measurements: 14.6 x 8.9 x 7.9 cm = volume: 539 mL. Multiple round masses with heterogeneous echotexture consistent with benign leiomyoma. Mass in the anterior wall measuring 5.4 cm. Mass in the posterior wall measuring 4.6 cm. Mass in the fundus measuring 4.6 cm. Endometrium Thickness: Limited evaluation due to multiple fibroids. 7.2 mm which is within normal limits for premenopausal female. Right ovary Measurements: 2.7 x 2.2 x 2.3 cm = volume: 7.1 mL. Normal appearance/no adnexal mass. Left ovary Measurements: 1.8 x 1.8 x 2.1 cm = volume: 3.8 mL. Normal appearance/no adnexal mass. Other findings No abnormal free fluid. IMPRESSION: 1. Multiple uterine fibroids. 2. Limited view of the endometrium appears within normal limits. 3. Normal ovaries. Electronically Signed   By: Suzy Bouchard M.D.   On: 01/04/2021 19:55    Procedures .Critical Care Performed by: Jeanell Sparrow, DO Authorized by: Jeanell Sparrow, DO   Critical care provider statement:    Critical care time (minutes):  32   Critical care time was exclusive of:  Separately billable procedures and treating other patients   Critical care was necessary to treat or prevent imminent or life-threatening deterioration of the following conditions:  Circulatory failure   Critical care was time spent personally by me on the following activities:  Discussions with consultants, evaluation of patient's response to treatment, examination of patient, ordering and performing treatments and interventions, ordering and review of laboratory studies, ordering and review of radiographic studies, pulse oximetry, re-evaluation of patient's condition, obtaining history from patient or surrogate and review of old charts  Medications Ordered in ED Medications  0.9 %  sodium chloride infusion (has no administration in time range)    ED  Course  I have reviewed the triage vital signs and the nursing notes.  Pertinent labs & imaging results that were available during my care of the patient were reviewed by me and considered in my medical decision making (see chart for details).    MDM Rules/Calculators/A&P                           This patient complains of vaginal bleeding; this involves an extensive number of treatment options and is a complaint that carries with it a high risk of complications and morbidity. Vital signs reviewed and are stable. Serious etiologies considered.   I ordered, reviewed and interpreted labs, hemoglobin mildly reduced from her baseline.  She has ongoing vaginal bleeding. She was consented for [facility] given ongoing bleeding, mild hgb drop. Iron studies sent.  I ordered imaging studies which included pelvic US and I independently visualized and interpreted imaging which showed not significantly changed from prior  Previous records obtained and reviewed   I consulted OBGYN and discussed lab and imaging findings  D/w OBGYN, medication adjustments recommended. They will follow up with her tomorrow regarding o/p clinic appointment. Dr Harolyn Rutherford recommends increasing norethindrone to 15mg  qd until bleeding stops, also aleve 500mg  bid x7 days.   At this time patient signed out to incoming ED physician Dr Dayna Barker.     Final Clinical Impression(s) / ED Diagnoses Final diagnoses:  Vaginal bleeding  Anemia, unspecified type  Fibroid    Rx / DC Orders ED Discharge Orders          Ordered    naproxen (NAPROSYN) 500 MG tablet  2 times daily        01/05/21 0031             Jeanell Sparrow, DO 01/05/21 0034

## 2021-01-05 DIAGNOSIS — D259 Leiomyoma of uterus, unspecified: Secondary | ICD-10-CM

## 2021-01-05 DIAGNOSIS — N939 Abnormal uterine and vaginal bleeding, unspecified: Secondary | ICD-10-CM

## 2021-01-05 LAB — IRON AND TIBC
Iron: 11 ug/dL — ABNORMAL LOW (ref 28–170)
Saturation Ratios: 2 % — ABNORMAL LOW (ref 10.4–31.8)
TIBC: 586 ug/dL — ABNORMAL HIGH (ref 250–450)
UIBC: 575 ug/dL

## 2021-01-05 LAB — VITAMIN B12: Vitamin B-12: 197 pg/mL (ref 180–914)

## 2021-01-05 LAB — FERRITIN: Ferritin: 2 ng/mL — ABNORMAL LOW (ref 11–307)

## 2021-01-05 LAB — LACTATE DEHYDROGENASE: LDH: 150 U/L (ref 98–192)

## 2021-01-05 LAB — PREPARE RBC (CROSSMATCH)

## 2021-01-05 MED ORDER — NAPROXEN 500 MG PO TABS: 500.0000 mg | ORAL_TABLET | Freq: Two times a day (BID) | ORAL | 0 refills | Status: AC

## 2021-01-05 NOTE — ED Notes (Signed)
Pt refuses to have her blood pressure taken at this time. Pt states the cuff is too tight and bothers her arm.

## 2021-01-05 NOTE — ED Notes (Addendum)
Pt refusing to wear bp cuff. Pt states it is " too tight on her arm."

## 2021-01-05 NOTE — Discharge Instructions (Addendum)
Please take 15mg  of your Norethindrone (3 tablets) daily until your bleeding stops. Follow up with OBGYN, you should expect to receive their call tomorrow regarding appointment.

## 2021-01-05 NOTE — ED Notes (Signed)
Patient verbalizes understanding of discharge instructions. Opportunity for questioning and answers were provided. Armband removed by staff, pt discharged from ED. Wheeled out to lobby  

## 2021-01-05 NOTE — ED Provider Notes (Signed)
12:45 AM Assumed care from Dr. Earl Lites, please see their note for full history, physical and decision making until this point. In brief this is a 45 y.o. year old female who presented to the ED tonight with Abnormal Lab     Anemic from vag bleeding 2/2 fibroids, refused pelvic. Previous provdier d/w Ob and plan to change megace and close outpatietn follow up (today). Getting transfusion per patient request and then can be discharged.   Patient requested 2nd unit but understood why it wasn't reasonable to give it to her. Then requesting to stay here until her transportation could pick her up but that could be multiple hours, discussed the inability for [place] to keep her here for many hours, discharged.   Discharge instructions, including strict return precautions for new or worsening symptoms, given. Patient and/or family verbalized understanding and agreement with the plan as described.   Labs, studies and imaging reviewed by myself and considered in medical decision making if ordered. Imaging interpreted by radiology.  Labs Reviewed  CBC WITH DIFFERENTIAL/PLATELET - Abnormal; Notable for the following components:      Result Value   Hemoglobin 7.8 (*)    HCT 29.0 (*)    MCV 62.4 (*)    MCH 16.8 (*)    MCHC 26.9 (*)    RDW 23.3 (*)    Platelets 631 (*)    All other components within normal limits  COMPREHENSIVE METABOLIC PANEL - Abnormal; Notable for the following components:   Chloride 114 (*)    Total Protein 8.3 (*)    All other components within normal limits  HEMOGLOBIN AND HEMATOCRIT, BLOOD - Abnormal; Notable for the following components:   Hemoglobin 7.4 (*)    HCT 26.5 (*)    All other components within normal limits  HCG, SERUM, QUALITATIVE  LACTATE DEHYDROGENASE  IRON AND TIBC  FERRITIN  VITAMIN B12  TYPE AND SCREEN  PREPARE RBC (CROSSMATCH)    US PELVIS (TRANSABDOMINAL ONLY)  Final Result      No follow-ups on file.    Kelly Guzman, Corene Cornea, MD 01/07/21 [phone]

## 2021-01-05 NOTE — Consult Note (Addendum)
     GYN Telephone Consult  01/05/2021  Kelly Guzman is a 45 y.o. G1P1 presenting to [facility] ER with abnormal uterine bleeding (AUB) in the setting of known fibroids. I was called for a consult regarding the care of this patient by Dr. Alma Friendly.                                      .    The provider had a clinical question about management of patient's AUB.  The provider presented the following relevant clinical information: Patient is hemodynamically stable, pulse 86, but hemoglobin is 7.4 (was 8.1 on 10/20/20). She is receiving transfusion of one unit of pRBCs.  Reports episodes of moderate bleeding and cramping.  Currently on Norethindrone 5 mg qd.   Ultrasound done today showed 15 week sized uterus with multiple fibroids, three of the biggest ones were myometrial about 5 cm in size. 7.2 mm endometrial stripe. Of note, patient had a remarkable cardiac history of acute on chronic combined systolic and diastolic CHF (congestive heart failure) and HTN.   I performed a chart review on the patient and reviewed available documentation. Patient was last seen at [facility] by Dr. Elgie Congo on 11/17/20, they were discussing possible minimally invasive surgery  BP (!) 163/99   Pulse 98   Temp 98.1 F (36.7 C) (Oral)   Resp 15   Ht 5' (1.524 m)   Wt 69.4 kg   LMP 01/02/2021 (Approximate)   SpO2 100%   BMI 29.88 kg/m   Exam- performed by consulting provider  Recommendations:  -Agree with ongoing transfusion of one unit of pRBC -Increase Norethindrone to 15 mg qd for now until she is seen in the office.  Alsoadd Naproxen 500 mg po bid x 7 days to help with the bleeding and cramping -Sent message to [facility] to schedule the patient to follow up with Dr. Elgie Congo or other MD to discuss further long term AUB/fibroid management, especially given her complicated cardiac history. -If bleeding worsens or for any other urgent concerning signs/symptoms, patient should present to [facility] ER for evaluation  Thank you  for this consult and if additional recommendations are needed please call [phone] for the OB/GYN Consult Attending on service at [facility] and [place]  I spent approximately 10 minutes directly consulting with the provider and verbally discussing this case. Additionally 15 minutes was spent performing chart review and documentation.    [name], MD, [place] for [employer], [place]

## 2021-01-06 ENCOUNTER — Ambulatory Visit: Payer: Medicaid Other | Admitting: Obstetrics and Gynecology

## 2021-01-06 LAB — TYPE AND SCREEN
ABO/RH(D): A POS
Antibody Screen: NEGATIVE
Unit division: 0

## 2021-01-06 LAB — BPAM RBC
Blood Product Expiration Date: 202210152359
ISSUE DATE / TIME: 202209270246
Unit Type and Rh: 6200

## 2021-02-08 ENCOUNTER — Ambulatory Visit: Payer: Medicaid Other | Admitting: Cardiovascular Disease

## 2021-02-15 ENCOUNTER — Other Ambulatory Visit: Payer: Self-pay | Admitting: Internal Medicine

## 2021-02-16 LAB — LIPID PANEL
Cholesterol: 142 mg/dL (ref <200)
HDL: 45 mg/dL — ABNORMAL LOW (ref >=50)
LDL Cholesterol (Calc): 78 mg/dL (calc)
Non-HDL Cholesterol (Calc): 97 mg/dL (calc) (ref <130)
Total CHOL/HDL Ratio: 3.2 (calc) (ref <5.0)
Triglycerides: 104 mg/dL (ref <150)

## 2021-02-16 LAB — COMPLETE METABOLIC PANEL WITH GFR
AG Ratio: 1.4 (calc) (ref 1.0–2.5)
ALT: 21 U/L (ref 6–29)
AST: 20 U/L (ref 10–35)
Albumin: 4.6 g/dL (ref 3.6–5.1)
Alkaline phosphatase (APISO): 46 U/L (ref 31–125)
BUN: 11 mg/dL (ref 7–25)
CO2: 20 mmol/L (ref 20–32)
Calcium: 9.9 mg/dL (ref 8.6–10.2)
Chloride: 109 mmol/L (ref 98–110)
Creat: 0.74 mg/dL (ref 0.50–0.99)
Globulin: 3.4 g/dL (calc) (ref 1.9–3.7)
Glucose, Bld: 74 mg/dL (ref 65–99)
Potassium: 4 mmol/L (ref 3.5–5.3)
Sodium: 141 mmol/L (ref 135–146)
Total Bilirubin: 0.3 mg/dL (ref 0.2–1.2)
Total Protein: 8 g/dL (ref 6.1–8.1)
eGFR: 102 mL/min/{1.73_m2} (ref >=60)

## 2021-02-16 LAB — CBC
HCT: 32 % — ABNORMAL LOW (ref 35.0–45.0)
Hemoglobin: 9.2 g/dL — ABNORMAL LOW (ref 11.7–15.5)
MCH: 18.3 pg — ABNORMAL LOW (ref 27.0–33.0)
MCHC: 28.8 g/dL — ABNORMAL LOW (ref 32.0–36.0)
MCV: 63.5 fL — ABNORMAL LOW (ref 80.0–100.0)
MPV: 9.5 fL (ref 7.5–12.5)
Platelets: 687 10*3/uL — ABNORMAL HIGH (ref 140–400)
RBC: 5.04 10*6/uL (ref 3.80–5.10)
RDW: 23.3 % — ABNORMAL HIGH (ref 11.0–15.0)
WBC: 10.1 10*3/uL (ref 3.8–10.8)

## 2021-02-16 LAB — VITAMIN D 25 HYDROXY (VIT D DEFICIENCY, FRACTURES): Vit D, 25-Hydroxy: 17 ng/mL — ABNORMAL LOW (ref 30–100)

## 2021-02-16 LAB — TSH: TSH: 0.82 mIU/L

## 2021-03-15 ENCOUNTER — Other Ambulatory Visit: Payer: Self-pay

## 2021-03-15 ENCOUNTER — Encounter: Payer: Self-pay | Admitting: Cardiovascular Disease

## 2021-03-15 ENCOUNTER — Ambulatory Visit: Payer: Medicaid Other | Admitting: Cardiovascular Disease

## 2021-03-15 VITALS — BP 148/88 | HR 89 | Ht 60.0 in | Wt 164.6 lb

## 2021-03-15 DIAGNOSIS — Z79899 Other long term (current) drug therapy: Secondary | ICD-10-CM

## 2021-03-15 DIAGNOSIS — I10 Essential (primary) hypertension: Secondary | ICD-10-CM | POA: Diagnosis not present

## 2021-03-15 DIAGNOSIS — I5022 Chronic systolic (congestive) heart failure: Secondary | ICD-10-CM | POA: Diagnosis not present

## 2021-03-15 MED ORDER — LOSARTAN POTASSIUM 100 MG PO TABS: 100.0000 mg | ORAL_TABLET | Freq: Every day | ORAL | 3 refills | Status: DC

## 2021-03-15 NOTE — Patient Instructions (Signed)
Medication Instructions:  Please increase your Losartan to 100 mg a day. Continue all other medications as listed.  *If you need a refill on your cardiac medications before your next appointment, please call your pharmacy*  Lab Work: Please have blood work in 3 weeks (BMP) And in 6 months (BMP) If you have labs (blood work) drawn today and your tests are completely normal, you will receive your results only by: [place] (if you have MyChart) OR A paper copy in the mail If you have any lab test that is abnormal or we need to change your treatment, we will call you to review the results.  Testing/Procedures: Your physician has requested that you have an echocardiogram in 6 months. Echocardiography is a painless test that uses sound waves to create images of your heart. It provides your doctor with information about the size and shape of your heart and how well your heart's chambers and valves are working. This procedure takes approximately one hour. There are no restrictions for this procedure.  Follow-Up: At [facility], you and your health needs are our priority.  As part of our continuing mission to provide you with exceptional heart care, we have created designated Provider Care Teams.  These Care Teams include your primary Cardiologist (physician) and Advanced Practice Providers (APPs -  Physician Assistants and Nurse Practitioners) who all work together to provide you with the care you need, when you need it.  We recommend signing up for the patient portal called "MyChart".  Sign up information is provided on this After Visit Summary.  MyChart is used to connect with patients for Virtual Visits (Telemedicine).  Patients are able to view lab/test results, encounter notes, upcoming appointments, etc.  Non-urgent messages can be sent to your provider as well.   To learn more about what you can do with MyChart, go to [email or web address].    Your next appointment:   6  month(s)  The format for your next appointment:   In Person  Provider:   Mertie Moores, MD Or any APP If MD is not listed, click here to update    :1}    Thank you for choosing [facility]!!

## 2021-03-15 NOTE — Progress Notes (Signed)
Cyst Cardiology Office Note:    Date:  03/15/2021   ID:  Kelly Guzman, DOB 26-Feb-1976, MRN [id number]  PCP:  Nolene Ebbs, MD   [facility] Providers Cardiologist:  Mertie Moores, MD     Referring MD: Nolene Ebbs, MD   Chief Complaint  Patient presents with   Congestive Heart Failure          Aug 14, 2020   Kelly Guzman is a 45 y.o. female with a hx of iron deficiency anemia, HTN She was admitted to the hospital [facility]) with severe anemia ( Hb = 3)  Tachycardia EF 35-40% RV volume overload  Mod - severe TR She was started on Entresto, coreg, spironolactone   Still having lots of vaginal spotting  Last Hb was 10.6 on April 16,   Could not afford entresto Is on Losartan 50 mg  Breathing is better.  Needs to avoid salt intake .  Is not taking her iron tabs. Thought they made her cramp more   Dec. 5, 2022: Kelly Guzman is seen for follow up for her CHF I met her several months ago at [facility].  She had significant heart failure related to profound anemia.  Her original LVEF was 35 to 40%.  She had moderate to severe tricuspid regurgitation.  We tried her on Entresto but she was not able to afford it.  We changed to losartan.  We had her on Losartan 100 mg a day ,  her primary MD reduced it to 50 but her BP has been running high . Will increase her losartan back  to 100 mg  a day   Past Medical History:  Diagnosis Date   Anemia    Heart failure ([place])    Hypertension     Past Surgical History:  Procedure Laterality Date   CESAREAN SECTION     gave birth to her daughter around 2002    Current Medications: Current Meds  Medication Sig   carvedilol (COREG) 25 MG tablet Take 1 tablet (25 mg total) by mouth 2 (two) times daily with a meal.   cetirizine (ZYRTEC) 10 MG tablet Take 10 mg by mouth daily.   dicyclomine (BENTYL) 10 MG capsule Take 10 mg by mouth 3 (three) times daily as needed.   losartan (COZAAR) 100 MG tablet Take 1 tablet (100  mg total) by mouth daily.   medroxyPROGESTERone (PROVERA) 10 MG tablet Take by mouth. Take 2 tabs three times a day x 1 week then go to 1 tab daily   norethindrone (AYGESTIN) 5 MG tablet Take 10 mg by mouth at bedtime.   spironolactone (ALDACTONE) 25 MG tablet Take 1 tablet (25 mg total) by mouth daily.   [DISCONTINUED] losartan (COZAAR) 50 MG tablet Take 50 mg by mouth daily.     Allergies:   Penicillins   Social History   Socioeconomic History   Marital status: Single    Spouse name: Not on file   Number of children: 1   Years of education: Not on file   Highest education level: Not on file  Occupational History   Not on file  Tobacco Use   Smoking status: Never   Smokeless tobacco: Never  Vaping Use   Vaping Use: Never used  Substance and Sexual Activity   Alcohol use: Never   Drug use: Never   Sexual activity: Not on file  Other Topics Concern   Not on file  Social History Narrative   Not on file  Social Determinants of Health   Financial Resource Strain: Not on file  Food Insecurity: Not on file  Transportation Needs: Not on file  Physical Activity: Not on file  Stress: Not on file  Social Connections: Not on file     Family History: The patient's family history includes Diabetes in her father and mother; Heart failure in her father and paternal grandfather; Hypertension in her mother.  ROS:   Please see the history of present illness.     All other systems reviewed and are negative.  EKGs/Labs/Other Studies Reviewed:    The following studies were reviewed today:   EKG:     Recent Labs: 07/14/2020: B Natriuretic Peptide 1,133.9 07/23/2020: Magnesium 1.8 02/15/2021: ALT 21; BUN 11; Creat 0.74; Hemoglobin 9.2; Platelets 687; Potassium 4.0; Sodium 141; TSH 0.82  Recent Lipid Panel    Component Value Date/Time   CHOL 142 02/15/2021 0329   TRIG 104 02/15/2021 0329   HDL 45 (L) 02/15/2021 0329   CHOLHDL 3.2 02/15/2021 0329   LDLCALC 78 02/15/2021 0329      Risk Assessment/Calculations:       Physical Exam:     Physical Exam: Blood pressure (!) 148/88, pulse 89, height 5' (1.524 m), weight 164 lb 9.6 oz (74.7 kg), SpO2 99 %.  GEN:  Well nourished, well developed in no acute distress HEENT: Normal NECK: No JVD; No carotid bruits LYMPHATICS: No lymphadenopathy CARDIAC: RRR , no murmurs, rubs, gallops RESPIRATORY:  Clear to auscultation without rales, wheezing or rhonchi  ABDOMEN: Soft, non-tender, non-distended MUSCULOSKELETAL:  No edema; No deformity  SKIN: Warm and dry NEUROLOGIC:  Alert and oriented x 3   ASSESSMENT:    1. Chronic systolic CHF (congestive heart failure) ([place])   2. Medication management   3. Essential hypertension     PLAN:      Acute on chronic combined systolic and diastolic congestive heart failure: She recovered presents for further evaluation of and follow-up of her congestive heart failure.  She was seen in the [place] [facility] about a month ago with severe heart failure related to anemia.  Her anemia was due to very heavy menstrual bleeding.  2.  Iron deficient anemia:           Medication Adjustments/Labs and Tests Ordered: Current medicines are reviewed at length with the patient today.  Concerns regarding medicines are outlined above.  Orders Placed This Encounter  Procedures   Basic metabolic panel   Basic metabolic panel   ECHOCARDIOGRAM COMPLETE    Meds ordered this encounter  Medications   losartan (COZAAR) 100 MG tablet    Sig: Take 1 tablet (100 mg total) by mouth daily.    Dispense:  90 tablet    Refill:  3     Patient Instructions  Medication Instructions:  Please increase your Losartan to 100 mg a day. Continue all other medications as listed.  *If you need a refill on your cardiac medications before your next appointment, please call your pharmacy*  Lab Work: Please have blood work in 3 weeks (BMP) And in 6 months (BMP) If you have labs (blood work)  drawn today and your tests are completely normal, you will receive your results only by: [place] (if you have MyChart) OR A paper copy in the mail If you have any lab test that is abnormal or we need to change your treatment, we will call you to review the results.  Testing/Procedures: Your physician has requested that you have an  echocardiogram in 6 months. Echocardiography is a painless test that uses sound waves to create images of your heart. It provides your doctor with information about the size and shape of your heart and how well your heart's chambers and valves are working. This procedure takes approximately one hour. There are no restrictions for this procedure.  Follow-Up: At [facility], you and your health needs are our priority.  As part of our continuing mission to provide you with exceptional heart care, we have created designated Provider Care Teams.  These Care Teams include your primary Cardiologist (physician) and Advanced Practice Providers (APPs -  Physician Assistants and Nurse Practitioners) who all work together to provide you with the care you need, when you need it.  We recommend signing up for the patient portal called "MyChart".  Sign up information is provided on this After Visit Summary.  MyChart is used to connect with patients for Virtual Visits (Telemedicine).  Patients are able to view lab/test results, encounter notes, upcoming appointments, etc.  Non-urgent messages can be sent to your provider as well.   To learn more about what you can do with MyChart, go to [email or web address].    Your next appointment:   6 month(s)  The format for your next appointment:   In Person  Provider:   Mertie Moores, MD Or any APP If MD is not listed, click here to update    :1}    Thank you for choosing [facility]!!     Signed, Mertie Moores, MD  03/15/2021 5:25 PM    [place]

## 2021-04-09 ENCOUNTER — Other Ambulatory Visit: Payer: Self-pay

## 2021-04-09 ENCOUNTER — Other Ambulatory Visit: Payer: Medicaid Other | Admitting: *Deleted

## 2021-04-09 DIAGNOSIS — Z79899 Other long term (current) drug therapy: Secondary | ICD-10-CM

## 2021-04-09 DIAGNOSIS — I5022 Chronic systolic (congestive) heart failure: Secondary | ICD-10-CM

## 2021-04-10 LAB — BASIC METABOLIC PANEL
BUN/Creatinine Ratio: 17 (ref 9–23)
BUN: 13 mg/dL (ref 6–24)
CO2: 18 mmol/L — ABNORMAL LOW (ref 20–29)
Calcium: 9.8 mg/dL (ref 8.7–10.2)
Chloride: 106 mmol/L (ref 96–106)
Creatinine, Ser: 0.77 mg/dL (ref 0.57–1.00)
Glucose: 96 mg/dL (ref 70–99)
Potassium: 4 mmol/L (ref 3.5–5.2)
Sodium: 141 mmol/L (ref 134–144)
eGFR: 97 mL/min/{1.73_m2} (ref >59)

## 2021-05-17 ENCOUNTER — Other Ambulatory Visit: Payer: Self-pay | Admitting: Internal Medicine

## 2021-05-17 LAB — CBC
HCT: 28.3 % — ABNORMAL LOW (ref 35.0–45.0)
Hemoglobin: 7.6 g/dL — ABNORMAL LOW (ref 11.7–15.5)
MCH: 17.2 pg — ABNORMAL LOW (ref 27.0–33.0)
MCHC: 26.9 g/dL — ABNORMAL LOW (ref 32.0–36.0)
MCV: 63.9 fL — ABNORMAL LOW (ref 80.0–100.0)
MPV: 10.2 fL (ref 7.5–12.5)
Platelets: 642 10*3/uL — ABNORMAL HIGH (ref 140–400)
RBC: 4.43 10*6/uL (ref 3.80–5.10)
RDW: 26.7 % — ABNORMAL HIGH (ref 11.0–15.0)
WBC: 10.7 10*3/uL (ref 3.8–10.8)

## 2021-05-17 LAB — EXTRA SPECIMEN

## 2021-05-19 ENCOUNTER — Inpatient Hospital Stay (HOSPITAL_BASED_OUTPATIENT_CLINIC_OR_DEPARTMENT_OTHER): Payer: No Typology Code available for payment source | Admitting: Physician Assistant

## 2021-05-19 ENCOUNTER — Other Ambulatory Visit: Payer: Self-pay | Admitting: *Deleted

## 2021-05-19 ENCOUNTER — Telehealth: Payer: Self-pay | Admitting: *Deleted

## 2021-05-19 ENCOUNTER — Other Ambulatory Visit: Payer: Self-pay

## 2021-05-19 ENCOUNTER — Inpatient Hospital Stay: Payer: No Typology Code available for payment source | Attending: Physician Assistant

## 2021-05-19 ENCOUNTER — Other Ambulatory Visit: Payer: Self-pay | Admitting: Physician Assistant

## 2021-05-19 VITALS — BP 198/105 | HR 81 | Temp 97.5°F | Resp 20 | Wt 165.3 lb

## 2021-05-19 DIAGNOSIS — I110 Hypertensive heart disease with heart failure: Secondary | ICD-10-CM | POA: Diagnosis not present

## 2021-05-19 DIAGNOSIS — E611 Iron deficiency: Secondary | ICD-10-CM

## 2021-05-19 DIAGNOSIS — D509 Iron deficiency anemia, unspecified: Secondary | ICD-10-CM | POA: Diagnosis not present

## 2021-05-19 DIAGNOSIS — D500 Iron deficiency anemia secondary to blood loss (chronic): Secondary | ICD-10-CM | POA: Diagnosis present

## 2021-05-19 LAB — CBC WITH DIFFERENTIAL (CANCER CENTER ONLY)
Abs Immature Granulocytes: 0.03 10*3/uL (ref 0.00–0.07)
Basophils Absolute: 0.1 10*3/uL (ref 0.0–0.1)
Basophils Relative: 1 %
Eosinophils Absolute: 0.2 10*3/uL (ref 0.0–0.5)
Eosinophils Relative: 1 %
HCT: 28 % — ABNORMAL LOW (ref 36.0–46.0)
Hemoglobin: 7.6 g/dL — ABNORMAL LOW (ref 12.0–15.0)
Immature Granulocytes: 0 %
Lymphocytes Relative: 20 %
Lymphs Abs: 2.2 10*3/uL (ref 0.7–4.0)
MCH: 17.6 pg — ABNORMAL LOW (ref 26.0–34.0)
MCHC: 27.1 g/dL — ABNORMAL LOW (ref 30.0–36.0)
MCV: 64.8 fL — ABNORMAL LOW (ref 80.0–100.0)
Monocytes Absolute: 0.7 10*3/uL (ref 0.1–1.0)
Monocytes Relative: 6 %
Neutro Abs: 7.9 10*3/uL — ABNORMAL HIGH (ref 1.7–7.7)
Neutrophils Relative %: 72 %
Platelet Count: 561 10*3/uL — ABNORMAL HIGH (ref 150–400)
RBC: 4.32 MIL/uL (ref 3.87–5.11)
RDW: 28.6 % — ABNORMAL HIGH (ref 11.5–15.5)
WBC Count: 11.1 10*3/uL — ABNORMAL HIGH (ref 4.0–10.5)
nRBC: 0.4 % — ABNORMAL HIGH (ref 0.0–0.2)

## 2021-05-19 LAB — SAMPLE TO BLOOD BANK

## 2021-05-19 LAB — PREPARE RBC (CROSSMATCH)

## 2021-05-19 NOTE — Progress Notes (Signed)
[place] [phone]   [phone]  PROGRSS NOTE  Patient Care Team: Nolene Ebbs, MD as PCP - General (Internal Medicine) Nahser, Wonda Cheng, MD as PCP - Cardiology (Cardiology)  Hematological/Oncological History # Iron Deficiency Anemia 2/2 to GYN Bleeding -04/26/2017: Hgb 4.8, Iron <10, ferritin 1, Iron sat 1%, TIBC 508 -07/10/2020: WBC 6.4, Hgb 3.0, MCV 55, Plt 643 -07/13/2020: establish care with Dr. Lorenso Courier  -07/13/2020-07/18/2020: admitted for acute anemia with Hgb of 3.5. received 3 units of PRBC and Hgb treneded up to 9.8.  --07/22/2020-07/23/2020: readmitted for acute anemia with Hgb of 7.3. Received 1 unit of PRBC.  --01/05/2021: presented to ED for abnormal uterine bleeding. Hgb was 7.4 so received 1 unit of PRBC --05/19/2021: Hgb 7.6, plan to give 2 units of PRBC on 05/21/2021  CHIEF COMPLAINTS/PURPOSE OF CONSULTATION:  " Iron Deficiency Anemia  "   HISTORY OF PRESENTING ILLNESS:  Kelly Guzman 46 y.o. female returns for a follow up for iron deficiency anemia. Patient was last seen on 07/13/2020 by Dr. Lorenso Courier. In the interim, patient has been admitted several times and received several units of PRBC due to acute anemia.   On review of the previous records Kelly Guzman reports chronic fatigue but she is able to complete her daily activities on her own. She denies any dietary restrictions or recent weight changes. She adds that that she continues to have heavy menstrual bleeding but is scheduled for myomectomy next week on 05/26/2021. She has diffuse join pain but this does not affect her ambulation. She denies nausea, vomiting, abdominal pain, diarrhea or constipation. She denies easy bruising or active bleeding except for her menstrual bleeding. Patient denies fevers, chills, night sweats, chest pain or cough. She has no other complaints. A full 10 point ROS is listed below.  MEDICAL HISTORY:  Past Medical History:  Diagnosis Date   Anemia     Heart failure ([place])    Hypertension     SURGICAL HISTORY: Past Surgical History:  Procedure Laterality Date   CESAREAN SECTION     gave birth to her daughter around 2002    SOCIAL HISTORY: Social History   Socioeconomic History   Marital status: Single    Spouse name: Not on file   Number of children: 1   Years of education: Not on file   Highest education level: Not on file  Occupational History   Not on file  Tobacco Use   Smoking status: Never   Smokeless tobacco: Never  Vaping Use   Vaping Use: Never used  Substance and Sexual Activity   Alcohol use: Never   Drug use: Never   Sexual activity: Not on file  Other Topics Concern   Not on file  Social History Narrative   Not on file   Social Determinants of Health   Financial Resource Strain: Not on file  Food Insecurity: Not on file  Transportation Needs: Not on file  Physical Activity: Not on file  Stress: Not on file  Social Connections: Not on file  Intimate Partner Violence: Not on file    FAMILY HISTORY: Family History  Problem Relation Age of Onset   Diabetes Mother    Hypertension Mother    Diabetes Father    Heart failure Father    Heart failure Paternal Grandfather     ALLERGIES:  is allergic to penicillins and tape.  MEDICATIONS:  Current Outpatient Medications  Medication Sig Dispense Refill   ferrous gluconate (FERGON) 240 (27 FE) MG  tablet Take 240 mg by mouth daily.     carvedilol (COREG) 25 MG tablet Take 1 tablet (25 mg total) by mouth 2 (two) times daily with a meal. 60 tablet 11   cetirizine (ZYRTEC) 10 MG tablet Take 10 mg by mouth daily. (Patient not taking: Reported on 05/19/2021)     dicyclomine (BENTYL) 10 MG capsule Take 10 mg by mouth 3 (three) times daily as needed.     losartan (COZAAR) 100 MG tablet Take 1 tablet (100 mg total) by mouth daily. 90 tablet 3   medroxyPROGESTERone (PROVERA) 10 MG tablet Take by mouth. Take 2 tabs three times a day x 1 week then go to 1 tab  daily     norethindrone (AYGESTIN) 5 MG tablet Take 10 mg by mouth at bedtime. (Patient not taking: Reported on 05/19/2021)     spironolactone (ALDACTONE) 25 MG tablet Take 1 tablet (25 mg total) by mouth daily. 30 tablet 11   No current facility-administered medications for this visit.    REVIEW OF SYSTEMS:   Constitutional: ( - ) fevers, ( - )  chills , ( - ) night sweats Eyes: ( - ) blurriness of vision, ( - ) double vision, ( - ) watery eyes Ears, nose, mouth, throat, and face: ( - ) mucositis, ( - ) sore throat Respiratory: ( - ) cough, ( - ) dyspnea, ( - ) wheezes Cardiovascular: ( - ) palpitation, ( - ) chest discomfort, ( - ) lower extremity swelling Gastrointestinal:  ( - ) nausea, ( - ) heartburn, ( - ) change in bowel habits Skin: ( - ) abnormal skin rashes Lymphatics: ( - ) new lymphadenopathy, ( - ) easy bruising Neurological: ( - ) numbness, ( - ) tingling, ( - ) new weaknesses Behavioral/Psych: ( - ) mood change, ( - ) new changes  All other systems were reviewed with the patient and are negative.  PHYSICAL EXAMINATION:  Vitals:   05/19/21 1056  BP: (!) 198/105  Pulse: 81  Resp: 20  Temp: (!) 97.5 F (36.4 C)  SpO2: 100%   Filed Weights   05/19/21 1056  Weight: 165 lb 4.8 oz (75 kg)    GENERAL: well appearing pale African-American female in NAD  SKIN: skin color, texture, turgor are normal, no rashes or significant lesions EYES: conjunctiva are pale and non-injected, sclera clear LUNGS: clear to auscultation and percussion with normal breathing effort HEART: regular rate & rhythm and no murmurs and no lower extremity edema Musculoskeletal: no cyanosis of digits and no clubbing  PSYCH: alert & oriented x 3, fluent speech NEURO: no focal motor/sensory deficits  LABORATORY DATA:  I have reviewed the data as listed CBC Latest Ref Rng & Units 05/19/2021 05/17/2021 02/15/2021  WBC 4.0 - 10.5 K/uL 11.1(H) 10.7 10.1  Hemoglobin 12.0 - 15.0 g/dL 7.6(L) 7.6(L) 9.2(L)   Hematocrit 36.0 - 46.0 % 28.0(L) 28.3(L) 32.0(L)  Platelets 150 - 400 K/uL 561(H) 642(H) 687(H)    CMP Latest Ref Rng & Units 04/09/2021 02/15/2021 01/04/2021  Glucose 70 - 99 mg/dL 96 74 84  BUN 6 - 24 mg/dL 13 11 8   Creatinine 0.57 - 1.00 mg/dL 0.77 0.74 0.61  Sodium 134 - 144 mmol/L 141 141 144  Potassium 3.5 - 5.2 mmol/L 4.0 4.0 3.6  Chloride 96 - 106 mmol/L 106 109 114(H)  CO2 20 - 29 mmol/L 18(L) 20 24  Calcium 8.7 - 10.2 mg/dL 9.8 9.9 9.9  Total Protein 6.1 - 8.1 g/dL -  8.0 8.3(H)  Total Bilirubin 0.2 - 1.2 mg/dL - 0.3 0.4  Alkaline Phos 38 - 126 U/L - - 40  AST 10 - 35 U/L - 20 24  ALT 6 - 29 U/L - 21 23    RADIOGRAPHIC STUDIES: I have personally reviewed the radiological images as listed and agreed with the findings in the report. No results found.  ASSESSMENT & PLAN Kelly Guzman returns for a follow up for iron deficiency anemia.  # Iron Deficiency Anemia 2/2 to GYN Bleeding --Reported black colored stools in April 2022.Negative hemoccult test and she denies any hematochezia or melena at this time.  --Currently taking ferrous gluconate 240 mg once once daily. Advised to continue to take with a source of vitamin C (orange juice) --Labs today show Hemoglobin is 7.6, MCV 64.8, Plt 561. --Plan to administer 2 units of PRBC on Friday, 05/21/2021 at [facility].  --Patient is scheduled for myomectomy on 05/26/2021. Need target hemoglobin to be 10-11.  --RTC in 4 weeks with repeat labs to determine if additional IV iron infusion is needed.   No orders of the defined types were placed in this encounter.   All questions were answered. The patient knows to call the clinic with any problems, questions or concerns.  I have spent a total of 30 minutes minutes of face-to-face and non-face-to-face time, preparing to see the patient, performing a medically appropriate examination, counseling and educating the patient, ordering medications/tests,documenting clinical  information in the electronic health record, and care coordination.   Dede Query PA-C Dept of Hematology and [place] at [facility] Phone: [phone]  05/19/2021 5:00 PM

## 2021-05-19 NOTE — Telephone Encounter (Signed)
Received call from receptionist in lobby stating pt is here for blood transfusion. Pt states her PCP told her come her this morning for transfusion. Spoke with pt in the lobby. Advised that she needs to be seen by MD/PA and have labs drawn including sample to blood bank before getting transfusions. Advised that we do not take transfusion orders from providers outside the cancer center. Advised that we have not heard from her PCP.Also advised that the infusion room is full today and she will likely not be able to get transfusion today. Pt is scheduled for fibroid removal surgery on 05/26/21. She has uterine bleeding due to fibroids.  She states her HGB was 7.6 yesterday.  High priority scheduling message sent for labs and MD appt.

## 2021-05-20 ENCOUNTER — Telehealth: Payer: Self-pay | Admitting: Physician Assistant

## 2021-05-20 NOTE — Telephone Encounter (Signed)
Called pt to schedule per 2/8 los, pt stated they did not want to schedule anymore appts at this time. I told pt to call back if they change their mind

## 2021-05-21 ENCOUNTER — Inpatient Hospital Stay: Payer: No Typology Code available for payment source

## 2021-05-21 ENCOUNTER — Other Ambulatory Visit: Payer: Self-pay | Admitting: Physician Assistant

## 2021-05-21 ENCOUNTER — Other Ambulatory Visit: Payer: Self-pay

## 2021-05-21 DIAGNOSIS — D500 Iron deficiency anemia secondary to blood loss (chronic): Secondary | ICD-10-CM

## 2021-05-21 MED ORDER — DIPHENHYDRAMINE HCL 25 MG PO CAPS: 25.0000 mg | ORAL_CAPSULE | Freq: Once | ORAL | Status: DC

## 2021-05-21 MED ORDER — ACETAMINOPHEN 325 MG PO TABS: 650.0000 mg | ORAL_TABLET | Freq: Once | ORAL | Status: DC

## 2021-05-21 MED ORDER — SODIUM CHLORIDE 0.9% IV SOLUTION
250.0000 mL | Freq: Once | INTRAVENOUS | Status: AC
  Administered 2021-05-21: 250 mL via INTRAVENOUS

## 2021-05-21 NOTE — Progress Notes (Signed)
Patient presents today for 2 Units PRBC, at time of administration, patient did not have her blue blood bank bracelet. This Probation officer explained to patient that her blood transfusion can not be administered without the bracelet. This nurse further enquired from the patient if the bracelet was discarded but patient responded stating "I never threw it away, I took it off and set it on my dresser". This Probation officer further enquired if there was someone who could bring the bracelet to patient but she responded "no".   Patient was rescheduled to return to [facility] tomorrow Saturday 05/22/21 at 0900 for her transfusion. Patient required transportation services for today, this was also arranged for he tomorrow with 0815 pick up time. She was reminded to have the blue blood bank bracelet on when she arrives to be transfused. Patient was agreeable with this and verbalized understanding.  [place] at [facility] blood bank was informed of the situation and the blood sent back to the blood bank.

## 2021-05-22 ENCOUNTER — Inpatient Hospital Stay: Payer: No Typology Code available for payment source

## 2021-05-22 ENCOUNTER — Other Ambulatory Visit: Payer: Self-pay

## 2021-05-22 VITALS — BP 178/85 | HR 76 | Temp 98.7°F | Resp 16

## 2021-05-22 DIAGNOSIS — D500 Iron deficiency anemia secondary to blood loss (chronic): Secondary | ICD-10-CM

## 2021-05-22 MED ORDER — SODIUM CHLORIDE 0.9% IV SOLUTION
250.0000 mL | Freq: Once | INTRAVENOUS | Status: AC
  Administered 2021-05-22: 250 mL via INTRAVENOUS

## 2021-05-22 MED ORDER — ACETAMINOPHEN 325 MG PO TABS
650.0000 mg | ORAL_TABLET | Freq: Once | ORAL | Status: DC
  Filled 2021-05-22 (×2): qty 2

## 2021-05-22 MED ORDER — DIPHENHYDRAMINE HCL 25 MG PO CAPS
25.0000 mg | ORAL_CAPSULE | Freq: Once | ORAL | Status: DC
  Filled 2021-05-22 (×2): qty 1

## 2021-05-24 LAB — BPAM RBC
Blood Product Expiration Date: 202303012359
Blood Product Expiration Date: 202303012359
ISSUE DATE / TIME: 202302110840
ISSUE DATE / TIME: 202302110840
Unit Type and Rh: 6200
Unit Type and Rh: 6200

## 2021-05-24 LAB — TYPE AND SCREEN
ABO/RH(D): A POS
Antibody Screen: NEGATIVE
Unit division: 0
Unit division: 0

## 2021-06-14 ENCOUNTER — Ambulatory Visit: Payer: Medicaid Other | Admitting: Hematology and Oncology

## 2021-06-14 ENCOUNTER — Other Ambulatory Visit: Payer: Medicaid Other

## 2021-09-09 ENCOUNTER — Other Ambulatory Visit: Payer: Medicaid Other | Admitting: *Deleted

## 2021-09-09 ENCOUNTER — Ambulatory Visit (HOSPITAL_COMMUNITY): Payer: 59 | Attending: Cardiology

## 2021-09-09 ENCOUNTER — Other Ambulatory Visit: Payer: Medicaid Other

## 2021-09-09 DIAGNOSIS — Z79899 Other long term (current) drug therapy: Secondary | ICD-10-CM

## 2021-09-09 DIAGNOSIS — I5022 Chronic systolic (congestive) heart failure: Secondary | ICD-10-CM | POA: Insufficient documentation

## 2021-09-09 LAB — ECHOCARDIOGRAM COMPLETE
Area-P 1/2: 4.44 cm2
S' Lateral: 2.4 cm

## 2021-09-10 LAB — BASIC METABOLIC PANEL
BUN/Creatinine Ratio: 10 (ref 9–23)
BUN: 6 mg/dL (ref 6–24)
CO2: 22 mmol/L (ref 20–29)
Calcium: 9.5 mg/dL (ref 8.7–10.2)
Chloride: 103 mmol/L (ref 96–106)
Creatinine, Ser: 0.63 mg/dL (ref 0.57–1.00)
Glucose: 73 mg/dL (ref 70–99)
Potassium: 4.3 mmol/L (ref 3.5–5.2)
Sodium: 141 mmol/L (ref 134–144)
eGFR: 111 mL/min/{1.73_m2} (ref >59)

## 2021-09-14 ENCOUNTER — Ambulatory Visit (INDEPENDENT_AMBULATORY_CARE_PROVIDER_SITE_OTHER): Payer: 59 | Admitting: Cardiovascular Disease

## 2021-09-14 ENCOUNTER — Encounter: Payer: Self-pay | Admitting: Cardiovascular Disease

## 2021-09-14 ENCOUNTER — Other Ambulatory Visit: Payer: Medicaid Other

## 2021-09-14 VITALS — BP 162/90 | HR 74 | Ht 63.0 in | Wt 160.4 lb

## 2021-09-14 DIAGNOSIS — I5042 Chronic combined systolic (congestive) and diastolic (congestive) heart failure: Secondary | ICD-10-CM

## 2021-09-14 DIAGNOSIS — I10 Essential (primary) hypertension: Secondary | ICD-10-CM

## 2021-09-14 MED ORDER — SPIRONOLACTONE 25 MG PO TABS: 25.0000 mg | ORAL_TABLET | Freq: Every day | ORAL | 3 refills | Status: DC

## 2021-09-14 NOTE — Patient Instructions (Signed)
Medication Instructions:  REFILL Spironolactone *If you need a refill on your cardiac medications before your next appointment, please call your pharmacy*   Lab Work: NONE If you have labs (blood work) drawn today and your tests are completely normal, you will receive your results only by: [place] (if you have MyChart) OR A paper copy in the mail If you have any lab test that is abnormal or we need to change your treatment, we will call you to review the results.   Testing/Procedures: NONE   Follow-Up: At [facility], you and your health needs are our priority.  As part of our continuing mission to provide you with exceptional heart care, we have created designated Provider Care Teams.  These Care Teams include your primary Cardiologist (physician) and Advanced Practice Providers (APPs -  Physician Assistants and Nurse Practitioners) who all work together to provide you with the care you need, when you need it.  We recommend signing up for the patient portal called "MyChart".  Sign up information is provided on this After Visit Summary.  MyChart is used to connect with patients for Virtual Visits (Telemedicine).  Patients are able to view lab/test results, encounter notes, upcoming appointments, etc.  Non-urgent messages can be sent to your provider as well.   To learn more about what you can do with MyChart, go to [email or web address].    Your next appointment:   3 month(s)  The format for your next appointment:   In Person  Provider:   Robbie Lis, [place], [place]

## 2021-09-14 NOTE — Progress Notes (Signed)
Cyst Cardiology Office Note:    Date:  09/16/2021   ID:  Kelly Guzman, DOB [birth year], MRN [id number]  PCP:  Nolene Ebbs, MD   [facility] Providers Cardiologist:  Mertie Moores, MD     Referring MD: Nolene Ebbs, MD   Chief Complaint  Patient presents with   Congestive Heart Failure     Aug 14, 2020   Kelly Guzman is a 46 y.o. female with a hx of iron deficiency anemia, HTN She was admitted to the hospital [facility]) with severe anemia ( Hb = 3)  Tachycardia EF 35-40% RV volume overload  Mod - severe TR She was started on Entresto, coreg, spironolactone   Still having lots of vaginal spotting  Last Hb was 10.6 on April 16,   Could not afford entresto Is on Losartan 50 mg  Breathing is better.  Needs to avoid salt intake .  Is not taking her iron tabs. Thought they made her cramp more   Dec. 5, 2022: Kelly Guzman is seen for follow up for her CHF I met her several months ago at [facility].  She had significant heart failure related to profound anemia.  Her original LVEF was 35 to 40%.  She had moderate to severe tricuspid regurgitation.  We tried her on Entresto but she was not able to afford it.  We changed to losartan.  We had her on Losartan 100 mg a day ,  her primary MD reduced it to 50 but her BP has been running high . Will increase her losartan back  to 100 mg  a day   September 14, 2021: She became seen today for follow-up visit.  She has a history of severe anemia secondary to menstrual bleeding.  She has history of congestive heart failure and hypertension. She has run out of her spironolactone last month.  Echocardiogram from September 09, 2021 shows normal left ventricular systolic function.  She has grade 1 diastolic dysfunction.  She has mild mitral regurgitation.  Hb is up to 11    Past Medical History:  Diagnosis Date   Anemia    Heart failure ([place])    Hypertension     Past Surgical History:  Procedure Laterality Date   CESAREAN  SECTION     gave birth to her daughter around 2002    Current Medications: Current Meds  Medication Sig   carvedilol (COREG) 25 MG tablet Take 1 tablet (25 mg total) by mouth 2 (two) times daily with a meal.   losartan (COZAAR) 100 MG tablet Take 1 tablet (100 mg total) by mouth daily.   Multiple Vitamin (MULTIVITAMIN) capsule Take 1 capsule by mouth daily.     Allergies:   Penicillins and Tape   Social History   Socioeconomic History   Marital status: Single    Spouse name: Not on file   Number of children: 1   Years of education: Not on file   Highest education level: Not on file  Occupational History   Not on file  Tobacco Use   Smoking status: Never   Smokeless tobacco: Never  Vaping Use   Vaping Use: Never used  Substance and Sexual Activity   Alcohol use: Never   Drug use: Never   Sexual activity: Not on file  Other Topics Concern   Not on file  Social History Narrative   Not on file   Social Determinants of Health   Financial Resource Strain: Not on file  Food Insecurity: Not on  file  Transportation Needs: Not on file  Physical Activity: Not on file  Stress: Not on file  Social Connections: Not on file     Family History: The patient's family history includes Diabetes in her father and mother; Heart failure in her father and paternal grandfather; Hypertension in her mother.  ROS:   Please see the history of present illness.     All other systems reviewed and are negative.  EKGs/Labs/Other Studies Reviewed:    The following studies were reviewed today:   EKG:     Recent Labs: 02/15/2021: ALT 21; TSH 0.82 05/19/2021: Hemoglobin 7.6; Platelet Count 561 09/09/2021: BUN 6; Creatinine, Ser 0.63; Potassium 4.3; Sodium 141  Recent Lipid Panel    Component Value Date/Time   CHOL 142 02/15/2021 0329   TRIG 104 02/15/2021 0329   HDL 45 (L) 02/15/2021 0329   CHOLHDL 3.2 02/15/2021 0329   LDLCALC 78 02/15/2021 0329     Risk Assessment/Calculations:        Physical Exam:    Physical Exam: Blood pressure (!) 162/90, pulse 74, height 5' 3"  (1.6 m), weight 160 lb 6.4 oz (72.8 kg), SpO2 98 %.  GEN:  Well nourished, well developed in no acute distress HEENT: Normal NECK: No JVD; No carotid bruits LYMPHATICS: No lymphadenopathy CARDIAC: RRR   RESPIRATORY:  Clear to auscultation without rales, wheezing or rhonchi  ABDOMEN: Soft, non-tender, non-distended MUSCULOSKELETAL:  No edema; No deformity  SKIN: Warm and dry NEUROLOGIC:  Alert and oriented x 3   ASSESSMENT:    1. Chronic combined systolic and diastolic heart failure ([place])   2. Primary hypertension      PLAN:      Acute on chronic combined systolic and diastolic congestive heart failure:    EF has normalized now.   She has not been taking her spironolactone as prescribed.  I encouraged her to take her spironolactone every day.  I encouraged her to work on diet, exercise, weight loss.  2.  Iron deficient anemia: Her bleeding seems to have subsided.  Her hemoglobin is in the 11 range now.  It was as low as 3.0 on July 10, 2020.  3.  Hypertension: Blood pressure is elevated today but she admits to not taking her spironolactone.  We will restart her spironolactone and have her see an APP in 3 months.         Medication Adjustments/Labs and Tests Ordered: Current medicines are reviewed at length with the patient today.  Concerns regarding medicines are outlined above.  No orders of the defined types were placed in this encounter.   Meds ordered this encounter  Medications   spironolactone (ALDACTONE) 25 MG tablet    Sig: Take 1 tablet (25 mg total) by mouth daily.    Dispense:  90 tablet    Refill:  3      Patient Instructions  Medication Instructions:  REFILL Spironolactone *If you need a refill on your cardiac medications before your next appointment, please call your pharmacy*   Lab Work: NONE If you have labs (blood work) drawn today and your tests are  completely normal, you will receive your results only by: [place] (if you have MyChart) OR A paper copy in the mail If you have any lab test that is abnormal or we need to change your treatment, we will call you to review the results.   Testing/Procedures: NONE   Follow-Up: At [facility], you and your health needs are our priority.  As  part of our continuing mission to provide you with exceptional heart care, we have created designated Provider Care Teams.  These Care Teams include your primary Cardiologist (physician) and Advanced Practice Providers (APPs -  Physician Assistants and Nurse Practitioners) who all work together to provide you with the care you need, when you need it.  We recommend signing up for the patient portal called "MyChart".  Sign up information is provided on this After Visit Summary.  MyChart is used to connect with patients for Virtual Visits (Telemedicine).  Patients are able to view lab/test results, encounter notes, upcoming appointments, etc.  Non-urgent messages can be sent to your provider as well.   To learn more about what you can do with MyChart, go to [email or web address].    Your next appointment:   3 month(s)  The format for your next appointment:   In Person  Provider:   Robbie Lis, Christian Mate   Important Information About Sugar         Signed, Mertie Moores, MD  09/16/2021 6:39 PM    [place]

## 2021-11-23 ENCOUNTER — Other Ambulatory Visit: Payer: Self-pay | Admitting: Cardiovascular Disease

## 2021-11-23 DIAGNOSIS — I10 Essential (primary) hypertension: Secondary | ICD-10-CM

## 2021-12-16 ENCOUNTER — Other Ambulatory Visit: Payer: Self-pay | Admitting: Cardiovascular Disease

## 2021-12-17 ENCOUNTER — Other Ambulatory Visit: Payer: Self-pay

## 2021-12-17 MED ORDER — CARVEDILOL 25 MG PO TABS: 25.0000 mg | ORAL_TABLET | Freq: Two times a day (BID) | ORAL | 9 refills | Status: DC

## 2021-12-17 NOTE — Telephone Encounter (Signed)
Pt's medication was sent to pt's pharmacy as requested. Confirmation received.  °

## 2021-12-21 ENCOUNTER — Ambulatory Visit: Payer: No Typology Code available for payment source | Attending: Physician Assistant | Admitting: Physician Assistant

## 2021-12-21 NOTE — Progress Notes (Deleted)
Cardiology Office Note:    Date:  12/21/2021   ID:  Kelly Guzman, DOB Jun 23, 1975, MRN [id number]  PCP:  Nolene Ebbs, MD  [facility] Providers Cardiologist:  Mertie Moores, MD { Click to update primary MD,subspecialty MD or APP then REFRESH:1}  *** Referring MD: Nolene Ebbs, MD   Chief Complaint:  No chief complaint on file. {Click here for Visit Info    :1}   Patient Profile: (HFrEF) heart failure with reduced ejection fraction >> improved to normal EF Entresto cost prohibitive >> Losartan  Echocardiogram 4/22: EF 35-40 Echo 10/2020: EF 50-55 Echocardiogram 09/2021: EF normal  Moderate to severe TR Echo 07/2020 Trivial on echo in 7/22 Trivial on echo in 6/23 Mild mitral regurgitation Iron deficiency anemia Admitted in 2022 with hemoglobin of 3, tachycardia, EF 35-40 Menorrhagia  Hypertension  Prior CV Studies: ECHO COMPLETE WO IMAGING ENHANCING AGENT 09/09/2021 IMPRESSIONS 1. Left ventricular ejection fraction, by estimation, is 60 to 65%. The left ventricle has normal function. The left ventricle has no regional wall motion abnormalities. Left ventricular diastolic parameters are consistent with Grade I diastolic dysfunction (impaired relaxation). 2. Right ventricular systolic function is normal. The right ventricular size is normal. There is normal pulmonary artery systolic pressure. 3. Left atrial size was mildly dilated. 4. The mitral valve is normal in structure. Mild mitral valve regurgitation. No evidence of mitral stenosis. 5. The aortic valve is normal in structure. Aortic valve regurgitation is not visualized. No aortic stenosis is present. 6. The inferior vena cava is normal in size with greater than 50% respiratory variability, suggesting right atrial pressure of 3 mmHg. Comparison(s): The left ventricular function has improved. estimated right ventricular systolic pressure is 92.4 mmHg. Tricuspid Valve: The tricuspid valve is normal in  structure. Tricuspid valve regurgitation is trivial. No evidence of tricuspid stenosis.    ***  History of Present Illness:   Kelly Guzman is a 46 y.o. female with the above problem list.  She was last seen by Dr. Acie Fredrickson in June 2023.  She returns for follow-up.  ***        Past Medical History:  Diagnosis Date   Anemia    Heart failure ([place])    Hypertension    Current Medications: No outpatient medications have been marked as taking for the 12/21/21 encounter (Appointment) with Richardson Dopp T, PA-C.    Allergies:   Penicillins and Tape   Social History   Tobacco Use   Smoking status: Never   Smokeless tobacco: Never  Vaping Use   Vaping Use: Never used  Substance Use Topics   Alcohol use: Never   Drug use: Never    Family Hx: The patient's family history includes Diabetes in her father and mother; Heart failure in her father and paternal grandfather; Hypertension in her mother.  ROS   EKGs/Labs/Other Test Reviewed:    EKG:  EKG is *** ordered today.  The ekg ordered today demonstrates ***  Recent Labs: 02/15/2021: ALT 21; TSH 0.82 05/19/2021: Hemoglobin 7.6; Platelet Count 561 09/09/2021: BUN 6; Creatinine, Ser 0.63; Potassium 4.3; Sodium 141   Recent Lipid Panel Recent Labs    02/15/21 0329  CHOL 142  TRIG 104  HDL 45*  LDLCALC 78     Risk Assessment/Calculations/Metrics:   {Does this patient have ATRIAL FIBRILLATION?:[phone]}     No BP recorded.  {Refresh Note OR Click here to enter BP  :1}***    Physical Exam:    VS:  There were no vitals taken  for this visit.    Wt Readings from Last 3 Encounters:  09/14/21 160 lb 6.4 oz (72.8 kg)  05/19/21 165 lb 4.8 oz (75 kg)  03/15/21 164 lb 9.6 oz (74.7 kg)    Physical Exam ***     ASSESSMENT & PLAN:   No problem-specific Assessment & Plan notes found for this encounter.        {Are you ordering a CV Procedure (e.g. stress test, cath, DCCV, TEE, etc)?   Press F2        :[id number]}   Dispo:  No  follow-ups on file.   Medication Adjustments/Labs and Tests Ordered: Current medicines are reviewed at length with the patient today.  Concerns regarding medicines are outlined above.  Tests Ordered: No orders of the defined types were placed in this encounter.  Medication Changes: No orders of the defined types were placed in this encounter.  Signed, Richardson Dopp, PA-C  12/21/2021 7:50 AM    [facility] [place], [place], [place]  [address] Phone: [phone]; Fax: [phone]

## 2022-01-07 ENCOUNTER — Other Ambulatory Visit: Payer: Self-pay | Admitting: Internal Medicine

## 2022-01-08 LAB — COMPLETE METABOLIC PANEL WITH GFR
AG Ratio: 1.4 (calc) (ref 1.0–2.5)
ALT: 15 U/L (ref 6–29)
AST: 17 U/L (ref 10–35)
Albumin: 4.6 g/dL (ref 3.6–5.1)
Alkaline phosphatase (APISO): 57 U/L (ref 31–125)
BUN: 8 mg/dL (ref 7–25)
CO2: 21 mmol/L (ref 20–32)
Calcium: 9.4 mg/dL (ref 8.6–10.2)
Chloride: 107 mmol/L (ref 98–110)
Creat: 0.7 mg/dL (ref 0.50–0.99)
Globulin: 3.2 g/dL (calc) (ref 1.9–3.7)
Glucose, Bld: 91 mg/dL (ref 65–99)
Potassium: 4.2 mmol/L (ref 3.5–5.3)
Sodium: 139 mmol/L (ref 135–146)
Total Bilirubin: 0.4 mg/dL (ref 0.2–1.2)
Total Protein: 7.8 g/dL (ref 6.1–8.1)
eGFR: 108 mL/min/{1.73_m2} (ref >=60)

## 2022-01-08 LAB — LIPID PANEL
Cholesterol: 151 mg/dL (ref <200)
HDL: 51 mg/dL (ref >=50)
LDL Cholesterol (Calc): 81 mg/dL (calc)
Non-HDL Cholesterol (Calc): 100 mg/dL (calc) (ref <130)
Total CHOL/HDL Ratio: 3 (calc) (ref <5.0)
Triglycerides: 95 mg/dL (ref <150)

## 2022-01-08 LAB — CBC
HCT: 26.7 % — ABNORMAL LOW (ref 35.0–45.0)
Hemoglobin: 7 g/dL — ABNORMAL LOW (ref 11.7–15.5)
MCH: 16.1 pg — ABNORMAL LOW (ref 27.0–33.0)
MCHC: 26.2 g/dL — ABNORMAL LOW (ref 32.0–36.0)
MCV: 61.4 fL — ABNORMAL LOW (ref 80.0–100.0)
MPV: 9.4 fL (ref 7.5–12.5)
Platelets: 659 10*3/uL — ABNORMAL HIGH (ref 140–400)
RBC: 4.35 10*6/uL (ref 3.80–5.10)
RDW: 19.8 % — ABNORMAL HIGH (ref 11.0–15.0)
WBC: 8.9 10*3/uL (ref 3.8–10.8)

## 2022-01-08 LAB — VITAMIN D 25 HYDROXY (VIT D DEFICIENCY, FRACTURES): Vit D, 25-Hydroxy: 12 ng/mL — ABNORMAL LOW (ref 30–100)

## 2022-01-08 LAB — TSH: TSH: 1.25 mIU/L

## 2022-01-10 ENCOUNTER — Other Ambulatory Visit: Payer: Self-pay | Admitting: Internal Medicine

## 2022-01-10 DIAGNOSIS — Z1231 Encounter for screening mammogram for malignant neoplasm of breast: Secondary | ICD-10-CM

## 2022-01-20 ENCOUNTER — Ambulatory Visit
Admission: RE | Admit: 2022-01-20 | Discharge: 2022-01-20 | Disposition: A | Discharge status: Home / Self Care | Payer: Medicaid Other | Source: Ambulatory Visit | Attending: Internal Medicine | Admitting: Internal Medicine

## 2022-01-20 DIAGNOSIS — Z1231 Encounter for screening mammogram for malignant neoplasm of breast: Secondary | ICD-10-CM

## 2022-01-21 ENCOUNTER — Other Ambulatory Visit: Payer: Self-pay | Admitting: Internal Medicine

## 2022-01-21 DIAGNOSIS — R928 Other abnormal and inconclusive findings on diagnostic imaging of breast: Secondary | ICD-10-CM

## 2022-01-31 ENCOUNTER — Other Ambulatory Visit: Payer: No Typology Code available for payment source

## 2022-02-07 ENCOUNTER — Ambulatory Visit
Admission: RE | Admit: 2022-02-07 | Discharge: 2022-02-07 | Disposition: A | Discharge status: Home / Self Care | Payer: No Typology Code available for payment source | Source: Ambulatory Visit | Attending: Internal Medicine | Admitting: Internal Medicine

## 2022-02-07 DIAGNOSIS — R928 Other abnormal and inconclusive findings on diagnostic imaging of breast: Secondary | ICD-10-CM

## 2022-03-21 ENCOUNTER — Other Ambulatory Visit: Payer: Self-pay | Admitting: Internal Medicine

## 2022-03-22 LAB — SARS-COV-2 RNA,(COVID-19) QUALITATIVE NAAT: SARS CoV2 RNA: NOT DETECTED

## 2022-04-20 IMAGING — CT CT ABD-PELV W/ CM
2 of 5 series · 15 of 46 positions shown, 17 images · IV contrast (OMNIPAQUE)
Comparison: None.

CLINICAL DATA: Nausea and diffuse abdominal pain.

EXAM:
CT ABDOMEN AND PELVIS WITH CONTRAST
TECHNIQUE: Multidetector CT imaging of the abdomen and pelvis was performed
using the standard protocol following bolus administration of
intravenous contrast.
CONTRAST:  100mL OMNIPAQUE IOHEXOL 300 MG/ML  SOLN

[Series 2: axial st · axial · 0.65mm/px · z∈[-194,+131]mm · 12 of 75 slices shown, 14 images]
[im 5/75  soft-tissue]
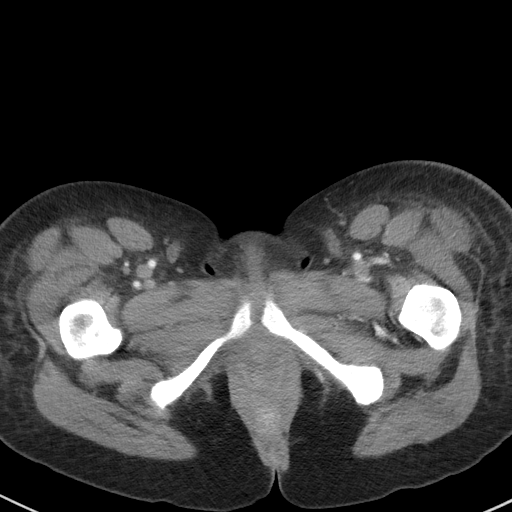
[im 5/75  bone]
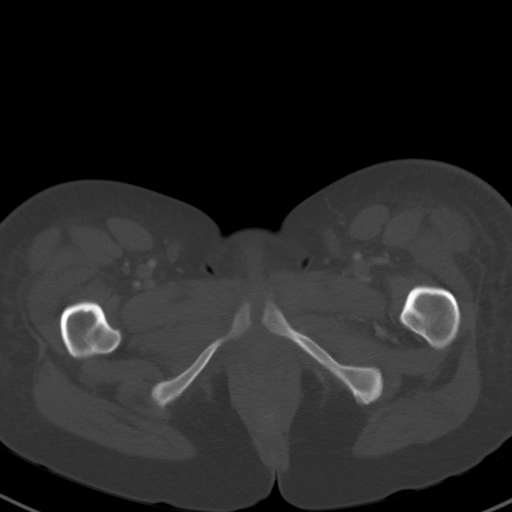
[im 14/75  soft-tissue]
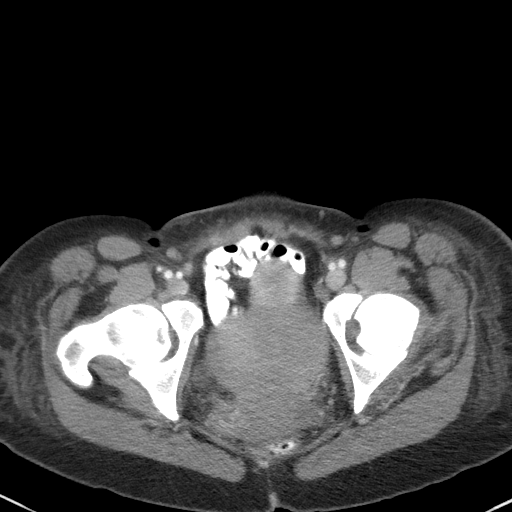
[im 18/75  soft-tissue]
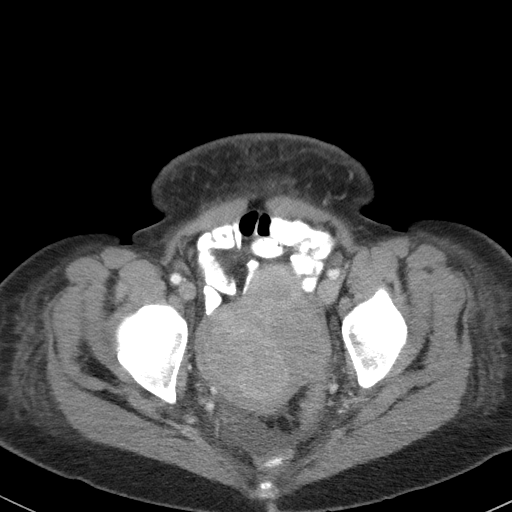
[im 22/75  soft-tissue]
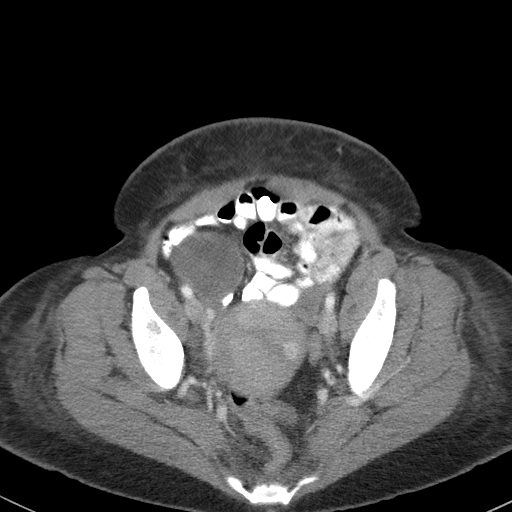
[im 31/75  soft-tissue]
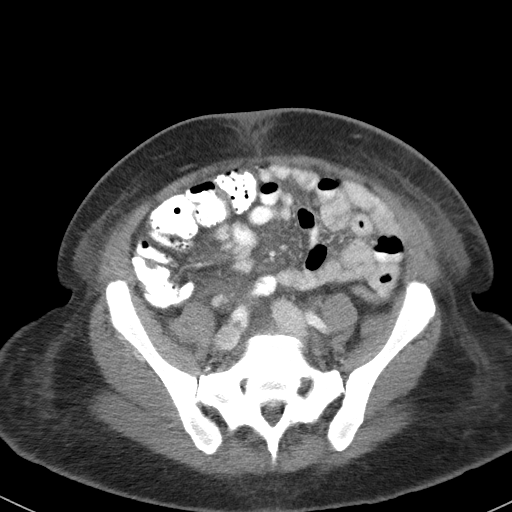
[im 35/75  soft-tissue]
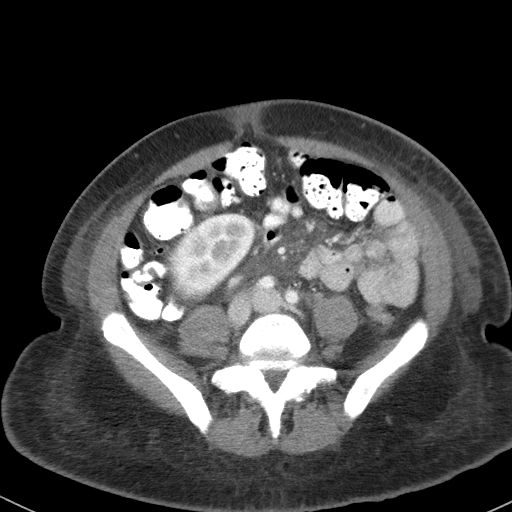
[im 40/75  soft-tissue]
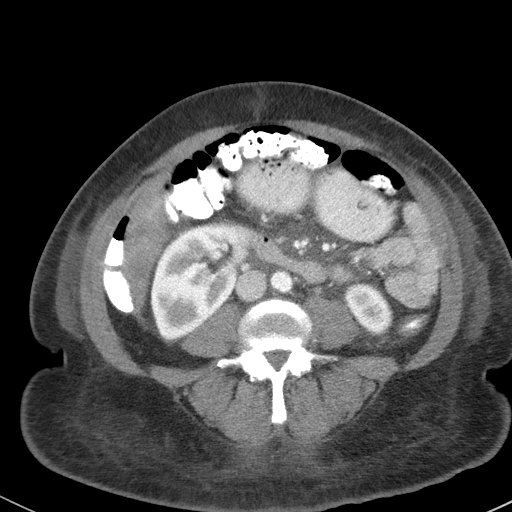
[im 48/75  soft-tissue]
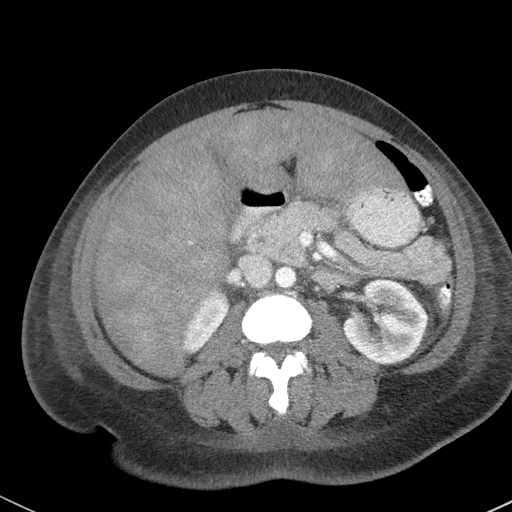
[im 53/75  soft-tissue]
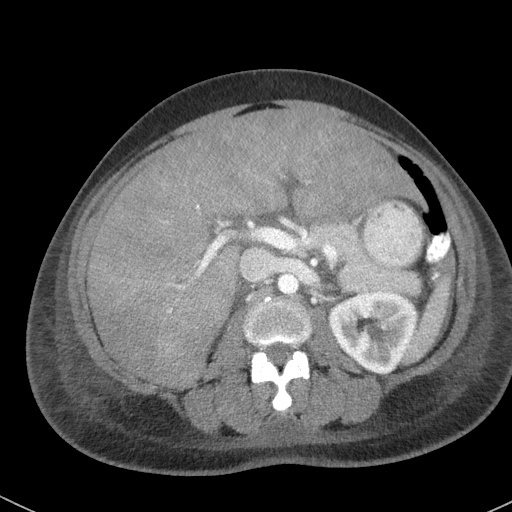
[im 53/75  bone]
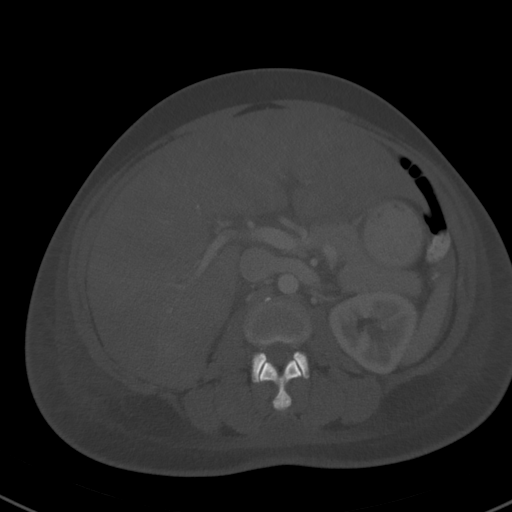
[im 57/75  soft-tissue]
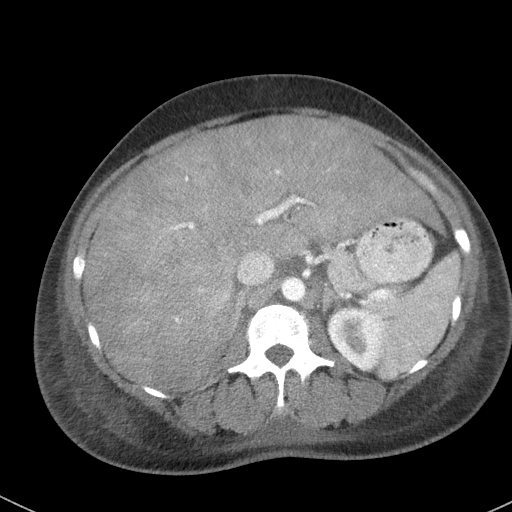
[im 66/75  soft-tissue]
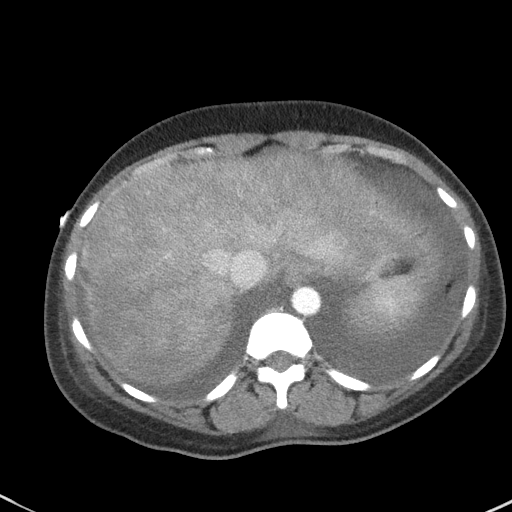
[im 70/75  soft-tissue]
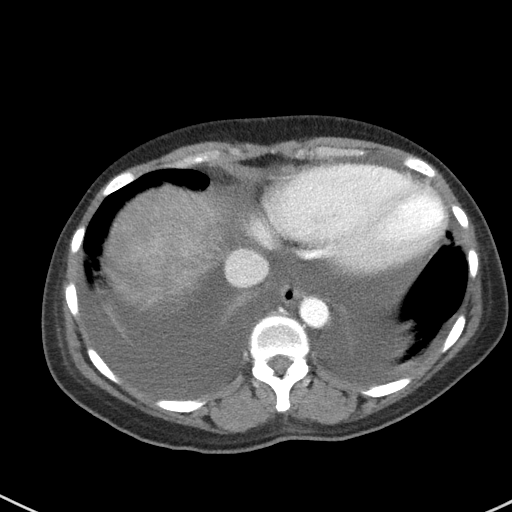

[Series 5: coronal st · coronal · 0.56mm/px · 3 of 91 slices shown]
[im 31/91  soft-tissue]
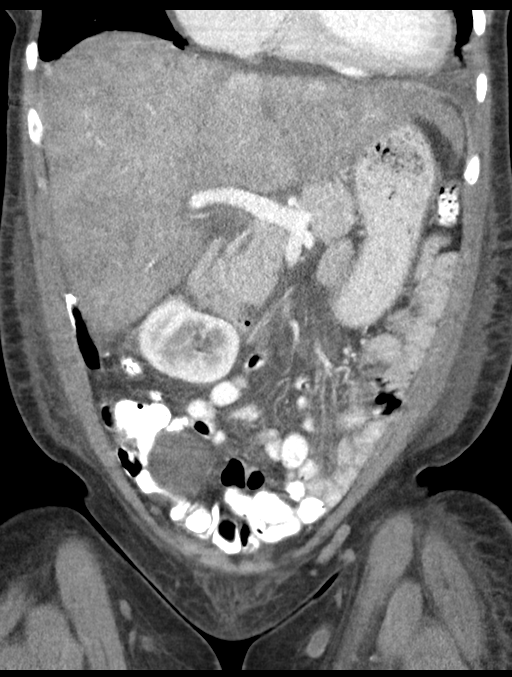
[im 41/91  soft-tissue]
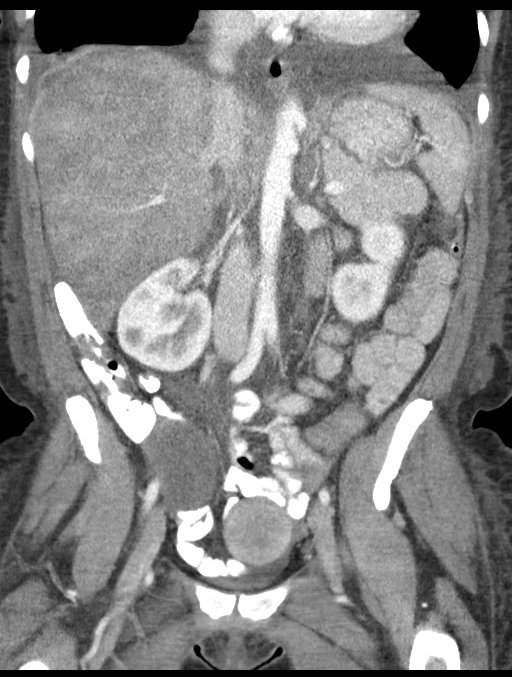
[im 51/91  soft-tissue]
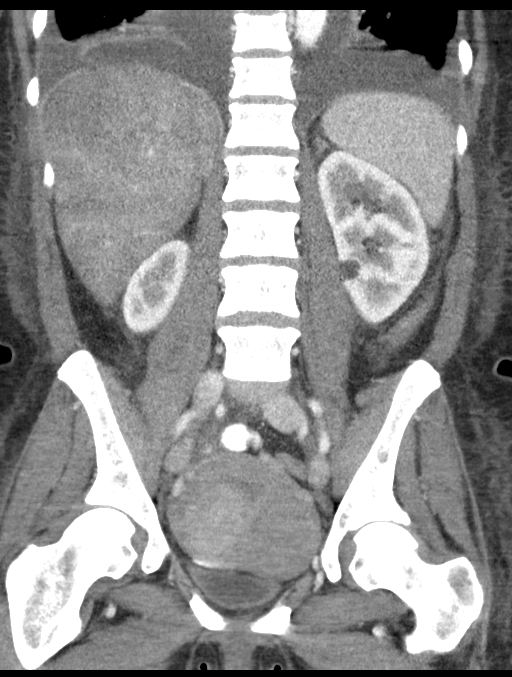

[15 of 46 positions shown; findings below may reference images not displayed]

FINDINGS: Lower chest: Moderate bilateral pleural effusions with basilar
atelectasis. Cardiac enlargement with small pericardial effusion.

Hepatobiliary: Diffuse enlargement of the liver. Heterogeneous
poorly defined low-attenuation throughout the liver. Changes may
represent a combination of passive hepatic congestion and
heterogeneous fatty infiltration. Pericholecystic fluid is
nonspecific but likely related to liver disease. No bile duct
dilatation.

Pancreas: Unremarkable. No pancreatic ductal dilatation or
surrounding inflammatory changes.

Spleen: Normal in size without focal abnormality.

Adrenals/Urinary Tract: Adrenal glands are unremarkable. Kidneys are
normal, without renal calculi, focal lesion, or hydronephrosis.
Bladder is unremarkable.

Stomach/Bowel: Stomach, small bowel, and colon are not abnormally
distended. No wall thickening or inflammatory changes are
appreciated. Appendix is normal.

Vascular/Lymphatic: No significant vascular findings are present. No
enlarged abdominal or pelvic lymph nodes.

Reproductive: Heterogeneous nodular enlargement of the uterus
consistent with uterine fibroids. Largest measures 3.8 cm diameter.
Right adnexal cyst measuring 5 cm diameter.

Other: Small amount of free fluid in the abdomen and pelvis with
mesenteric edema, likely reactive or related to liver disease.

Musculoskeletal: No acute or significant osseous findings.
IMPRESSION: 1. Diffuse enlargement of the liver with poorly defined
low-attenuation changes, likely to represent a combination of
passive hepatic congestion and heterogeneous fatty infiltration.
Hepatitis would also be a possibility.
2. Moderate bilateral pleural effusions with basilar atelectasis.
3. Cardiac enlargement with small pericardial effusion.
4. Small amount of free fluid in the abdomen and pelvis with
mesenteric edema, likely reactive or related to liver disease.
5. Uterine fibroids.
6. Right adnexal cyst measuring 5 cm diameter. No follow-up imaging
recommended. Note: This recommendation does not apply to
premenarchal patients and to those with increased risk (genetic,
family history, elevated tumor markers or other high-risk factors)
of ovarian cancer. Reference: JACR [DATE]):[id number]

## 2022-04-20 IMAGING — DX DG CHEST 2V
2 series · 2 of 2 positions shown · non-contrast
Comparison: No prior.

CLINICAL DATA: Shortness of breath.

EXAM:
CHEST - 2 VIEW

[chest pa]
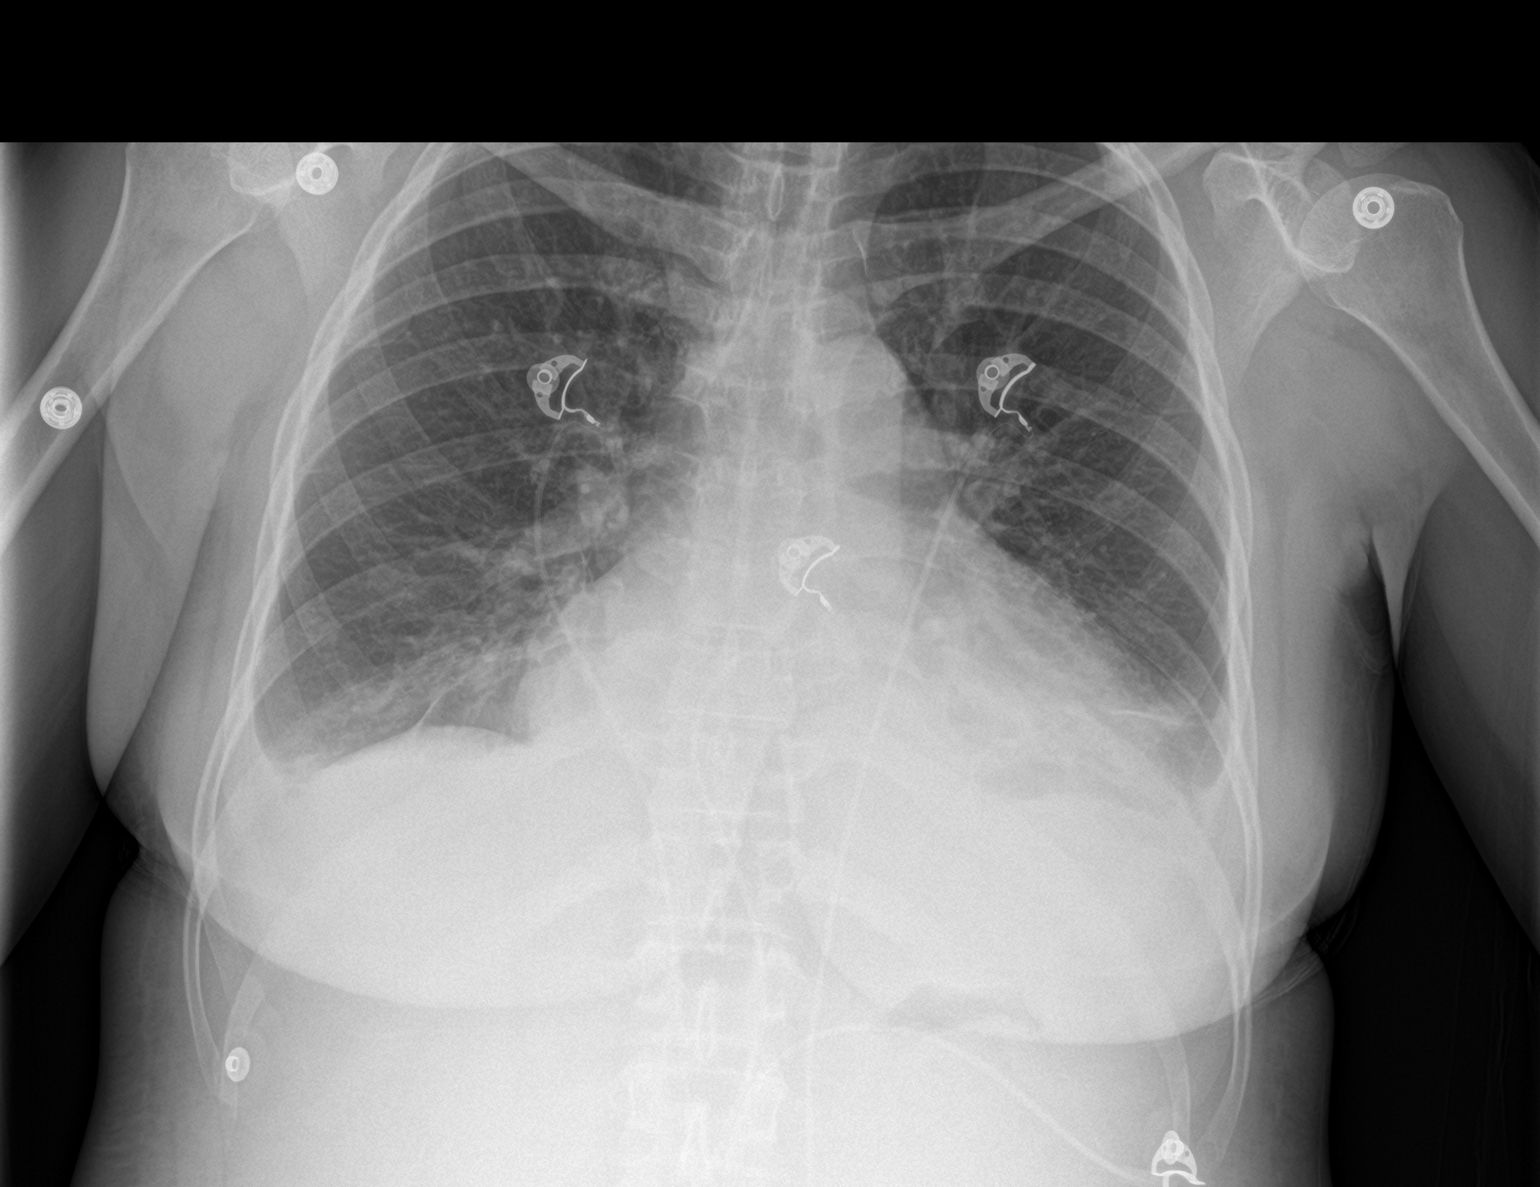

[chest lat]
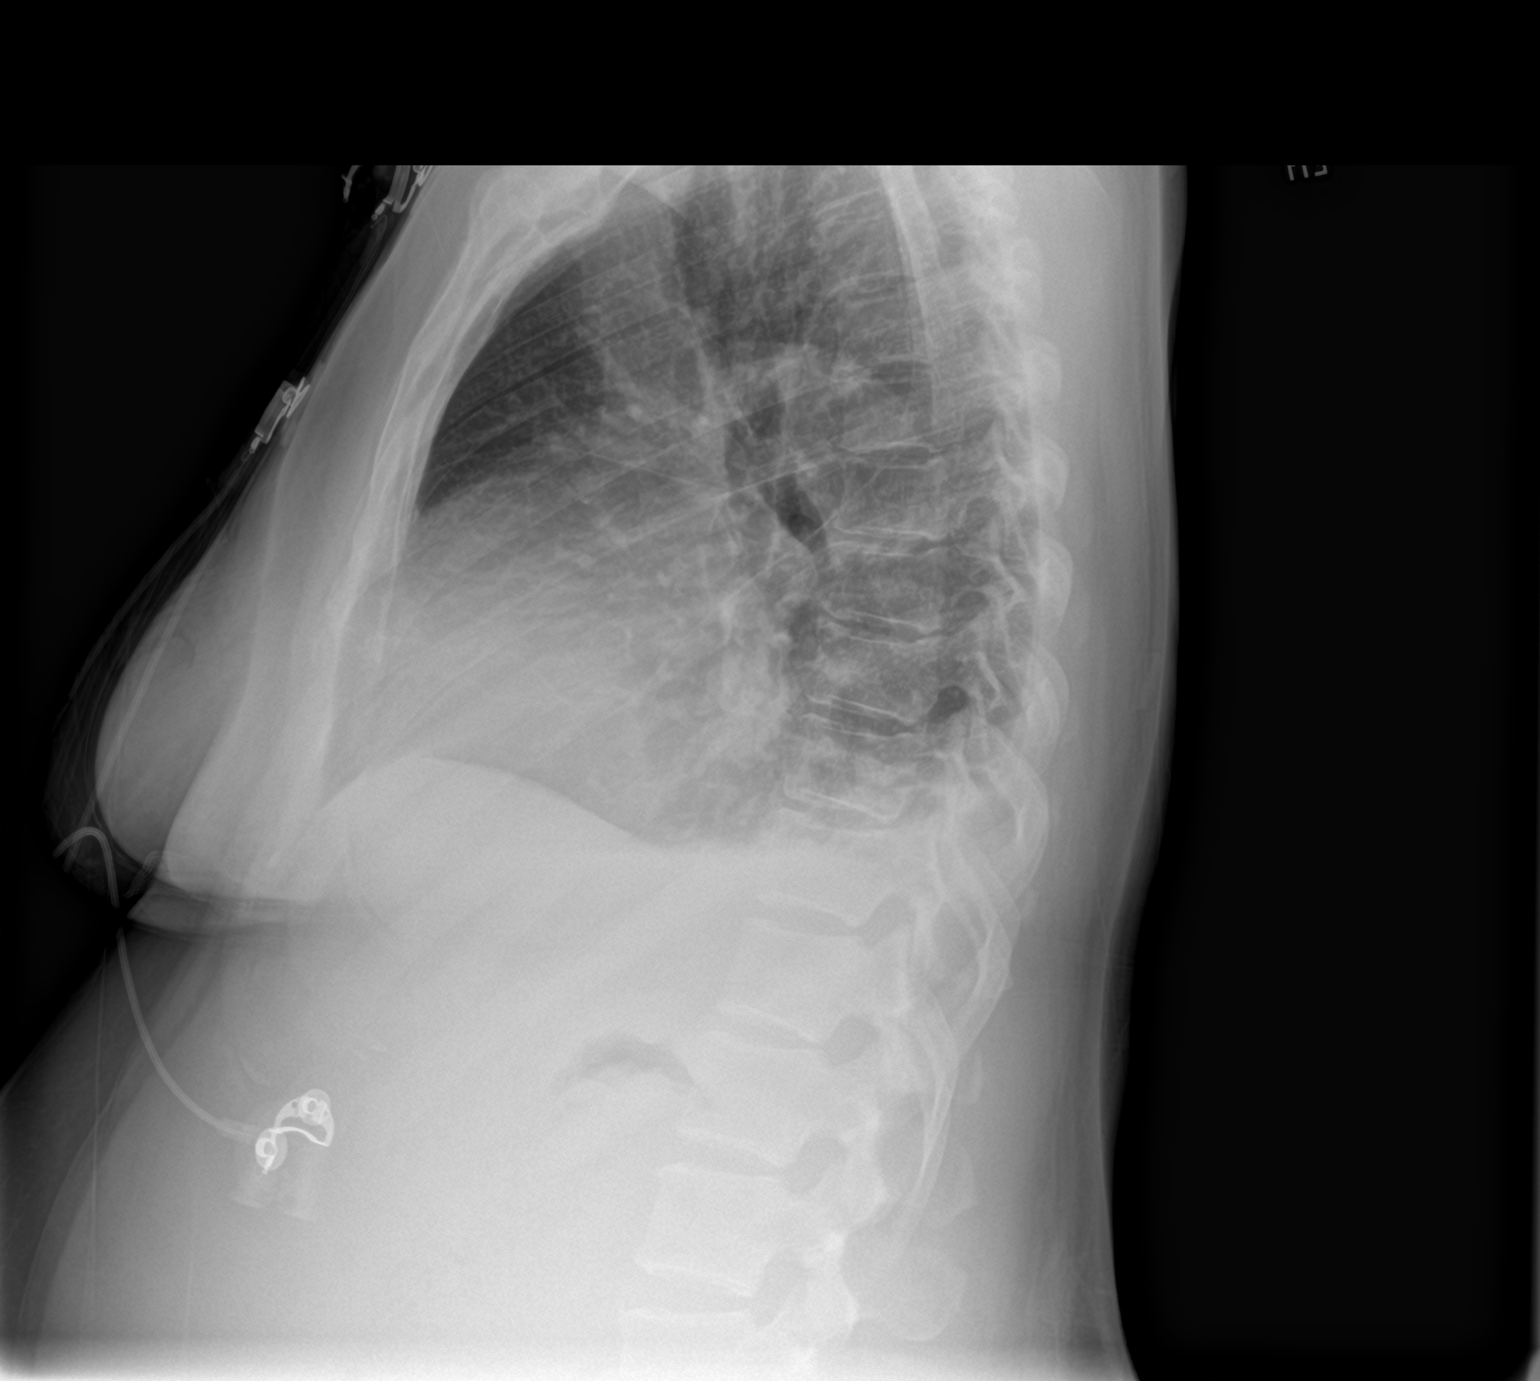

[2 of 2 positions shown; findings below may reference images not displayed]

FINDINGS: Cardiomegaly. Bibasilar atelectasis and infiltrates/edema. Small
bilateral pleural effusions. Findings most consistent with CHF.
Bibasilar pneumonia cannot be excluded. No pneumothorax.
IMPRESSION: Cardiomegaly. Bibasilar atelectasis and infiltrates/edema. Small
bilateral pleural effusions. Findings most consistent CHF.

## 2022-05-11 ENCOUNTER — Encounter: Payer: Self-pay | Admitting: Cardiovascular Disease

## 2022-05-11 ENCOUNTER — Ambulatory Visit
Payer: No Typology Code available for payment source | Attending: Cardiovascular Disease | Admitting: Cardiovascular Disease

## 2022-05-11 VITALS — BP 190/100 | HR 87 | Ht 63.0 in | Wt 161.0 lb

## 2022-05-11 DIAGNOSIS — I10 Essential (primary) hypertension: Secondary | ICD-10-CM | POA: Diagnosis not present

## 2022-05-11 DIAGNOSIS — Z79899 Other long term (current) drug therapy: Secondary | ICD-10-CM | POA: Diagnosis not present

## 2022-05-11 LAB — BASIC METABOLIC PANEL
BUN/Creatinine Ratio: 11 (ref 9–23)
BUN: 7 mg/dL (ref 6–24)
CO2: 22 mmol/L (ref 20–29)
Calcium: 9.3 mg/dL (ref 8.7–10.2)
Chloride: 103 mmol/L (ref 96–106)
Creatinine, Ser: 0.63 mg/dL (ref 0.57–1.00)
Glucose: 95 mg/dL (ref 70–99)
Potassium: 4.2 mmol/L (ref 3.5–5.2)
Sodium: 135 mmol/L (ref 134–144)
eGFR: 111 mL/min/{1.73_m2} (ref >59)

## 2022-05-11 LAB — CBC
Hematocrit: 26.7 % — ABNORMAL LOW (ref 34.0–46.6)
Hemoglobin: 8.2 g/dL — ABNORMAL LOW (ref 11.1–15.9)
MCH: 17.1 pg — ABNORMAL LOW (ref 26.6–33.0)
MCHC: 30.7 g/dL — ABNORMAL LOW (ref 31.5–35.7)
MCV: 56 fL — ABNORMAL LOW (ref 79–97)
Platelets: 626 10*3/uL — ABNORMAL HIGH (ref 150–450)
RBC: 4.8 x10E6/uL (ref 3.77–5.28)
RDW: 25.4 % — ABNORMAL HIGH (ref 11.7–15.4)
WBC: 11.6 10*3/uL — ABNORMAL HIGH (ref 3.4–10.8)

## 2022-05-11 MED ORDER — FUROSEMIDE 40 MG PO TABS: 40.0000 mg | ORAL_TABLET | Freq: Every day | ORAL | 3 refills | Status: DC

## 2022-05-11 NOTE — Progress Notes (Unsigned)
Cyst Cardiology Office Note:    Date:  05/12/2022   ID:  Kelly Guzman, DOB 05-24-1975, MRN [id number]  PCP:  Nolene Ebbs, MD   [facility] Providers Cardiologist:  Mertie Moores, MD     Referring MD: Nolene Ebbs, MD   Chief Complaint  Patient presents with   Congestive Heart Failure          Aug 14, 2020   Kelly Guzman is a 47 y.o. female with a hx of iron deficiency anemia, HTN She was admitted to the hospital [facility]) with severe anemia ( Hb = 3)  Tachycardia EF 35-40% RV volume overload  Mod - severe TR She was started on Entresto, coreg, spironolactone   Still having lots of vaginal spotting  Last Hb was 10.6 on April 16,   Could not afford entresto Is on Losartan 50 mg  Breathing is better.  Needs to avoid salt intake .  Is not taking her iron tabs. Thought they made her cramp more   Dec. 5, 2022: Kelly Guzman is seen for follow up for her CHF I met her several months ago at [facility].  She had significant heart failure related to profound anemia.  Her original LVEF was 35 to 40%.  She had moderate to severe tricuspid regurgitation.  We tried her on Entresto but she was not able to afford it.  We changed to losartan.  We had her on Losartan 100 mg a day ,  her primary MD reduced it to 50 but her BP has been running high . Will increase her losartan back  to 100 mg  a day   September 14, 2021: She became seen today for follow-up visit.  She has a history of severe anemia secondary to menstrual bleeding.  She has history of congestive heart failure and hypertension. She has run out of her spironolactone last month.  Echocardiogram from September 09, 2021 shows normal left ventricular systolic function.  She has grade 1 diastolic dysfunction.  She has mild mitral regurgitation.  Hb is up to 11   Jan. 31, 2024  Kelly Guzman is seen today for follow up of her CHF, HTN She did not take any of her medications this morning.  She took them once she arrived in  the office.  Her blood pressure is elevated. Wt is 161 lbs    Has had removal of a fibroid .   She has her uterus.  There are still smaller fibroids  Her menstral bleeding is fairly normal now  No exercise ,  wants to get to the gym .  No CP,  is chronically short of breath   She developed leg swelling before Christmas Her primary doctor started Lasix  ( is not on her med list  Still eats processed meats and fast food more frequently   She states that her blood pressure is typically normal when she goes to the doctor's office.   Past Medical History:  Diagnosis Date   Anemia    Heart failure ([place])    Hypertension     Past Surgical History:  Procedure Laterality Date   CESAREAN SECTION     gave birth to her daughter around 2002    Current Medications: Current Meds  Medication Sig   carvedilol (COREG) 25 MG tablet Take 1 tablet (25 mg total) by mouth 2 (two) times daily with a meal.   furosemide (LASIX) 40 MG tablet Take 1 tablet (40 mg total) by mouth daily.   losartan (  COZAAR) 100 MG tablet Take 1 tablet (100 mg total) by mouth daily.   medroxyPROGESTERone (PROVERA) 10 MG tablet Take by mouth. Take 2 tabs three times a day x 1 week then go to 1 tab daily   Multiple Vitamin (MULTIVITAMIN) capsule Take 1 capsule by mouth daily.   spironolactone (ALDACTONE) 25 MG tablet Take 1 tablet (25 mg total) by mouth daily.     Allergies:   Penicillins and Tape   Social History   Socioeconomic History   Marital status: Single    Spouse name: Not on file   Number of children: 1   Years of education: Not on file   Highest education level: Not on file  Occupational History   Not on file  Tobacco Use   Smoking status: Never   Smokeless tobacco: Never  Vaping Use   Vaping Use: Never used  Substance and Sexual Activity   Alcohol use: Never   Drug use: Never   Sexual activity: Not on file  Other Topics Concern   Not on file  Social History Narrative   Not on file    Social Determinants of Health   Financial Resource Strain: Not on file  Food Insecurity: Not on file  Transportation Needs: Not on file  Physical Activity: Not on file  Stress: Not on file  Social Connections: Not on file     Family History: The patient's family history includes Diabetes in her father and mother; Heart failure in her father and paternal grandfather; Hypertension in her mother.  ROS:   Please see the history of present illness.     All other systems reviewed and are negative.  EKGs/Labs/Other Studies Reviewed:    The following studies were reviewed today:   EKG:   May 11, 2022: Normal sinus rhythm at 79.  Strain pattern due to her marked hypertension.  Recent Labs: 01/07/2022: ALT 15; TSH 1.25 05/11/2022: BUN 7; Creatinine, Ser 0.63; Hemoglobin 8.2; Platelets 626; Potassium 4.2; Sodium 135  Recent Lipid Panel    Component Value Date/Time   CHOL 151 01/07/2022 1212   TRIG 95 01/07/2022 1212   HDL 51 01/07/2022 1212   CHOLHDL 3.0 01/07/2022 1212   LDLCALC 81 01/07/2022 1212     Risk Assessment/Calculations:       Physical Exam:     Physical Exam: Blood pressure (!) 190/100, pulse 87, height '5\' 3"'$  (1.6 m), weight 161 lb (73 kg), SpO2 99 %.  HYPERTENSION CONTROL Vitals:   05/11/22 1354 05/11/22 1426  BP: (!) 192/105 (!) 190/100    The patient's blood pressure is elevated above target today.  In order to address the patient's elevated BP: Blood pressure will be monitored at home to determine if medication changes need to be made.   Patient will reduce her salt intake and will take her meds  ( does not always take her meds )      GEN:  mildly obese female  in no acute distress HEENT: Normal NECK: No JVD; No carotid bruits LYMPHATICS: No lymphadenopathy CARDIAC: RRR , no murmurs, rubs, gallops RESPIRATORY:  Clear to auscultation without rales, wheezing or rhonchi  ABDOMEN: Soft, non-tender, non-distended MUSCULOSKELETAL:  No edema;  No deformity  SKIN: Warm and dry NEUROLOGIC:  Alert and oriented x 3    ASSESSMENT:    1. Primary hypertension   2. Medication management       PLAN:      Acute on chronic combined systolic and diastolic congestive heart failure:  Lasix was added to her medical regimen.  Will recheck basic metabolic profile today.  I will change her furosemide to 40 mg a day.   2.  Iron deficient anemia: Check CBC today.   3.  Hypertension: Blood pressure is markedly elevated today because she did not take her blood pressure medicines until she arrived at the office.  She states that her blood pressure is typically well-controlled.  She still eats quite a bit of salty foods and processed meats.  She eats lots of fast foods.  I advised her to work on cutting back on her processed meats.  She is in a difficult/unstable living situation now so she is relying on fast foods and processed foods for the most part.    Will have her follow-up with Richardson Dopp, PA in several months.  I would have a low threshold to add amlodipine if her blood pressure remains elevated.  Will check a CBC today to make sure she is not anemic.          Medication Adjustments/Labs and Tests Ordered: Current medicines are reviewed at length with the patient today.  Concerns regarding medicines are outlined above.  Orders Placed This Encounter  Procedures   CBC   Basic metabolic panel   EKG 03-ESPQ    Meds ordered this encounter  Medications   furosemide (LASIX) 40 MG tablet    Sig: Take 1 tablet (40 mg total) by mouth daily.    Dispense:  90 tablet    Refill:  3    Dose change      Patient Instructions  Medication Instructions:  Your physician has recommended you make the following change in your medication:   1) CHANGE furosemide (Lasix) to '40mg'$  daily  *If you need a refill on your cardiac medications before your next appointment, please call your pharmacy*  Lab Work: TODAY: BMP, CBC If you  have labs (blood work) drawn today and your tests are completely normal, you will receive your results only by: [place] (if you have MyChart) OR A paper copy in the mail If you have any lab test that is abnormal or we need to change your treatment, we will call you to review the results.  Testing/Procedures: None  Follow-Up: At [facility], you and your health needs are our priority.  As part of our continuing mission to provide you with exceptional heart care, we have created designated Provider Care Teams.  These Care Teams include your primary Cardiologist (physician) and Advanced Practice Providers (APPs -  Physician Assistants and Nurse Practitioners) who all work together to provide you with the care you need, when you need it.  Your next appointment:   3 month(s)  Provider:   Richardson Dopp, PA-C   Signed, Mertie Moores, MD  05/12/2022 9:14 PM    [place]

## 2022-05-11 NOTE — Patient Instructions (Signed)
Medication Instructions:  Your physician has recommended you make the following change in your medication:   1) CHANGE furosemide (Lasix) to '40mg'$  daily  *If you need a refill on your cardiac medications before your next appointment, please call your pharmacy*  Lab Work: TODAY: BMP, CBC If you have labs (blood work) drawn today and your tests are completely normal, you will receive your results only by: [name] (if you have MyChart) OR A paper copy in the mail If you have any lab test that is abnormal or we need to change your treatment, we will call you to review the results.  Testing/Procedures: None  Follow-Up: At [facility], you and your health needs are our priority.  As part of our continuing mission to provide you with exceptional heart care, we have created designated Provider Care Teams.  These Care Teams include your primary Cardiologist (physician) and Advanced Practice Providers (APPs -  Physician Assistants and Nurse Practitioners) who all work together to provide you with the care you need, when you need it.  Your next appointment:   3 month(s)  Provider:   Richardson Dopp, PA-C

## 2022-05-12 ENCOUNTER — Encounter: Payer: Self-pay | Admitting: Cardiovascular Disease

## 2022-06-07 ENCOUNTER — Other Ambulatory Visit: Payer: Self-pay | Admitting: Cardiovascular Disease

## 2022-08-09 ENCOUNTER — Encounter: Payer: Self-pay | Admitting: Physician Assistant

## 2022-08-09 ENCOUNTER — Ambulatory Visit: Payer: Medicaid Other | Attending: Physician Assistant | Admitting: Physician Assistant

## 2022-08-09 VITALS — BP 160/100 | HR 80 | Ht 60.0 in | Wt 153.6 lb

## 2022-08-09 DIAGNOSIS — I5032 Chronic diastolic (congestive) heart failure: Secondary | ICD-10-CM | POA: Diagnosis not present

## 2022-08-09 DIAGNOSIS — I10 Essential (primary) hypertension: Secondary | ICD-10-CM

## 2022-08-09 MED ORDER — AMLODIPINE BESYLATE 5 MG PO TABS: 5.0000 mg | ORAL_TABLET | Freq: Every day | ORAL | 3 refills | Status: DC

## 2022-08-09 NOTE — Progress Notes (Signed)
Cardiology Office Note:    Date:  08/09/2022  ID:  Kelly Guzman, DOB May 12, 1975, MRN [id number] PCP: Fleet Contras, MD  [facility] Providers Cardiologist:  Kristeen Miss, MD          Patient Profile:   HFimpEF (heart failure with improved ejection fraction)   Admx with acute systolic HF in 2022 in setting of profound anemia TTE 07/14/20: EF 35-40, mild to mod MR, mod to severe TR TTE 11/04/20: EF 50-55 TTE 09/09/2021: EF 60-65, no RWMA, GR 1 DD, normal RVSF, normal PASP, mild LAE, mild MR, RAP 3 Hypertension  Fe deficiency anemia      History of Present Illness:   Kelly Guzman is a 47 y.o. female returns for f/u of CHF, HTN. She was last seen by Dr. Elease Hashimoto 05/11/22. Her BP was markedly elevated. She was supposed to increase Lasix to 40 mg once daily. She is here alone today. She has not been taking the Lasix. She had some trouble getting it covered with insurance. She has Medicaid and was being charged for the Lasix. She is not currently working. She is currently living in shelter. Her medications cannot be in her room with her. She sometimes forgets to take the PM dose. She notes dyspnea on exertion with walking to the bus stop sometimes. She has not had chest pain, syncope, orthopnea. She has some pedal edema that resolves with elevation. She has occasional HAs. She also notes blurry vision. She does not smoke.   Review of Systems  Constitutional: Negative for fever.  Respiratory:  Negative for cough.   Gastrointestinal:  Negative for hematochezia.        Studies Reviewed:    EKG:  not done   Risk Assessment/Calculations:     HYPERTENSION CONTROL Vitals:   08/09/22 1342 08/09/22 1421  BP: (!) 161/98 (!) 160/100    The patient's blood pressure is elevated above target today.  In order to address the patient's elevated BP: A new medication was prescribed today.          Physical Exam:   VS:  BP (!) 160/100   Pulse 80   Ht 5' (1.524 m)   Wt 153 lb 9.6  oz (69.7 kg)   SpO2 99%   BMI 30.00 kg/m    Wt Readings from Last 3 Encounters:  08/09/22 153 lb 9.6 oz (69.7 kg)  05/11/22 161 lb (73 kg)  09/14/21 160 lb 6.4 oz (72.8 kg)    Constitutional:      Appearance: Healthy appearance. Not in distress.  Neck:     Vascular: JVD normal.  Pulmonary:     Breath sounds: Normal breath sounds. No wheezing. No rales.  Cardiovascular:     Normal rate. Regular rhythm.     Murmurs: There is no murmur.  Edema:    Peripheral edema present.    Feet: bilateral trace edema of the feet. Abdominal:     Palpations: Abdomen is soft.       ASSESSMENT AND PLAN:   Essential hypertension BP remains uncontrolled. She has not been taking Lasix. She sometimes misses her PM Coreg. She cannot afford a BP cuff to monitor her BP on her own. Continue Coreg 25 mg twice daily, Losartan 100 mg once daily, Spironolactone 25 mg once daily Restart Lasix 40 mg once daily Start Amlodipine 5 mg once daily Supply BP cuff F/u in 4 weeks If BP close to target, consider increasing Amlodipine vs changing Losartan to Valsartan  Chronic heart failure  with preserved ejection fraction (HFpEF) (HCC) She notes some shortness of breath with exertion at times. NYHA IIb. Volume status appears stable. Resume Lasix 40 mg once daily. This should help her BP as well. Continue Losartan 100 mg once daily, Coreg 25 mg twice daily, Spironolactone 25 mg once daily. BMET 1 week. F/u 4 weeks.      Dispo:  Return in about 4 weeks (around 09/06/2022) for Routine Follow Up, w/ Tereso Newcomer, PA-C.  Signed, Tereso Newcomer, PA-C

## 2022-08-09 NOTE — Patient Instructions (Signed)
Medication Instructions:  Your physician has recommended you make the following change in your medication:   START Amlodipine 5 mg taking 1 daily  RESTART Lasix 40 mg taking 1 daily  *If you need a refill on your cardiac medications before your next appointment, please call your pharmacy*   Lab Work: 1 WEEK AFTER YOU START THE LASIX BACK, COME INTO THE OFFICE FOR:   BMET  If you have labs (blood work) drawn today and your tests are completely normal, you will receive your results only by: MyChart Message (if you have MyChart) OR A paper copy in the mail If you have any lab test that is abnormal or we need to change your treatment, we will call you to review the results.   Testing/Procedures: None ordered   Follow-Up: At [facility], you and your health needs are our priority.  As part of our continuing mission to provide you with exceptional heart care, we have created designated Provider Care Teams.  These Care Teams include your primary Cardiologist (physician) and Advanced Practice Providers (APPs -  Physician Assistants and Nurse Practitioners) who all work together to provide you with the care you need, when you need it.  We recommend signing up for the patient portal called "MyChart".  Sign up information is provided on this After Visit Summary.  MyChart is used to connect with patients for Virtual Visits (Telemedicine).  Patients are able to view lab/test results, encounter notes, upcoming appointments, etc.  Non-urgent messages can be sent to your provider as well.   To learn more about what you can do with MyChart, go to [email or web address].    Your next appointment:   3 week(s)  Provider:   Tereso Newcomer, PA-C         Other Instructions

## 2022-08-09 NOTE — Assessment & Plan Note (Signed)
She notes some shortness of breath with exertion at times. NYHA IIb. Volume status appears stable. Resume Lasix 40 mg once daily. This should help her BP as well. Continue Losartan 100 mg once daily, Coreg 25 mg twice daily, Spironolactone 25 mg once daily. BMET 1 week. F/u 4 weeks.

## 2022-08-09 NOTE — Assessment & Plan Note (Signed)
BP remains uncontrolled. She has not been taking Lasix. She sometimes misses her PM Coreg. She cannot afford a BP cuff to monitor her BP on her own. Continue Coreg 25 mg twice daily, Losartan 100 mg once daily, Spironolactone 25 mg once daily Restart Lasix 40 mg once daily Start Amlodipine 5 mg once daily Supply BP cuff F/u in 4 weeks If BP close to target, consider increasing Amlodipine vs changing Losartan to Valsartan

## 2022-08-10 ENCOUNTER — Telehealth: Payer: Self-pay | Admitting: Licensed Clinical Social Worker

## 2022-08-10 ENCOUNTER — Other Ambulatory Visit: Payer: Self-pay | Admitting: Internal Medicine

## 2022-08-10 NOTE — Progress Notes (Signed)
Heart and Vascular Care Navigation  08/10/2022  Kelly Guzman May 30, 1975 [id number]  Reason for Referral: living in shelter, challenges with transportation and food.  Patient is participating in a Managed Medicaid Plan:Yes- [employer]  Engaged with patient by telephone for initial visit for Heart and Vascular Care Coordination.                                                                                                   Assessment:  LCSW spoke with pt this morning at [phone]. Introduced self, role, reason for call. Pt confirmed current address is [address] [place], [place]. She has no current income, does have Kelly Guzman Medicaid. She uses transportation through [place] but otherwise doesn't have transportation. She has applied for housing with assistance of [facility] and tentatively is trying to work on moving to [place] to live with a friend. She has no emergency contacts other than PCP at this time. She share that she is interested in resources for transportation, food and any housing. Pt receives meals at shelter but often dont meet heart healthy criteria. Pt also struggles with copays for medications and care. We discussed how to reach out to financial counseling if bills reach $5000 or more, otherwise no current copay assistance programs. Also discussed [facility] and [employer], copay waivers and mail order programs.   No additional questions, she will call me when resources received. Discussed if she doesn't call then I will reach out.                                      HRT/VAS Care Coordination     Patients Home Cardiology Office [facility]   Outpatient Care Team Social Worker   Social Worker Name: Octavio Graves, [place], [phone]   Living arrangements for the past 2 months Homeless Shelter   Lives with: Self   Patient Current Insurance Coverage Medicaid   Patient Has Concern With  Paying Medical Bills Yes   Patient Concerns With Medical Bills has no income, cannot pay for copays   Medical Bill Referrals: LCSW discussed having them billed, unfortunately no assistance for copays and not at threshold for financial hardship.   Does Patient Have Prescription Coverage? Yes   Patient Prescription Assistance Programs Other   Other Assistance Programs Medications discussed copay waive and delivery- discussed reaching out to [facility]   Home Assistive Devices/Equipment None       Social History:                                                                             SDOH Screenings   Food Insecurity: Food Insecurity Present (08/10/2022)  Housing:  High Risk (08/10/2022)  Transportation Needs: Unmet Transportation Needs (08/10/2022)  Utilities: Not At Risk (08/10/2022)  Financial Resource Strain: High Risk (08/10/2022)  Tobacco Use: Low Risk  (08/09/2022)    SDOH Interventions: Financial Resources:  Financial Strain Interventions: Other (Comment) (no income- living in shelter, sent resources for transportation and food)  Food Insecurity:  Food Insecurity Interventions: Other (Comment) (mailed [employer], Animal nutritionist, pt has three meals a day but not always healthy)  Housing Insecurity:  Housing Interventions: Other (Comment) (currently at [employer], no income, working on finding alternate housing)  Transportation:   Transportation Interventions: Associate Professor, [employer] (Research scientist (life sciences))    Other Care Navigation Interventions:     Provided Pharmacy assistance resources Other- discussed copay waiver and delivery from [employer]- not sure if they come to [employer] but [employer] also deliver   Follow-up plan:   LCSW has mailed pt my card, food resources, transportation resources, access [place] information, and sticky notes reminding pt to update address for Medicaid with DSS and how to contact [facility] if needed. If I find  any additional resources with housing will send to pt.

## 2022-08-11 LAB — BASIC METABOLIC PANEL WITH GFR
BUN: 13 mg/dL (ref 7–25)
CO2: 23 mmol/L (ref 20–32)
Calcium: 9.2 mg/dL (ref 8.6–10.2)
Chloride: 106 mmol/L (ref 98–110)
Creat: 0.81 mg/dL (ref 0.50–0.99)
Glucose, Bld: 80 mg/dL (ref 65–99)
Potassium: 4.1 mmol/L (ref 3.5–5.3)
Sodium: 139 mmol/L (ref 135–146)
eGFR: 91 mL/min/{1.73_m2} (ref >=60)

## 2022-08-11 LAB — CBC
HCT: 27.7 % — ABNORMAL LOW (ref 35.0–45.0)
Hemoglobin: 7.7 g/dL — ABNORMAL LOW (ref 11.7–15.5)
MCH: 16.5 pg — ABNORMAL LOW (ref 27.0–33.0)
MCHC: 27.8 g/dL — ABNORMAL LOW (ref 32.0–36.0)
MCV: 59.4 fL — ABNORMAL LOW (ref 80.0–100.0)
MPV: 9.4 fL (ref 7.5–12.5)
Platelets: 626 10*3/uL — ABNORMAL HIGH (ref 140–400)
RBC: 4.66 10*6/uL (ref 3.80–5.10)
RDW: 21.6 % — ABNORMAL HIGH (ref 11.0–15.0)
WBC: 9.6 10*3/uL (ref 3.8–10.8)

## 2022-08-18 ENCOUNTER — Telehealth: Payer: Self-pay | Admitting: Licensed Clinical Social Worker

## 2022-08-18 NOTE — Telephone Encounter (Signed)
H&V Care Navigation CSW Progress Note  Clinical Social Worker contacted patient by phone to f/u on assistance items sent to her. Initially left a message for pt at [phone]. This was returned by pt, she shares that she has not yet received the mail but will check to see if it has arrived tomorrow. I requested once she has received and reviewed it that she give me a call. I will f/u next week if I dont hear from her tomorrow. No additional questions at this time, shares she spoke with [facility] and is unfortunately still on waitlist for Permanent Supportive Housing. I remain available as needed.   Patient is participating in a Managed Medicaid Plan:  No, self pay only.   SDOH Screenings   Food Insecurity: Food Insecurity Present (08/10/2022)  Housing: High Risk (08/10/2022)  Transportation Needs: Unmet Transportation Needs (08/10/2022)  Utilities: Not At Risk (08/10/2022)  Financial Resource Strain: High Risk (08/10/2022)  Tobacco Use: Low Risk  (08/09/2022)   Octavio Graves, MSW, LCSW Clinical Social Worker II [facility] Heart/Vascular Care Navigation  [phone]- work cell phone (preferred) [phone]- desk phone

## 2022-08-25 ENCOUNTER — Telehealth: Payer: Self-pay | Admitting: Licensed Clinical Social Worker

## 2022-08-25 NOTE — Telephone Encounter (Signed)
H&V Care Navigation CSW Progress Note  Clinical Social Worker contacted patient by phone to f/u on resources sent. Voicemail left for pt at [phone] to f/u with me if any additional questions/concerns or resources requested at this time. I remain available.   Patient is participating in a Managed Medicaid Plan:  No, self pay only  SDOH Screenings   Food Insecurity: Food Insecurity Present (08/10/2022)  Housing: High Risk (08/10/2022)  Transportation Needs: Unmet Transportation Needs (08/10/2022)  Utilities: Not At Risk (08/10/2022)  Financial Resource Strain: High Risk (08/10/2022)  Tobacco Use: Low Risk  (08/09/2022)   Octavio Graves, MSW, LCSW Clinical Social Worker II [facility] Heart/Vascular Care Navigation  [phone]- work cell phone (preferred) [phone]- desk phone

## 2022-08-26 ENCOUNTER — Telehealth: Payer: Self-pay | Admitting: Cardiovascular Disease

## 2022-08-26 NOTE — Telephone Encounter (Signed)
Patient is calling stating her PCP told her today she is needing a blood transfusion and advised her to call our office to arrange it. Please advise.

## 2022-08-26 NOTE — Telephone Encounter (Signed)
Left message to call the clinic. 

## 2022-08-29 ENCOUNTER — Telehealth: Payer: Self-pay | Admitting: Licensed Clinical Social Worker

## 2022-08-29 NOTE — Telephone Encounter (Signed)
H&V Care Navigation CSW Progress Note  Clinical Social Worker contacted patient by phone to f/u on missed voicemail while out of office on Friday. Was able to reach pt this morning at [phone]. She confirmed she has a new phone and again will be using her [phone] phone number. After several dropped calls and difficulty with pt being able to hear me I was able to connect with her. She shares that she has been told her last day in shelter is Friday, 5/24. She does not currently have anywhere to go after this time. She has connected with [facility] but is still on their waitlist for Rapid Rehousing. She does not have any current income, disability pending.   I have conferred with my colleagues and reached out to [facility] and they sent me a shelter list stating those are the only options they have at this time. Unfortunately, without any additional financial supports or income we are not able to utilize the Patient Care Fund at this time. I will f/u with pt tomorrow as requested and let her know/connect her with any shelter numbers she does not have at this time  Patient is participating in a Managed Medicaid Plan:  Yes- [employer]  SDOH Screenings   Food Insecurity: Food Insecurity Present (08/10/2022)  Housing: High Risk (08/10/2022)  Transportation Needs: Unmet Transportation Needs (08/10/2022)  Utilities: Not At Risk (08/10/2022)  Financial Resource Strain: High Risk (08/10/2022)  Tobacco Use: Low Risk  (08/09/2022)   Octavio Graves, MSW, LCSW Clinical Social Worker II [facility] Heart/Vascular Care Navigation  [phone]- work cell phone (preferred) [phone]- desk phone

## 2022-08-29 NOTE — Telephone Encounter (Signed)
Spoke to the patient, she was advised by her PCP that her hemoglobin is decreasing , she will need a referral from cardiology for a blood transfusion. Pt asked if the referral can be placed prior to her scheduled appointment with APP because she is scheduled to have myomectomy next month. Will forward to APP for advise.

## 2022-08-29 NOTE — Telephone Encounter (Signed)
Patient is returning call.  °

## 2022-08-30 ENCOUNTER — Telehealth: Payer: Self-pay | Admitting: Licensed Clinical Social Worker

## 2022-08-30 NOTE — Telephone Encounter (Signed)
H&V Care Navigation CSW Progress Note  Clinical Social Worker contacted patient by phone to f/u on update from [facility]. Unfortunately [facility] will not provide me any information about her current status on the wait list. They asked for pt to contact them directly with any questions, I was able to reach pt this afternoon at [phone] and shared the above. Unfortunately pt does not have any other current applications in for housing, she has a meeting planned with Immunologist to see if she can continue to stay there past Friday. If we hear of any additional housing options I will pass that on to pt.   Patient is participating in a Managed Medicaid Plan:  Yes- [employer]  SDOH Screenings   Food Insecurity: Food Insecurity Present (08/10/2022)  Housing: High Risk (08/10/2022)  Transportation Needs: Unmet Transportation Needs (08/10/2022)  Utilities: Not At Risk (08/10/2022)  Financial Resource Strain: High Risk (08/10/2022)  Tobacco Use: Low Risk  (08/09/2022)   Octavio Graves, MSW, LCSW Clinical Social Worker II [facility] Health Heart/Vascular Care Navigation  [phone]- work cell phone (preferred) [phone]- desk phone

## 2022-08-30 NOTE — Telephone Encounter (Addendum)
Cardiology does not arrange blood transfusions. She should have a transfusion arranged through primary care. I reviewed this with Dr. Elease Hashimoto who agreed. Please notify patient and PCP. It looks like she has had transfusions in the past through the heme/onc clinic (last one was in 05/2021). Tereso Newcomer, PA-C    08/30/2022 3:55 PM

## 2022-08-31 ENCOUNTER — Ambulatory Visit: Payer: Medicaid Other | Attending: Physician Assistant | Admitting: Physician Assistant

## 2022-08-31 ENCOUNTER — Encounter: Payer: Self-pay | Admitting: Physician Assistant

## 2022-08-31 ENCOUNTER — Telehealth: Payer: Self-pay | Admitting: Physician Assistant

## 2022-08-31 VITALS — BP 180/110 | HR 78 | Ht 60.0 in | Wt 152.4 lb

## 2022-08-31 DIAGNOSIS — I10 Essential (primary) hypertension: Secondary | ICD-10-CM

## 2022-08-31 DIAGNOSIS — Z5941 Food insecurity: Secondary | ICD-10-CM

## 2022-08-31 MED ORDER — FUROSEMIDE 40 MG PO TABS: 20.0000 mg | ORAL_TABLET | Freq: Every day | ORAL | 3 refills | Status: DC

## 2022-08-31 MED ORDER — VALSARTAN 160 MG PO TABS: 160.0000 mg | ORAL_TABLET | Freq: Every day | ORAL | 1 refills | Status: DC

## 2022-08-31 NOTE — Progress Notes (Signed)
Cardiology Office Note:    Date:  08/31/2022  ID:  Kelly Guzman, DOB 05-27-1975, MRN [id number] PCP: Fleet Contras, MD  [facility] Providers Cardiologist:  Kristeen Miss, MD           Patient Profile:   HFimpEF (heart failure with improved ejection fraction)   Admx with acute systolic HF in 2022 in setting of profound anemia TTE 07/14/20: EF 35-40, mild to mod MR, mod to severe TR TTE 11/04/20: EF 50-55 TTE 09/09/2021: EF 60-65, no RWMA, GR 1 DD, normal RVSF, normal PASP, mild LAE, mild MR, RAP 3 Hypertension  Fe deficiency anemia       History of Present Illness:   Kelly Guzman is a 47 y.o. female who returns for follow-up of hypertension.  She was last seen 08/09/2022.  Blood pressure was markedly elevated.  I placed her on amlodipine and resumed her furosemide.  Of note, we received a message recently that her PCP wanted her to have a blood transfusion for worsening anemia.  It was recommended that the blood transfusion can be arranged by our office.  We have notified her PCP that the transfusion would have to be done through primary care or hematology. She is here alone. She has not started on Amlodipine or Lasix yet. She is going to the pharmacy today. She has an appt with hematology 5/28. She has not had chest pain, syncope, orthopnea, edema. She does get short of breath with mild to mod activities.   Review of Systems  Constitutional:       +Hunger   See HPI    Studies Reviewed:    EKG:  not done  Risk Assessment/Calculations:     HYPERTENSION CONTROL Vitals:   08/31/22 1345 08/31/22 1443  BP: (!) 173/98 (!) 180/110    The patient's blood pressure is elevated above target today.  In order to address the patient's elevated BP: A new medication was prescribed today.          Physical Exam:   VS:  BP (!) 180/110   Pulse 78   Ht 5' (1.524 m)   Wt 152 lb 6.4 oz (69.1 kg)   SpO2 99%   BMI 29.76 kg/m    Wt Readings from Last 3 Encounters:  08/31/22  152 lb 6.4 oz (69.1 kg)  08/09/22 153 lb 9.6 oz (69.7 kg)  05/11/22 161 lb (73 kg)    Constitutional:      Appearance: Healthy appearance. Not in distress.  Neck:     Vascular: JVD normal.  Pulmonary:     Breath sounds: Normal breath sounds. No wheezing. No rales.  Cardiovascular:     Normal rate. Regular rhythm.     Murmurs: There is no murmur.  Edema:    Peripheral edema absent.  Abdominal:     Palpations: Abdomen is soft.       ASSESSMENT AND PLAN:   Essential hypertension BP remains markedly elevated. She has not started Amlodipine, Lasix. She is going to pick those up today. I think she would do better on a more potent ARB than Losartan.  Stop Losartan Start Valsartan 160 mg once daily Start on Amlodipine 5 mg once daily as planned. Reduce Lasix to 20 mg once daily  BMET 1 week Continue Coreg 25 mg twice daily, Spironolactone 25 mg once daily  F/u 3-4 weeks.   Food insecurity The food options at the shelter where she lives are not good. They are often very salty. She tries to  eat a low salt diet and because of limitations in access, she has to go hungry. She has been working with the Care Research officer, political party. Options for food have previously been sent to her. Continue f/u with Care Navigation Team.       Dispo:  Return in about 4 weeks (around 09/28/2022) for Close Follow Up, w/ Tereso Newcomer, PA-C.  Signed, Tereso Newcomer, PA-C

## 2022-08-31 NOTE — Assessment & Plan Note (Signed)
BP remains markedly elevated. She has not started Amlodipine, Lasix. She is going to pick those up today. I think she would do better on a more potent ARB than Losartan.  Stop Losartan Start Valsartan 160 mg once daily Start on Amlodipine 5 mg once daily as planned. Reduce Lasix to 20 mg once daily  BMET 1 week Continue Coreg 25 mg twice daily, Spironolactone 25 mg once daily  F/u 3-4 weeks.

## 2022-08-31 NOTE — Patient Instructions (Addendum)
Medication Instructions:  Your physician has recommended you make the following change in your medication:   STOP Losartan  START Valsartan 160 taking 1 daily PICK THIS UP TODAY SO YOU CAN START TOMORROW  REDUCE Lasix to 40 mg taking only 1/2 tablet daily   *If you need a refill on your cardiac medications before your next appointment, please call your pharmacy*   Lab Work: 1 WEEK, WHEN YOU GO TO THE CANCER CENTER, HAVE THEM DRAW A BMET, THE ORDER IS IN  If you have labs (blood work) drawn today and your tests are completely normal, you will receive your results only by: MyChart Message (if you have MyChart) OR A paper copy in the mail If you have any lab test that is abnormal or we need to change your treatment, we will call you to review the results.   Testing/Procedures: None ordered   Follow-Up: At [facility] you and your health needs are our priority.  As part of our continuing mission to provide you with exceptional heart care, we have created designated Provider Care Teams.  These Care Teams include your primary Cardiologist (physician) and Advanced Practice Providers (APPs -  Physician Assistants and Nurse Practitioners) who all work together to provide you with the care you need, when you need it.  We recommend signing up for the patient portal called "MyChart".  Sign up information is provided on this After Visit Summary.  MyChart is used to connect with patients for Virtual Visits (Telemedicine).  Patients are able to view lab/test results, encounter notes, upcoming appointments, etc.  Non-urgent messages can be sent to your provider as well.   To learn more about what you can do with MyChart, go to [email or web address].    Your next appointment:   4 week(s)  Provider:   Tereso Newcomer, PA-C         Other Instructions  Pharmacies that deliver:  Also discussed [facility] and [facility], copay waivers and mail order programs

## 2022-08-31 NOTE — Assessment & Plan Note (Signed)
The food options at the shelter where she lives are not good. They are often very salty. She tries to eat a low salt diet and because of limitations in access, she has to go hungry. She has been working with the Care Research officer, political party. Options for food have previously been sent to her. Continue f/u with Care Navigation Team.

## 2022-09-02 ENCOUNTER — Telehealth: Payer: Self-pay | Admitting: Physician Assistant

## 2022-09-06 ENCOUNTER — Inpatient Hospital Stay: Payer: Medicaid Other | Admitting: Physician Assistant

## 2022-09-06 ENCOUNTER — Other Ambulatory Visit: Payer: Self-pay | Admitting: Physician Assistant

## 2022-09-06 ENCOUNTER — Inpatient Hospital Stay: Payer: Medicaid Other

## 2022-09-06 DIAGNOSIS — D500 Iron deficiency anemia secondary to blood loss (chronic): Secondary | ICD-10-CM

## 2022-09-08 ENCOUNTER — Other Ambulatory Visit: Payer: Self-pay | Admitting: Internal Medicine

## 2022-09-09 LAB — BASIC METABOLIC PANEL WITH GFR
BUN: 13 mg/dL (ref 7–25)
CO2: 22 mmol/L (ref 20–32)
Calcium: 9.3 mg/dL (ref 8.6–10.2)
Chloride: 107 mmol/L (ref 98–110)
Creat: 0.66 mg/dL (ref 0.50–0.99)
Glucose, Bld: 91 mg/dL (ref 65–99)
Potassium: 3.8 mmol/L (ref 3.5–5.3)
Sodium: 138 mmol/L (ref 135–146)
eGFR: 109 mL/min/{1.73_m2} (ref >=60)

## 2022-09-09 LAB — CBC
HCT: 26.9 % — ABNORMAL LOW (ref 35.0–45.0)
Hemoglobin: 7.4 g/dL — ABNORMAL LOW (ref 11.7–15.5)
MCH: 16.3 pg — ABNORMAL LOW (ref 27.0–33.0)
MCHC: 27.5 g/dL — ABNORMAL LOW (ref 32.0–36.0)
MCV: 59.1 fL — ABNORMAL LOW (ref 80.0–100.0)
MPV: 9.2 fL (ref 7.5–12.5)
Platelets: 569 10*3/uL — ABNORMAL HIGH (ref 140–400)
RBC: 4.55 10*6/uL (ref 3.80–5.10)
RDW: 20.8 % — ABNORMAL HIGH (ref 11.0–15.0)
WBC: 8.9 10*3/uL (ref 3.8–10.8)

## 2022-09-10 LAB — SARS-COV-2 RNA,(COVID-19) QUALITATIVE NAAT: SARS CoV2 RNA: NOT DETECTED

## 2022-09-15 ENCOUNTER — Telehealth: Payer: Self-pay | Admitting: Licensed Clinical Social Worker

## 2022-09-15 NOTE — Telephone Encounter (Signed)
H&V Care Navigation CSW Progress Note  Clinical Social Worker contacted patient by phone to f/u on current housing status. Pt answered at [phone], shares she did have to leave [employer] has been staying with a friend, address updated in Epic. She is still on waitlist (#[phone]) for [facility], filled out [place] [employer] application. No additional leads at this time, updated about upcoming appointments. Encouraged her to call if any additional questions. No additional housing resources at this time, but will connect with pt if any arise.   Patient is participating in a Managed Medicaid Plan:  Yes  SDOH Screenings   Food Insecurity: Food Insecurity Present (08/10/2022)  Housing: High Risk (08/10/2022)  Transportation Needs: Unmet Transportation Needs (08/10/2022)  Utilities: Not At Risk (08/10/2022)  Financial Resource Strain: High Risk (08/10/2022)  Tobacco Use: Low Risk  (08/31/2022)   Octavio Graves, MSW, LCSW Clinical Social Worker II [facility] Health Heart/Vascular Care Navigation  [phone]- work cell phone (preferred) [phone]- desk phone

## 2022-09-18 NOTE — Progress Notes (Unsigned)
[facility] [phone]   [phone]  PROGRSS NOTE  Patient Care Team: Fleet Contras, MD as PCP - General (Internal Medicine) Nahser, Deloris Ping, MD as PCP - Cardiology (Cardiology)  Hematological/Oncological History # Iron Deficiency Anemia 2/2 to GYN Bleeding -04/26/2017: Hgb 4.8, Iron <10, ferritin 1, Iron sat 1%, TIBC 508 -07/10/2020: WBC 6.4, Hgb 3.0, MCV 55, Plt 643 -07/13/2020: establish care with Dr. Leonides Schanz  -07/13/2020-07/18/2020: admitted for acute anemia with Hgb of 3.5. received 3 units of PRBC and Hgb treneded up to 9.8.  --07/22/2020-07/23/2020: readmitted for acute anemia with Hgb of 7.3. Received 1 unit of PRBC.  --01/05/2021: presented to ED for abnormal uterine bleeding. Hgb was 7.4 so received 1 unit of PRBC --05/19/2021: Hgb 7.6, plan to give 2 units of PRBC on 05/21/2021 --09/19/2022: lost to follow up. Reconnected with our service. WBC 8.2, Hgb 7.1, MCV 57.7, Plt 535  CHIEF COMPLAINTS/PURPOSE OF CONSULTATION:  " Iron Deficiency Anemia  "   HISTORY OF PRESENTING ILLNESS:  Kelly Guzman 47 y.o. female returns for a follow up for iron deficiency anemia. Patient was last seen on 05/19/2021. In the interim, patient was lost to follow up.   On exam today Kelly Guzman reports it has been almost 1 year since she last saw [place].  She notes that her myomectomy surgery went well unfortunately she needs another myomectomy surgery and also requires her hemoglobin to be up around 11.  She reports that her heavy menstrual bleeding is starting to cause issues.  She reports that she stopped her iron pills as they were causing cramping and diarrhea.  She has not had bleeding from anywhere else such as nosebleeds, gum bleeding, or dark stools.  She reports that for 3 days her cycles are heavy for a 7-day period.  She notes that she used pads and she goes about 4-5 which are completely soaked on those heavy days.  She is endorsing shortness of breath,  lightheadedness, dizziness, and overall feeling unwell.  She reports that she does occasionally have some tightness in the chest and a feeling like she is "dragging".  Patient denies fevers, chills, night sweats, chest pain or cough. She has no other complaints. A full 10 point ROS is listed below.  MEDICAL HISTORY:  Past Medical History:  Diagnosis Date   Anemia    Heart failure (HCC)    Hypertension     SURGICAL HISTORY: Past Surgical History:  Procedure Laterality Date   CESAREAN SECTION     gave birth to her daughter around 2002    SOCIAL HISTORY: Social History   Socioeconomic History   Marital status: Single    Spouse name: Not on file   Number of children: 1   Years of education: Not on file   Highest education level: Not on file  Occupational History   Not on file  Tobacco Use   Smoking status: Never   Smokeless tobacco: Never  Vaping Use   Vaping Use: Never used  Substance and Sexual Activity   Alcohol use: Never   Drug use: Never   Sexual activity: Not on file  Other Topics Concern   Not on file  Social History Narrative   Not on file   Social Determinants of Health   Financial Resource Strain: High Risk (08/10/2022)   Overall Financial Resource Strain (CARDIA)    Difficulty of Paying Living Expenses: Very hard  Food Insecurity: Food Insecurity Present (08/10/2022)   Hunger Vital Sign  Worried About Programme researcher, broadcasting/film/video in the Last Year: Sometimes true    [employer] of Food in the Last Year: Sometimes true  Transportation Needs: Unmet Transportation Needs (08/10/2022)   PRAPARE - Administrator, Civil Service (Medical): No    Lack of Transportation (Non-Medical): Yes  Physical Activity: Not on file  Stress: Not on file  Social Connections: Not on file  Intimate Partner Violence: Not on file    FAMILY HISTORY: Family History  Problem Relation Age of Onset   Diabetes Mother    Hypertension Mother    Diabetes Father    Heart failure Father     Heart failure Paternal Grandfather     ALLERGIES:  is allergic to penicillins and tape.  MEDICATIONS:  Current Outpatient Medications  Medication Sig Dispense Refill   amLODipine (NORVASC) 5 MG tablet Take 1 tablet (5 mg total) by mouth daily. 90 tablet 3   carvedilol (COREG) 25 MG tablet TAKE 1 TABLET BY MOUTH TWICE DAILY WITH A MEAL 180 tablet 3   furosemide (LASIX) 40 MG tablet Take 0.5 tablets (20 mg total) by mouth daily. 45 tablet 3   Multiple Vitamin (MULTIVITAMIN) capsule Take 1 capsule by mouth daily.     spironolactone (ALDACTONE) 25 MG tablet Take 1 tablet (25 mg total) by mouth daily. 90 tablet 3   valsartan (DIOVAN) 160 MG tablet Take 1 tablet (160 mg total) by mouth daily. 90 tablet 1   No current facility-administered medications for this visit.    REVIEW OF SYSTEMS:   Constitutional: ( - ) fevers, ( - )  chills , ( - ) night sweats Eyes: ( - ) blurriness of vision, ( - ) double vision, ( - ) watery eyes Ears, nose, mouth, throat, and face: ( - ) mucositis, ( - ) sore throat Respiratory: ( - ) cough, ( - ) dyspnea, ( - ) wheezes Cardiovascular: ( - ) palpitation, ( - ) chest discomfort, ( - ) lower extremity swelling Gastrointestinal:  ( - ) nausea, ( - ) heartburn, ( - ) change in bowel habits Skin: ( - ) abnormal skin rashes Lymphatics: ( - ) new lymphadenopathy, ( - ) easy bruising Neurological: ( - ) numbness, ( - ) tingling, ( - ) new weaknesses Behavioral/Psych: ( - ) mood change, ( - ) new changes  All other systems were reviewed with the patient and are negative.  PHYSICAL EXAMINATION:  Vitals:   09/19/22 1457  BP: (!) 175/95  Pulse: 79  Resp: 16  Temp: 97.7 F (36.5 C)  SpO2: 100%    Filed Weights   09/19/22 1457  Weight: 151 lb 11.2 oz (68.8 kg)     GENERAL: well appearing pale African-American female in NAD  SKIN: skin color, texture, turgor are normal, no rashes or significant lesions EYES: conjunctiva are pale and non-injected, sclera  clear LUNGS: clear to auscultation and percussion with normal breathing effort HEART: regular rate & rhythm and no murmurs and no lower extremity edema Musculoskeletal: no cyanosis of digits and no clubbing  PSYCH: alert & oriented x 3, fluent speech NEURO: no focal motor/sensory deficits  LABORATORY DATA:  I have reviewed the data as listed    Latest Ref Rng & Units 09/19/2022    2:36 PM 09/08/2022    3:33 PM 08/10/2022   12:00 AM  CBC  WBC 4.0 - 10.5 K/uL 8.2  8.9  9.6   Hemoglobin 12.0 - 15.0 g/dL 7.1  7.4  7.7   Hematocrit 36.0 - 46.0 % 25.2  26.9  27.7   Platelets 150 - 400 K/uL 535  569  626        Latest Ref Rng & Units 09/19/2022    2:36 PM 09/08/2022    3:33 PM 08/10/2022   12:00 AM  CMP  Glucose 70 - 99 mg/dL 89  91  80   BUN 6 - 20 mg/dL 9  13  13    Creatinine 0.44 - 1.00 mg/dL 1.61  0.96  0.45   Sodium 135 - 145 mmol/L 138  138  139   Potassium 3.5 - 5.1 mmol/L 3.5  3.8  4.1   Chloride 98 - 111 mmol/L 108  107  106   CO2 22 - 32 mmol/L 23  22  23    Calcium 8.9 - 10.3 mg/dL 9.4  9.3  9.2   Total Protein 6.5 - 8.1 g/dL 7.9     Total Bilirubin 0.3 - 1.2 mg/dL 0.4     Alkaline Phos 38 - 126 U/L 44     AST 15 - 41 U/L 15     ALT 0 - 44 U/L 10       RADIOGRAPHIC STUDIES: No results found.  ASSESSMENT & PLAN Kelly Guzman returns for a follow up for iron deficiency anemia.  # Iron Deficiency Anemia 2/2 to GYN Bleeding --Reported black colored stools in April 2022. Negative hemoccult test and she denies any hematochezia or melena at this time.  --No longer taking p.o. iron supplementation as it is causing stomach upset. --Labs today show WBC 8.2, Hgb 7.1, MCV 57.7, Plt 535 --Plan for 1 unit of packed red blood cells on Thursday as well as IV Feraheme 510 mg q. 7 days x 2 doses --Patient underwent myomectomy on 05/26/2021.  Another 1 is to be scheduled once her hemoglobin is persistently above 11 --Hgb and iron levels are persistently low. Will plan to pursue IV  iron therapy.  Of note patient has requested blood transfusions alone but we discussed at length the need for IV iron therapy. --RTC in 4 weeks for labs and 8 weeks for labs/clinic visit to assure IV iron infusion is effective.   No orders of the defined types were placed in this encounter.   All questions were answered. The patient knows to call the clinic with any problems, questions or concerns.  I have spent a total of 30 minutes minutes of face-to-face and non-face-to-face time, preparing to see the patient, performing a medically appropriate examination, counseling and educating the patient, ordering medications/tests,documenting clinical information in the electronic health record, and care coordination.   Ulysees Barns, MD Department of Hematology/Oncology [facility] Phone: [phone] Pager: [phone] Email: [email or web address]   09/20/2022 3:46 PM

## 2022-09-19 ENCOUNTER — Inpatient Hospital Stay: Payer: BLUE CROSS/BLUE SHIELD | Attending: Physician Assistant

## 2022-09-19 ENCOUNTER — Other Ambulatory Visit: Payer: Self-pay | Admitting: *Deleted

## 2022-09-19 ENCOUNTER — Inpatient Hospital Stay (HOSPITAL_BASED_OUTPATIENT_CLINIC_OR_DEPARTMENT_OTHER): Payer: BLUE CROSS/BLUE SHIELD | Admitting: Hematology and Oncology

## 2022-09-19 ENCOUNTER — Other Ambulatory Visit: Payer: Self-pay

## 2022-09-19 ENCOUNTER — Telehealth: Payer: Self-pay | Admitting: Hematology and Oncology

## 2022-09-19 VITALS — BP 175/95 | HR 79 | Temp 97.7°F | Resp 16 | Wt 151.7 lb

## 2022-09-19 DIAGNOSIS — D500 Iron deficiency anemia secondary to blood loss (chronic): Secondary | ICD-10-CM

## 2022-09-19 DIAGNOSIS — N920 Excessive and frequent menstruation with regular cycle: Secondary | ICD-10-CM | POA: Diagnosis not present

## 2022-09-19 DIAGNOSIS — E611 Iron deficiency: Secondary | ICD-10-CM

## 2022-09-19 LAB — CMP (CANCER CENTER ONLY)
ALT: 10 U/L (ref 0–44)
AST: 15 U/L (ref 15–41)
Albumin: 4.5 g/dL (ref 3.5–5.0)
Alkaline Phosphatase: 44 U/L (ref 38–126)
Anion gap: 7 (ref 5–15)
BUN: 9 mg/dL (ref 6–20)
CO2: 23 mmol/L (ref 22–32)
Calcium: 9.4 mg/dL (ref 8.9–10.3)
Chloride: 108 mmol/L (ref 98–111)
Creatinine: 0.71 mg/dL (ref 0.44–1.00)
GFR, Estimated: >60 mL/min (ref >60)
Glucose, Bld: 89 mg/dL (ref 70–99)
Potassium: 3.5 mmol/L (ref 3.5–5.1)
Sodium: 138 mmol/L (ref 135–145)
Total Bilirubin: 0.4 mg/dL (ref 0.3–1.2)
Total Protein: 7.9 g/dL (ref 6.5–8.1)

## 2022-09-19 LAB — CBC WITH DIFFERENTIAL (CANCER CENTER ONLY)
Abs Immature Granulocytes: 0.02 10*3/uL (ref 0.00–0.07)
Basophils Absolute: 0.1 10*3/uL (ref 0.0–0.1)
Basophils Relative: 1 %
Eosinophils Absolute: 0.1 10*3/uL (ref 0.0–0.5)
Eosinophils Relative: 2 %
HCT: 25.2 % — ABNORMAL LOW (ref 36.0–46.0)
Hemoglobin: 7.1 g/dL — ABNORMAL LOW (ref 12.0–15.0)
Immature Granulocytes: 0 %
Lymphocytes Relative: 28 %
Lymphs Abs: 2.3 10*3/uL (ref 0.7–4.0)
MCH: 16.2 pg — ABNORMAL LOW (ref 26.0–34.0)
MCHC: 28.2 g/dL — ABNORMAL LOW (ref 30.0–36.0)
MCV: 57.7 fL — ABNORMAL LOW (ref 80.0–100.0)
Monocytes Absolute: 0.6 10*3/uL (ref 0.1–1.0)
Monocytes Relative: 8 %
Neutro Abs: 5.1 10*3/uL (ref 1.7–7.7)
Neutrophils Relative %: 61 %
Platelet Count: 535 10*3/uL — ABNORMAL HIGH (ref 150–400)
RBC: 4.37 MIL/uL (ref 3.87–5.11)
RDW: 20.3 % — ABNORMAL HIGH (ref 11.5–15.5)
WBC Count: 8.2 10*3/uL (ref 4.0–10.5)
nRBC: 0 % (ref 0.0–0.2)

## 2022-09-19 LAB — IRON AND IRON BINDING CAPACITY (CC-WL,HP ONLY)
Iron: 15 ug/dL — ABNORMAL LOW (ref 28–170)
Saturation Ratios: 3 % — ABNORMAL LOW (ref 10.4–31.8)
TIBC: 608 ug/dL — ABNORMAL HIGH (ref 250–450)
UIBC: 593 ug/dL — ABNORMAL HIGH (ref 148–442)

## 2022-09-19 LAB — SAMPLE TO BLOOD BANK

## 2022-09-20 ENCOUNTER — Encounter: Payer: Self-pay | Admitting: Hematology and Oncology

## 2022-09-20 ENCOUNTER — Other Ambulatory Visit: Payer: Self-pay | Admitting: Hematology and Oncology

## 2022-09-20 LAB — FERRITIN: Ferritin: 2 ng/mL — ABNORMAL LOW (ref 11–307)

## 2022-09-21 ENCOUNTER — Other Ambulatory Visit: Payer: Self-pay | Admitting: Hematology and Oncology

## 2022-09-21 ENCOUNTER — Inpatient Hospital Stay: Payer: BLUE CROSS/BLUE SHIELD

## 2022-09-21 ENCOUNTER — Other Ambulatory Visit: Payer: Self-pay

## 2022-09-21 ENCOUNTER — Other Ambulatory Visit: Payer: Self-pay | Admitting: *Deleted

## 2022-09-21 VITALS — BP 155/87 | HR 72 | Temp 97.8°F | Resp 17

## 2022-09-21 DIAGNOSIS — D500 Iron deficiency anemia secondary to blood loss (chronic): Secondary | ICD-10-CM | POA: Diagnosis not present

## 2022-09-21 DIAGNOSIS — D62 Acute posthemorrhagic anemia: Secondary | ICD-10-CM

## 2022-09-21 LAB — PREPARE RBC (CROSSMATCH)

## 2022-09-21 MED ORDER — SODIUM CHLORIDE 0.9% IV SOLUTION
250.0000 mL | Freq: Once | INTRAVENOUS | Status: AC
  Administered 2022-09-21: 250 mL via INTRAVENOUS

## 2022-09-21 MED ORDER — ACETAMINOPHEN 325 MG PO TABS
650.0000 mg | ORAL_TABLET | Freq: Once | ORAL | Status: AC
  Administered 2022-09-21: 650 mg via ORAL

## 2022-09-21 MED ORDER — SODIUM CHLORIDE 0.9 % IV SOLN: Freq: Once | INTRAVENOUS | Status: AC

## 2022-09-21 MED ORDER — SODIUM CHLORIDE 0.9 % IV SOLN
510.0000 mg | Freq: Once | INTRAVENOUS | Status: AC
  Administered 2022-09-21: 510 mg via INTRAVENOUS
  Filled 2022-09-21: qty 510

## 2022-09-21 MED ORDER — DIPHENHYDRAMINE HCL 25 MG PO CAPS
25.0000 mg | ORAL_CAPSULE | Freq: Once | ORAL | Status: AC
  Administered 2022-09-21: 25 mg via ORAL

## 2022-09-21 NOTE — Patient Instructions (Signed)
Ferumoxytol Injection What is this medication? FERUMOXYTOL (FER ue MOX i tol) treats low levels of iron in your body (iron deficiency anemia). Iron is a mineral that plays an important role in making red blood cells, which carry oxygen from your lungs to the rest of your body. This medicine may be used for other purposes; ask your health care provider or pharmacist if you have questions. COMMON BRAND NAME(S): Feraheme What should I tell my care team before I take this medication? They need to know if you have any of these conditions: Anemia not caused by low iron levels High levels of iron in the blood Magnetic resonance imaging (MRI) test scheduled An unusual or allergic reaction to iron, other medications, foods, dyes, or preservatives Pregnant or trying to get pregnant Breastfeeding How should I use this medication? This medication is injected into a vein. It is given by your care team in a hospital or clinic setting. Talk to your care team the use of this medication in children. Special care may be needed. Overdosage: If you think you have taken too much of this medicine contact a poison control center or emergency room at once. NOTE: This medicine is only for you. Do not share this medicine with others. What if I miss a dose? It is important not to miss your dose. Call your care team if you are unable to keep an appointment. What may interact with this medication? Other iron products This list may not describe all possible interactions. Give your health care provider a list of all the medicines, herbs, non-prescription drugs, or dietary supplements you use. Also tell them if you smoke, drink alcohol, or use illegal drugs. Some items may interact with your medicine. What should I watch for while using this medication? Visit your care team regularly. Tell your care team if your symptoms do not start to get better or if they get worse. You may need blood work done while you are taking this  medication. You may need to follow a special diet. Talk to your care team. Foods that contain iron include: whole grains/cereals, dried fruits, beans, or peas, leafy green vegetables, and organ meats (liver, kidney). What side effects may I notice from receiving this medication? Side effects that you should report to your care team as soon as possible: Allergic reactions--skin rash, itching, hives, swelling of the face, lips, tongue, or throat Low blood pressure--dizziness, feeling faint or lightheaded, blurry vision Shortness of breath Side effects that usually do not require medical attention (report to your care team if they continue or are bothersome): Flushing Headache Joint pain Muscle pain Nausea Pain, redness, or irritation at injection site This list may not describe all possible side effects. Call your doctor for medical advice about side effects. You may report side effects to FDA at 1-800-FDA-1088. Where should I keep my medication? This medication is given in a hospital or clinic and will not be stored at home. NOTE: This sheet is a summary. It may not cover all possible information. If you have questions about this medicine, talk to your doctor, pharmacist, or health care provider.  2024 Elsevier/Gold Standard (2021-10-04 00:00:00)  Blood Transfusion, Adult, Care After The following information offers guidance on how to care for yourself after your procedure. Your health care provider may also give you more specific instructions. If you have problems or questions, contact your health care provider. What can I expect after the procedure? After the procedure, it is common to have: Bruising and soreness  where the IV was inserted. A headache. Follow these instructions at home: IV insertion site care     Follow instructions from your health care provider about how to take care of your IV insertion site. Make sure you: Wash your hands with soap and water for at least 20  seconds before and after you change your bandage (dressing). If soap and water are not available, use hand sanitizer. Change your dressing as told by your health care provider. Check your IV insertion site every day for signs of infection. Check for: Redness, swelling, or pain. Bleeding from the site. Warmth. Pus or a bad smell. General instructions Take over-the-counter and prescription medicines only as told by your health care provider. Rest as told by your health care provider. Return to your normal activities as told by your health care provider. Keep all follow-up visits. Lab tests may need to be done at certain periods to recheck your blood counts. Contact a health care provider if: You have itching or red, swollen areas of skin (hives). You have a fever or chills. You have pain in the head, back, or chest. You feel anxious or you feel weak after doing your normal activities. You have redness, swelling, warmth, or pain around the IV insertion site. You have blood coming from the IV insertion site that does not stop with pressure. You have pus or a bad smell coming from your IV insertion site. If you received your blood transfusion in an outpatient setting, you will be told whom to contact to report any reactions. Get help right away if: You have symptoms of a serious allergic or immune system reaction, including: Trouble breathing or shortness of breath. Swelling of the face, feeling flushed, or widespread rash. Dark urine or blood in the urine. Fast heartbeat. These symptoms may be an emergency. Get help right away. Call 911. Do not wait to see if the symptoms will go away. Do not drive yourself to the hospital. Summary Bruising and soreness around the IV insertion site are common. Check your IV insertion site every day for signs of infection. Rest as told by your health care provider. Return to your normal activities as told by your health care provider. Get help right away  for symptoms of a serious allergic or immune system reaction to the blood transfusion. This information is not intended to replace advice given to you by your health care provider. Make sure you discuss any questions you have with your health care provider. Document Revised: 06/25/2021 Document Reviewed: 06/25/2021 Elsevier Patient Education  2024 [employer].

## 2022-09-22 LAB — TYPE AND SCREEN
ABO/RH(D): A POS
Antibody Screen: NEGATIVE
Unit division: 0

## 2022-09-22 LAB — BPAM RBC
Blood Product Expiration Date: 202407052359
ISSUE DATE / TIME: 202406121139
Unit Type and Rh: 6200

## 2022-09-26 ENCOUNTER — Encounter: Payer: Self-pay | Admitting: Physician Assistant

## 2022-09-26 ENCOUNTER — Ambulatory Visit: Payer: BLUE CROSS/BLUE SHIELD | Attending: Physician Assistant | Admitting: Physician Assistant

## 2022-09-26 VITALS — BP 198/112 | HR 78 | Ht 60.0 in | Wt 150.0 lb

## 2022-09-26 DIAGNOSIS — I10 Essential (primary) hypertension: Secondary | ICD-10-CM

## 2022-09-26 MED ORDER — VALSARTAN 160 MG PO TABS: 160.0000 mg | ORAL_TABLET | Freq: Every day | ORAL | 1 refills | Status: DC

## 2022-09-26 MED ORDER — CARVEDILOL 25 MG PO TABS: 25.0000 mg | ORAL_TABLET | Freq: Two times a day (BID) | ORAL | 1 refills | Status: DC

## 2022-09-26 MED ORDER — AMLODIPINE BESYLATE 5 MG PO TABS: 5.0000 mg | ORAL_TABLET | Freq: Every day | ORAL | 1 refills | Status: DC

## 2022-09-26 MED ORDER — SPIRONOLACTONE 25 MG PO TABS
25.0000 mg | ORAL_TABLET | Freq: Every day | ORAL | 1 refills | Status: DC
Start: 2022-09-26 — End: 2023-01-30

## 2022-09-26 NOTE — Progress Notes (Signed)
Cardiology Office Note:    Date:  09/26/2022  ID:  Kelly Guzman, DOB 12/29/1975, MRN [id number] PCP: Fleet Contras, MD  [facility] Providers Cardiologist:  Kristeen Miss, MD        Patient Profile:      HFimpEF (heart failure with improved ejection fraction)   Admx with acute systolic HF in 2022 in setting of profound anemia TTE 07/14/20: EF 35-40, mild to mod MR, mod to severe TR TTE 11/04/20: EF 50-55 TTE 09/09/2021: EF 60-65, no RWMA, GR 1 DD, normal RVSF, normal PASP, mild LAE, mild MR, RAP 3 Hypertension  Fe deficiency anemia        History of Present Illness:   Kelly Guzman is a 47 y.o. female who returns for follow-up on hypertension.  She was last seen 08/31/2022.  She had not yet started amlodipine or furosemide.  She was due to pick those up from the pharmacy that day.  I changed her losartan to valsartan.  She is here alone.  Her blood pressure remains uncontrolled.  She has not picked up valsartan or amlodipine.  Our social worker has tried to help her several times with medications.  She has been advised that she can get her co-pay waived at certain pharmacies.  She has difficulty with transportation.  She has also been advised that her medications can be delivered.  She has not had chest pain, significant shortness of breath, swelling, syncope, headaches, symptoms consistent with TIA or stroke. Of note, she has recently been set up for transfusion with PRBCs and Iron infusion b/c of her worsening anemia.   ROS see the HPI    Studies Reviewed:    EKG:  not done  Risk Assessment/Calculations:     HYPERTENSION CONTROL Vitals:   09/26/22 1340 09/26/22 1650 09/26/22 1651  BP: (!) 180/102 (!) 204/114 (!) 198/112    The patient's blood pressure is elevated above target today.  In order to address the patient's elevated BP: A new medication was prescribed today.          Physical Exam:   VS:  BP (!) 198/112 (BP Location: Left Arm)   Pulse 78   Ht 5'  (1.524 m)   Wt 150 lb (68 kg)   SpO2 98%   BMI 29.29 kg/m    Wt Readings from Last 3 Encounters:  09/26/22 150 lb (68 kg)  09/19/22 151 lb 11.2 oz (68.8 kg)  08/31/22 152 lb 6.4 oz (69.1 kg)    Constitutional:      Appearance: Healthy appearance. Not in distress.  Neck:     Vascular: JVD normal.  Pulmonary:     Breath sounds: No rales.  Cardiovascular:     Normal rate. Regular rhythm.     Murmurs: There is no murmur.  Edema:    Peripheral edema absent.  Abdominal:     Palpations: Abdomen is soft.       ASSESSMENT AND PLAN:   Essential hypertension BP remains markedly uncontrolled. She has only taken 1 dose of Coreg 25 mg today. She has not taken Spironolactone or Losartan. She was started on Amlodipine some time ago. But she has not started it. She has difficulty with transportation and notes that she cannot afford the $4 copay. As noted, our social worker has reached out to her in the past and has tried to help with advice regarding her medications. I reviewed this again today with the social worker. The patient can get her copay waived at certain  pharmacies and she can get the medications delivered. She did have BCBS show up recently and we advised her that this may cause Medicaid to decline. If she does not intend to keep this, she will have to cancel it to make sure she does not lose coverage from Medicaid. Her medications were called into [facility] today with plans to deliver them to her where she is currently staying. We did discuss the dangers of not managing her BP which include stroke, heart failure, kidney failure leading to dialysis, aortic dissection, death.  BMET today Continue Coreg 25 mg twice daily, spironolactone 25 mg daily Stop losartan Start valsartan 160 mg daily Start amlodipine 5 mg daily Follow-up 1 month     Dispo:  Return in about 4 weeks (around 10/24/2022) for Routine Follow Up, w/ Tereso Newcomer, PA-C.  Signed, Tereso Newcomer, PA-C

## 2022-09-26 NOTE — Patient Instructions (Addendum)
Medication Instructions:  Your physician has recommended you make the following change in your medication:  STOP LOSARTAN RESTART VALSARTAN 160 TAKING 1 DAILY  RESTART AMLODIPINE 5 MG TAKING 1 DAILY  CONTINUE SPIRONOLACTONE 25 MG TAKING 1 DAILY CONTINUE CARVEDILOL 25 MG TAKING 1 TWICE A DAY  *If you need a refill on your cardiac medications before your next appointment, please call your pharmacy*   Lab Work: None ordered  If you have labs (blood work) drawn today and your tests are completely normal, you will receive your results only by: MyChart Message (if you have MyChart) OR A paper copy in the mail If you have any lab test that is abnormal or we need to change your treatment, we will call you to review the results.   Testing/Procedures: None ordered   Follow-Up: At [facility], you and your health needs are our priority.  As part of our continuing mission to provide you with exceptional heart care, we have created designated Provider Care Teams.  These Care Teams include your primary Cardiologist (physician) and Advanced Practice Providers (APPs -  Physician Assistants and Nurse Practitioners) who all work together to provide you with the care you need, when you need it.  We recommend signing up for the patient portal called "MyChart".  Sign up information is provided on this After Visit Summary.  MyChart is used to connect with patients for Virtual Visits (Telemedicine).  Patients are able to view lab/test results, encounter notes, upcoming appointments, etc.  Non-urgent messages can be sent to your provider as well.   To learn more about what you can do with MyChart, go to [email or web address].    Your next appointment:   1 month(s)  Provider:   Tereso Newcomer, PA-C         Other Instructions

## 2022-09-26 NOTE — Assessment & Plan Note (Addendum)
BP remains markedly uncontrolled. She has only taken 1 dose of Coreg 25 mg today. She has not taken Spironolactone or Losartan. She was started on Amlodipine some time ago. But she has not started it. She has difficulty with transportation and notes that she cannot afford the $4 copay. As noted, our social worker has reached out to her in the past and has tried to help with advice regarding her medications. I reviewed this again today with the social worker. The patient can get her copay waived at certain pharmacies and she can get the medications delivered. She did have BCBS show up recently and we advised her that this may cause Medicaid to decline. If she does not intend to keep this, she will have to cancel it to make sure she does not lose coverage from Medicaid. Her medications were called into [facility] today with plans to deliver them to her where she is currently staying. We did discuss the dangers of not managing her BP which include stroke, heart failure, kidney failure leading to dialysis, aortic dissection, death.  BMET today Continue Coreg 25 mg twice daily, spironolactone 25 mg daily Stop losartan Start valsartan 160 mg daily Start amlodipine 5 mg daily Follow-up 1 month

## 2022-09-29 ENCOUNTER — Inpatient Hospital Stay: Payer: BLUE CROSS/BLUE SHIELD

## 2022-09-29 ENCOUNTER — Other Ambulatory Visit: Payer: Self-pay

## 2022-09-29 ENCOUNTER — Encounter: Payer: Self-pay | Admitting: Hematology and Oncology

## 2022-09-29 ENCOUNTER — Telehealth: Payer: Self-pay | Admitting: Physician Assistant

## 2022-09-29 ENCOUNTER — Other Ambulatory Visit: Payer: Self-pay | Admitting: Hematology and Oncology

## 2022-09-29 DIAGNOSIS — D500 Iron deficiency anemia secondary to blood loss (chronic): Secondary | ICD-10-CM

## 2022-09-29 NOTE — Telephone Encounter (Signed)
H&V Care Navigation CSW Progress Note  Clinical Social Worker  received call back from pt  to let me know that they aren't able to do car delivery but have sent her medications in the mail and they may take max 3 days to get to her, she is wondering what to do. I explained since they have been filled and mailed we aren't going to be able to pay for a partial fill of them and that they are all generics so we do not have samples. I will route this to provider for recommendations but mentioned that she needs to start taking them when they get to her. Reviewed list with Thayer Ohm, PharmD, no suggestions on the in between period, recommended to take them when received.   Patient is participating in a Managed Medicaid Plan:  Yes-[employer]  SDOH Screenings   Food Insecurity: Food Insecurity Present (08/10/2022)  Housing: High Risk (08/10/2022)  Transportation Needs: Unmet Transportation Needs (08/10/2022)  Utilities: Not At Risk (08/10/2022)  Financial Resource Strain: High Risk (08/10/2022)  Tobacco Use: Low Risk  (09/26/2022)   Octavio Graves, MSW, LCSW Clinical Social Worker II [facility] Heart/Vascular Care Navigation  [phone]- work cell phone (preferred) [phone]- desk phone

## 2022-09-29 NOTE — Telephone Encounter (Addendum)
Spoke to the pt, during her last OV with Tereso Newcomer, PA-C there was a discussion pertaining to the pt having her co-pay waived. She stated someone advised her that our office will contact her on 6/18 with contact information  for co-pay assistance. However, no has called the pt. Per Tereso Newcomer, PA-C  notes from the last office visit:      As noted, our social worker has reached out to her in the past and has tried to help with advice regarding her medications. I reviewed this again today with the social worker. The patient can get her copay waived at certain pharmacies and she can get the medications delivered. She did have BCBS show up recently and we advised her that this may cause Medicaid to decline. If she does not intend to keep this, she will have to cancel it to make sure she does not lose coverage from Medicaid. Her medications were called into [facility] today with plans to deliver them to her where she is currently staying.   Explained to the pt message above, pt has not contacted social work. Provide her with social worker contact information. Pt voiced understanding.

## 2022-09-29 NOTE — Telephone Encounter (Signed)
Agree. She has been w/o for this long. Getting them is the most important thing.  Thank you for all you help! Tereso Newcomer, PA-C    09/29/2022 3:28 PM

## 2022-09-29 NOTE — Telephone Encounter (Signed)
Pt states at her last appt due and Scott discussed her medication copay fees being waived. Please advise.

## 2022-09-29 NOTE — Telephone Encounter (Signed)
H&V Care Navigation CSW Progress Note  Clinical Social Worker  received a call from pt  to clarify what was discussed during appt and with triage LPN. Confirmed pt medications were to be sent to [facility] and they are able to waive co-pays. Provided pharmacy phone number to f/u on medications. She has called BCBS and cancelled commercial plan, encouraged her to look for any mail and contact DSS to have it removed from her N[place] to prevent any issues with processing/waiver. Encouraged her to contact me back if any issues with waiver etc and to call triage line if medications had not been accurately sent to pharmacy.   Patient is participating in a Managed Medicaid Plan:  Yes- [employer]  SDOH Screenings   Food Insecurity: Food Insecurity Present (08/10/2022)  Housing: High Risk (08/10/2022)  Transportation Needs: Unmet Transportation Needs (08/10/2022)  Utilities: Not At Risk (08/10/2022)  Financial Resource Strain: High Risk (08/10/2022)  Tobacco Use: Low Risk  (09/26/2022)   Octavio Graves, MSW, LCSW Clinical Social Worker II [facility] Heart/Vascular Care Navigation  [phone]- work cell phone (preferred) [phone]- desk phone

## 2022-10-26 ENCOUNTER — Ambulatory Visit: Payer: Medicaid Other | Attending: Physician Assistant | Admitting: Physician Assistant

## 2022-10-26 ENCOUNTER — Encounter: Payer: Self-pay | Admitting: Physician Assistant

## 2022-10-26 NOTE — Progress Notes (Deleted)
  Cardiology Office Note:    Date:  10/26/2022  ID:  Kelly Guzman, DOB [birth year], MRN [id number] PCP: Fleet Contras, MD  [facility] Providers Cardiologist:  Kristeen Miss, MD { Click to update primary MD,subspecialty MD or APP then REFRESH:1}    {Click to Open Review  :1}   Patient Profile:     *** HFimpEF (heart failure with improved ejection fraction)   Admx with acute systolic HF in 2022 in setting of profound anemia TTE 07/14/20: EF 35-40, mild to mod MR, mod to severe TR TTE 11/04/20: EF 50-55 TTE 09/09/2021: EF 60-65, no RWMA, GR 1 DD, normal RVSF, normal PASP, mild LAE, mild MR, RAP 3 Hypertension  Fe deficiency anemia          {Blood pressure difficult to control due to STO H last seen 09/26/2022    :1}     Discussed the use of AI scribe software for clinical note transcription with the patient, who gave verbal consent to proceed.  History of Present Illness          ROS: ***    Studies Reviewed:       *** Risk Assessment/Calculations:   {Does this patient have ATRIAL FIBRILLATION?:[phone]} No BP recorded.  {Refresh Note OR Click here to enter BP  :1}***       Physical Exam:   VS:  There were no vitals taken for this visit.   Wt Readings from Last 3 Encounters:  09/26/22 150 lb (68 kg)  09/19/22 151 lb 11.2 oz (68.8 kg)  08/31/22 152 lb 6.4 oz (69.1 kg)    Physical Exam             Assessment and Plan           { Essential hypertension BP remains markedly uncontrolled. She has only taken 1 dose of Coreg 25 mg today. She has not taken Spironolactone or Losartan. She was started on Amlodipine some time ago. But she has not started it. She has difficulty with transportation and notes that she cannot afford the $4 copay. As noted, our social worker has reached out to her in the past and has tried to help with advice regarding her medications. I reviewed this again today with the social worker. The patient can get her copay waived at certain  pharmacies and she can get the medications delivered. She did have BCBS show up recently and we advised her that this may cause Medicaid to decline. If she does not intend to keep this, she will have to cancel it to make sure she does not lose coverage from Medicaid. Her medications were called into [facility] today with plans to deliver them to her where she is currently staying. We did discuss the dangers of not managing her BP which include stroke, heart failure, kidney failure leading to dialysis, aortic dissection, death.  BMET today Continue Coreg 25 mg twice daily, spironolactone 25 mg daily Stop losartan Start valsartan 160 mg daily Start amlodipine 5 mg daily Follow-up 1 month     :1}    {Are you ordering a CV Procedure (e.g. stress test, cath, DCCV, TEE, etc)?   Press F2        :[id number]}  Dispo:  No follow-ups on file.  Signed, Tereso Newcomer, PA-C

## 2022-10-31 ENCOUNTER — Telehealth: Payer: Self-pay | Admitting: Licensed Clinical Social Worker

## 2022-10-31 NOTE — Telephone Encounter (Signed)
H&V Care Navigation CSW Progress Note  Clinical Social Worker  received call from pt  to f/u on [employer] number. Provided her with such and the [facility] information managed by [facility] as well in case she experiences any issues with [facility]'s mail service.   Patient is participating in a Managed Medicaid Plan:  Yes  SDOH Screenings   Food Insecurity: Food Insecurity Present (08/10/2022)  Housing: High Risk (08/10/2022)  Transportation Needs: Unmet Transportation Needs (08/10/2022)  Utilities: Not At Risk (08/10/2022)  Financial Resource Strain: High Risk (08/10/2022)  Tobacco Use: Low Risk  (10/06/2022)   Received from [facility]   Octavio Graves, MSW, LCSW Clinical Social Worker II [facility] Heart/Vascular Care Navigation  [phone]- work cell phone (preferred) [phone]- desk phone

## 2022-11-28 ENCOUNTER — Encounter: Payer: Self-pay | Admitting: Hematology and Oncology

## 2022-12-05 ENCOUNTER — Other Ambulatory Visit: Payer: Self-pay | Admitting: Physician Assistant

## 2022-12-23 ENCOUNTER — Encounter: Payer: Self-pay | Admitting: Hematology and Oncology

## 2023-01-02 ENCOUNTER — Ambulatory Visit: Payer: Medicaid Other | Admitting: Physician Assistant

## 2023-01-02 NOTE — Progress Notes (Deleted)
Cardiology Office Note:    Date:  01/02/2023  ID:  Marcus Santin, DOB 1975/07/21, MRN [id number] PCP: Fleet Contras, MD  [facility] Providers Cardiologist:  Kristeen Miss, MD { Click to update primary MD,subspecialty MD or APP then REFRESH:1}    {Click to Open Review  :1}   Patient Profile:     *** HFimpEF (heart failure with improved ejection fraction)   Admx with acute systolic HF in 2022 in setting of profound anemia TTE 07/14/20: EF 35-40, mild to mod MR, mod to severe TR TTE 11/04/20: EF 50-55 TTE 09/09/2021: EF 60-65, no RWMA, GR 1 DD, normal RVSF, normal PASP, mild LAE, mild MR, RAP 3 Hypertension  Fe deficiency anemia          {      :1}   History of Present Illness:  Discussed the use of AI scribe software for clinical note transcription with the patient, who gave verbal consent to proceed.   Kelly Guzman is a 47 y.o. female who returns for follow-up of hypertension, CHF.  She was last seen in June 2024.  Her blood pressure has been uncontrolled.  She has been prescribed multiple medications.  At last visit, she had not picked up her medications yet and her blood pressure remained elevated.  She has been contacted several times by our social worker to help with medications.  She has been advised several times that her co-pay can be waived at several pharmacies.             ROS: ***    Studies Reviewed:       *** Risk Assessment/Calculations:   {Does this patient have ATRIAL FIBRILLATION?:[id number]} No BP recorded.  {Refresh Note OR Click here to enter BP  :1}***       Physical Exam:   VS:  There were no vitals taken for this visit.   Wt Readings from Last 3 Encounters:  09/26/22 150 lb (68 kg)  09/19/22 151 lb 11.2 oz (68.8 kg)  08/31/22 152 lb 6.4 oz (69.1 kg)    Physical Exam***     Assessment and Plan:  No problem-specific Assessment & Plan notes found for this encounter. Assessment and Plan             *** {  Essential  hypertension BP remains markedly uncontrolled. She has only taken 1 dose of Coreg 25 mg today. She has not taken Spironolactone or Losartan. She was started on Amlodipine some time ago. But she has not started it. She has difficulty with transportation and notes that she cannot afford the $4 copay. As noted, our social worker has reached out to her in the past and has tried to help with advice regarding her medications. I reviewed this again today with the social worker. The patient can get her copay waived at certain pharmacies and she can get the medications delivered. She did have [employer]BS show up recently and we advised her that this may cause Medicaid to decline. If she does not intend to keep this, she will have to cancel it to make sure she does not lose coverage from Medicaid. Her medications were called into [facility] today with plans to deliver them to her where she is currently staying. We did discuss the dangers of not managing her BP which include stroke, heart failure, kidney failure leading to dialysis, aortic dissection, death.  BMET today Continue Coreg 25 mg twice daily, spironolactone 25 mg daily Stop losartan Start valsartan 160 mg daily  Start amlodipine 5 mg daily Follow-up 1 month    :1}    {Are you ordering a CV Procedure (e.g. stress test, cath, DCCV, TEE, etc)?   Press F2        :[id number]}  Dispo:  No follow-ups on file.  Signed, Tereso Newcomer, PA-C

## 2023-01-23 ENCOUNTER — Other Ambulatory Visit: Payer: Self-pay | Admitting: Internal Medicine

## 2023-01-23 DIAGNOSIS — Z1231 Encounter for screening mammogram for malignant neoplasm of breast: Secondary | ICD-10-CM

## 2023-01-30 ENCOUNTER — Ambulatory Visit: Payer: Medicaid Other | Attending: Physician Assistant | Admitting: Physician Assistant

## 2023-01-30 ENCOUNTER — Encounter: Payer: Self-pay | Admitting: Physician Assistant

## 2023-01-30 DIAGNOSIS — I10 Essential (primary) hypertension: Secondary | ICD-10-CM | POA: Diagnosis not present

## 2023-01-30 MED ORDER — VALSARTAN 160 MG PO TABS: 160.0000 mg | ORAL_TABLET | Freq: Every day | ORAL | 3 refills | Status: DC

## 2023-01-30 MED ORDER — SPIRONOLACTONE 25 MG PO TABS
25.0000 mg | ORAL_TABLET | Freq: Every day | ORAL | 3 refills | Status: DC
Start: 2023-01-30 — End: 2024-02-13

## 2023-01-30 MED ORDER — AMLODIPINE BESYLATE 5 MG PO TABS: 5.0000 mg | ORAL_TABLET | Freq: Every day | ORAL | 3 refills | Status: DC

## 2023-01-30 MED ORDER — CARVEDILOL 25 MG PO TABS: 25.0000 mg | ORAL_TABLET | Freq: Two times a day (BID) | ORAL | 3 refills | Status: DC

## 2023-01-30 NOTE — Patient Instructions (Signed)
Medication Instructions:  Your physician recommends that you continue on your current medications as directed. Please refer to the Current Medication list given to you today.  *If you need a refill on your cardiac medications before your next appointment, please call your pharmacy*   Lab Work: TODAY:  BMET   If you have labs (blood work) drawn today and your tests are completely normal, you will receive your results only by: MyChart Message (if you have MyChart) OR A paper copy in the mail If you have any lab test that is abnormal or we need to change your treatment, we will call you to review the results.   Testing/Procedures: None ordered   Follow-Up: At [facility], you and your health needs are our priority.  As part of our continuing mission to provide you with exceptional heart care, we have created designated Provider Care Teams.  These Care Teams include your primary Cardiologist (physician) and Advanced Practice Providers (APPs -  Physician Assistants and Nurse Practitioners) who all work together to provide you with the care you need, when you need it.  We recommend signing up for the patient portal called "MyChart".  Sign up information is provided on this After Visit Summary.  MyChart is used to connect with patients for Virtual Visits (Telemedicine).  Patients are able to view lab/test results, encounter notes, upcoming appointments, etc.  Non-urgent messages can be sent to your provider as well.   To learn more about what you can do with MyChart, go to [email or web address].    Your next appointment:   6 month(s)  Provider:   Kristeen Miss, MD  or Tereso Newcomer, PA-C         Other Instructions

## 2023-01-30 NOTE — Progress Notes (Addendum)
Cardiology Office Note:    Date:  01/30/2023  ID:  Kelly Guzman, DOB 10-Jul-1975, MRN [id number] PCP: Kelly Contras, MD  [facility] Providers Cardiologist:  Kelly Miss, MD       Patient Profile:      HFimpEF (heart failure with improved ejection fraction)   Admx with acute systolic HF in 2022 in setting of profound anemia TTE 07/14/20: EF 35-40, mild to mod MR, mod to severe TR TTE 11/04/20: EF 50-55 TTE 09/09/2021: EF 60-65, no RWMA, GR 1 DD, normal RVSF, normal PASP, mild LAE, mild MR, RAP 3 Hypertension  Fe deficiency anemia            History of Present Illness:  Discussed the use of AI scribe software for clinical note transcription with the patient. She asked me to not use. Therefore, AI scribe software was NOT used during this visit.   Kelly Guzman is a 47 y.o. female who returns for follow-up of hypertension, CHF.  She was last seen in June 2024.  Her blood pressure has been uncontrolled.  She has been prescribed multiple medications.  At last visit, she had not picked up her medications yet and her blood pressure remained elevated.  She has been contacted several times by our social worker to help with medications.  She has been advised several times that her co-pay can be waived at several pharmacies.  She is here alone.  She is taking all the medications as prescribed.  She has not had chest pain, syncope, orthopnea, significant leg edema.  She has occasional edema in her left ankle.  She is also had some left calf pain from time to time with certain movements.  She has not had any injuries.  She does note blurry vision from time to time.  She has had her vision checked with optometry.  She has not documented any hypotensive episodes.       Review of Systems  Eyes:  Positive for blurred vision.    See HPI     Studies Reviewed:         Risk Assessment/Calculations:             Physical Exam:   VS:  BP 116/72   Pulse 72   Ht 5' (1.524 m)   Wt 152  lb 9.6 oz (69.2 kg)   SpO2 99%   BMI 29.80 kg/m    Wt Readings from Last 3 Encounters:  01/30/23 152 lb 9.6 oz (69.2 kg)  09/26/22 150 lb (68 kg)  09/19/22 151 lb 11.2 oz (68.8 kg)    Constitutional:      Appearance: Healthy appearance. Not in distress.  Pulmonary:     Breath sounds: Normal breath sounds. No wheezing. No rales.  Cardiovascular:     Normal rate. Regular rhythm.     Murmurs: There is no murmur.  Edema:    Peripheral edema absent.        Assessment and Plan:   Assessment & Plan Primary hypertension Blood pressure is now controlled.  Repeat by me 130/84.  She remains on amlodipine 5 mg daily carvedilol 25 mg twice daily, spironolactone 25 mg daily valsartan 160 mg daily.  No changes will be made today.  She does note occasional dizziness.  I have asked her to contact us if she documents systolic blood pressure less than 100.  Obtain follow-up BMET today.  Follow-up 6 months.       Dispo:  Return in about 6 months (around 07/31/2023)  for w/ Dr. Elease Hashimoto.  Signed, Tereso Newcomer, PA-C

## 2023-01-31 LAB — BASIC METABOLIC PANEL
BUN/Creatinine Ratio: 19 (ref 9–23)
BUN: 18 mg/dL (ref 6–24)
CO2: 23 mmol/L (ref 20–29)
Calcium: 10 mg/dL (ref 8.7–10.2)
Chloride: 103 mmol/L (ref 96–106)
Creatinine, Ser: 0.95 mg/dL (ref 0.57–1.00)
Glucose: 86 mg/dL (ref 70–99)
Potassium: 4 mmol/L (ref 3.5–5.2)
Sodium: 143 mmol/L (ref 134–144)
eGFR: 74 mL/min/{1.73_m2} (ref >59)

## 2023-02-21 ENCOUNTER — Ambulatory Visit
Admission: RE | Admit: 2023-02-21 | Discharge: 2023-02-21 | Disposition: A | Discharge status: Home / Self Care | Payer: Medicaid Other | Source: Ambulatory Visit | Attending: Internal Medicine | Admitting: Internal Medicine

## 2023-02-21 DIAGNOSIS — Z1231 Encounter for screening mammogram for malignant neoplasm of breast: Secondary | ICD-10-CM

## 2023-07-02 NOTE — ED Provider Notes (Signed)
 Emergency Department Provider Note   History   Chief Complaint  Patient presents with  . Leg Swelling      History provided by:  Patient Illness Location:  Bilateral legs Quality:  Swelling Severity:  Moderate Onset quality:  Gradual Duration:  3 days Timing:  Constant Progression:  Worsening Chronicity:  Recurrent Context:  Pt has been on feet a lot as she hasn't had stable housing over the last few days Relieved by:  Nothing Worsened by:  Being on feet Associated symptoms: no chest pain and no shortness of breath      Past Medical History Past Medical History:  Diagnosis Date  . Acute on chronic combined systolic and diastolic CHF (congestive heart failure) (HCC) 04/28/2021  . Anemia   . CHD (coronary heart disease)   . Fibroids   . Hypertension      Past Surgical History Past Surgical History:  Procedure Laterality Date  . CESAREAN SECTION, UNSPECIFIED     Procedure: CESAREAN SECTION  . HYSTEROSCOPY N/A 12/27/2022   HYSTEROSCOPY WITH ENDOMETRIAL SAMPLING performed by Hart Izetta Paddy, MD at [facility] L&D OR  . MYOMECTOMY VAGINAL APPROACH N/A 05/26/2021   Procedure: MYOMECTOMY HYSTEROSCOPIC AND EUA (EXAM UNDER ANESTHESIA ;  Surgeon: Hart Izetta Paddy, MD;  Location: [facility] OB OR;  Service: Gynecology;  Laterality: N/A;  . RADIOFREQUENCY ABLATION N/A 12/27/2022   ABLATION RADIOFREQUENCY--SONATA performed by Hart Izetta Paddy, MD at [facility] L&D OR      Medications Current Outpatient Medications  Medication Instructions  . amLODIPine  (NORVASC ) 5 mg, oral, Daily  . ASHWAGANDHA EXTRACT ORAL 1 Dose, oral, At bedtime, vitafusion  . carvediloL  (COREG ) 25 mg, oral, 2 times daily with meals  . furosemide  (LASIX ) 20 mg, oral, Daily  . multivitamin (THERAGRAN) tab tablet 1 tablet, oral, Daily  . potassium chloride  20 mEq ER tablet 20 mEq, oral, 2 times daily  . spironolactone  (ALDACTONE ) 25 mg, oral, Daily  . valsartan  (DIOVAN ) 160 mg, oral, Daily       Allergies Allergies  Allergen Reactions  . Adhesive Rash  . Penicillins Rash     Family History No family history on file.   Social History Social History   Socioeconomic History  . Marital status: Single    Spouse name: Not on file  . Number of children: Not on file  . Years of education: Not on file  . Highest education level: Not on file  Occupational History  . Not on file  Tobacco Use  . Smoking status: Never  . Smokeless tobacco: Never  Substance and Sexual Activity  . Alcohol use: Not Currently  . Drug use: Never  . Sexual activity: Not on file  Other Topics Concern  . Not on file  Social History Narrative  . Not on file   Social Drivers of Health   Food Insecurity: Food Insecurity Present (08/10/2022)   Received from [facility]   Food vital sign   . Within the past 12 months, you worried that your food would run out before you got money to buy more: Sometimes true   . Within the past 12 months, the food you bought just didn't last and you didn't have money to get more: Sometimes true  Transportation Needs: Unmet Transportation Needs (08/10/2022)   Received from [facility] - Transportation   . Lack of Transportation (Medical): No   . Lack of Transportation (Non-Medical): Yes  Safety: Not on file  Living Situation: Not on file  Review of Systems  Review of Systems  Respiratory:  Negative for shortness of breath.   Cardiovascular:  Positive for leg swelling. Negative for chest pain.  Neurological:  Positive for dizziness.     Physical Exam   ED Triage Vitals [07/02/23 2122]  Temp 97.8 F (36.6 C)  Heart Rate 91  Resp 16  BP (!) 179/95  MAP (mmHg) 123  SpO2 100 %  O2 Device None (Room air)  O2 Flow Rate (L/min)   Weight 76.2 kg (168 lb)    Physical Exam Constitutional:      General: She is not in acute distress.    Appearance: Normal appearance.  HENT:     Head: Atraumatic.     Mouth/Throat:     Mouth: Mucous  membranes are moist.  Eyes:     Extraocular Movements: Extraocular movements intact.  Cardiovascular:     Rate and Rhythm: Normal rate and regular rhythm.  Pulmonary:     Effort: Pulmonary effort is normal. No respiratory distress.  Abdominal:     General: Abdomen is flat. There is no distension.  Musculoskeletal:        General: No deformity.     Cervical back: Normal range of motion.     Right lower leg: Edema present.     Left lower leg: Edema present.  Skin:    General: Skin is warm and dry.  Neurological:     Mental Status: She is alert and oriented to person, place, and time. Mental status is at baseline.  Psychiatric:        Mood and Affect: Mood normal.     Labs   Abnormal Labs Reviewed  CBC WITH DIFFERENTIAL - Abnormal; Notable for the following components:      Result Value   WBC 13.42 (*)    Hemoglobin 11.0 (*)    Hematocrit 34.1 (*)    Mean Corpuscular Volume (MCV) 72.4 (*)    Mean Corpuscular Hemoglobin (MCH) 23.3 (*)    Mean Corpuscular Hemoglobin Conc (MCHC) 32.1 (*)    Red Cell Distribution Width (RDW) 17.2 (*)    Platelet Count (PLT) 453 (*)    Neutrophils Absolute 8.70 (*)    Monocytes # 0.90 (*)    All other components within normal limits    Labs independently reviewed by myself and considered in medical decision making.  Radiology   XR Foot Minimum 3 Views Left  Final Result by Delmar Herbert Winfred Booker Results In [place] [id number] (03/23 2314)  CLINICAL DATA:  Fall    EXAM:  LEFT FOOT - COMPLETE 3+ VIEW    COMPARISON:  None Available.    FINDINGS:  There is no evidence of fracture or dislocation. There is no  evidence of arthropathy or other focal bone abnormality. Soft  tissues are unremarkable.    IMPRESSION:  Negative.      Electronically Signed    By: Luke Bun M.D.    On: 07/02/2023 23:14      XR Chest 2 Views  Final Result by Kate Almarie Plummer, MD (03/23 2152)  CLINICAL DATA:  Shortness of Breath    EXAM:  CHEST - 2 VIEW     COMPARISON:  Chest x-ray 07/22/2020.    FINDINGS:  The heart and mediastinal contours are within normal limits.    No focal consolidation. No pulmonary edema. No pleural effusion. No  pneumothorax.    No acute osseous abnormality.    IMPRESSION:  No active cardiopulmonary disease.  Electronically Signed    By: Morgane  Naveau M.D.    On: 07/02/2023 21:52        Imaging independently reviewed by myself and considered in medical decision making. Imaging final read interpreted by radiology.  Procedure Note  Procedures  Medical Decision Making   Medical Decision Making Patient is a very pleasant 48 year old female with history as above presents for evaluation of leg swelling.  She states she has been on her feet for several days and her leg swelling gets worse when she is on her feet.  She is compliant with her medications.  She is currently having some housing instability.  She denies any significant chest pain, shortness of breath.  On exam, she overall appears well.  She has diffuse edema throughout bilateral lower extremities.  Differential diagnosis includes dependent edema, CHF exacerbation, renal failure, nephrotic syndrome, other volume overload.  Labs obtained which are reassuring with normal troponin, normal BNP.  Chest x-ray shows no evidence of volume overload in her chest.  She also reports some left foot pain from a remote fall.  X-ray obtained to evaluate for fracture or dislocation.  The left foot x-ray is negative.  I suspect her symptoms are primarily from dependent edema.  However, she may benefit from a few days of additional diuresis.  I will prescribe some Lasix  and potassium.  She appears stable for discharge at this time.  Plan to follow-up with PCP.  Return precautions discussed in detail.  Problems Addressed: Hypertension, unspecified type: complicated acute illness or injury Leg swelling: complicated acute illness or injury  Amount and/or  Complexity of Data Reviewed Labs: ordered. Radiology: ordered. ECG/medicine tests: ordered.  Risk Prescription drug management.     ED Clinical Impression   1. Leg swelling   2. Hypertension, unspecified type      ED Disposition     ED Disposition  Discharge   Condition  Stable   Comment  --           Discharge Medication List as of 07/02/2023 11:44 PM     START taking these medications   Details  furosemide  (LASIX ) 20 mg tablet Take 1 tablet (20 mg total) by mouth daily for 3 days., Starting Sun 07/02/2023, Until Wed 07/05/2023, Print    potassium chloride  20 mEq ER tablet Take 1 tablet (20 mEq total) by mouth 2 (two) times a day for 3 days., Starting Sun 07/02/2023, Until Wed 07/05/2023, Print          FOLLOW UP Aliene DELENA Colon, MD [place] [address] [phone]  Schedule an appointment as soon as possible for a visit    [facility] -  EMERGENCY DEPARTMENT [address] [employer] [place]  [phone]  As needed, If symptoms worsen

## 2023-07-04 NOTE — ED Provider Notes (Addendum)
 Patient placed in First Look pathway, seen and evaluated for chief complaint of bilateral leg and foot swelling x 4 days. Hx of CHF, does not take a diueric. Also with dyspnea on exertion. Denies chest pain  Pertinent exam findings include nontoxic appearing, NAD.   Patient/parent counseled on process, plan, and necessity for staying for completing the evaluation.   Note By: Metta Lines, PA-C 6:55 PM   [facility] Emergency Department Emergency Department Provider Note  This document was created using the aid of voice recognition Dragon dictation software  ____________________________________________  Time seen: 12:05 AM  I have reviewed the triage vital signs and the nursing notes.   History   Chief Complaint Leg Swelling   HPI  Kelly Guzman is a 48 y.o. female with PMHx of CHF, HTN who presents to the ED with leg swelling. Patient endorses swelling in both of her legs, onset 4 days ago. She reports being kicked out of the [employer] that she has been living at 4 days ago because it is only open during winter. She reports having to be on her feet significantly more since being kicked out of the shelter with increased walking. She states the swelling in her legs has progressively worsened over the past 4 days and significantly worsened today. She reports currently having to stay in public buildings that are open. Patient endorses associated shortness of breath and dizziness. She reports past medical history of CHF and hypertension. She reports taking carvedilol , valsartan , spironolactone , and amlodipine  daily. Patient reports previously being prescribed a medication for her leg swelling, but states she has been unable to get the prescription from the pharmacy. Denies chest pain, palpitations, dizziness, headache, numbness, or weakness. Denies fever, chills, nausea, vomiting, abdominal pain, or other medical complaints at this time.   Physical Exam   VITAL  SIGNS:   ED Triage Vitals [07/04/23 1852]  Temp 98 F (36.7 C)  Heart Rate 85  Resp 18  BP 143/81  MAP (mmHg) 102  SpO2 100 %  O2 Device None (Room air)  O2 Flow Rate (L/min)   Weight 75.3 kg (166 lb)    Constitutional: Alert and oriented. Well appearing and in no distress. Eyes: Conjunctivae are normal. ENT      Head: Normocephalic and atraumatic.      Neck: No stridor. Cardiovascular: Normal rate and rhythm. 2+ bilateral dorsalis pedis pulses. Heart rate: 77. Respiratory: Normal respiratory effort. Breath sounds are normal. Oxygen saturation 100% on room air. Gastrointestinal: Soft and nontender. Musculoskeletal: No deformities noted. 1+ symmetric pitting edema to bilateral lower extremities.  Neurologic: Normal speech and language. No gross focal neurologic deficits are appreciated. Psychiatric: Mood and affect are normal.   EKG   None  Radiology   All X-rays, CTs, and MRIs interpreted by radiologist and interpretation reviewed by me.  Procedures   Procedure(s) performed: None.  Pertinent labs & imaging results that were available during my care of the patient were reviewed by me and considered in my medical decision making (see chart for details).   ED Clinical Impression   1. Venous insufficiency Active  2. Swelling of lower extremity Active  3. Leukocytosis, unspecified type Active  4. Anemia, unspecified type Active    Medical Decision Making: Initial Impression, ED Course, Assessment and Plan   The following chart(s) was reviewed: Reviewed note from cardiology office visit on 01/30/2023 which showed controlled hypertension on amlodipine , carvedilol , spironolactone , and valsartan . Reviewed echocardiogram from 09/09/2021 which showed an  ejection fraction of 60-65%, Grade I diastolic dysfunction, and mild mitral valve regurgitation.   Medical Decision Making 48 year old female presents with leg swelling. The leg swelling bilateral. Differential diagnosis  includes deep vein thrombosis, congestive heart failure, hypoalbuminemia, chronic liver disease, chronic kidney disease, lymphedema, venous insufficiency, May-Thurner syndrome, intramuscular hematoma, baker's cyst. Venous duplex was considered and was not ordered.  The leg swelling is symmetric in bilateral and associated with recent increase in ambulation and standing upright as the patient has lost housing.  That makes DVT very unlikely and therefore duplex ultrasound was deferred. Diagnostic workup included labs.  The patient declined chest x-ray as she had negative chest x-ray 2 days ago. The history and exam are consistent with venous insufficiency.   I believe this is more likely than congestive heart failure. Albumin level is normal.  Creatinine is normal.  No evidence of nephrotic syndrome or hypoalbuminemia. The BNP is negative.  Nonspecific mild anemia and mild leukocytosis noted.  Plan for primary care follow-up.  I believe that lower extremity elevation and mechanical compression with compression stockings preferably thigh-high will be the treatment of choice here more so than diuretics.  Will allow the patient to take the loop diuretic for 3 or 4 days however I have discussed with the patient she needs to elevate her legs is much as possible and wear the compression hose when on her feet.  She will be given compression stockings here.  The patient is recently homeless for 4 days.  I have recommended that she return to the ED in the morning after 9 AM to see social work.  I have ordered a social work consult.  The hope would be that social work would talk to her and give her recommendations without the patient having to check in again.  Problems Addressed: Anemia, unspecified type: complicated acute illness or injury Leukocytosis, unspecified type: complicated acute illness or injury Swelling of lower extremity: complicated acute illness or injury Venous insufficiency: complicated acute  illness or injury  Amount and/or Complexity of Data Reviewed External Data Reviewed: radiology and notes.    Details: Reviewed note from cardiology office visit on 01/30/2023 which showed controlled hypertension on amlodipine , carvedilol , spironolactone , and valsartan . Reviewed echocardiogram from 09/09/2021 which showed an ejection fraction of 60-65%, Grade I diastolic dysfunction, and mild mitral valve regurgitation.  Labs: ordered. Decision-making details documented in ED Course. Radiology: ordered and independent interpretation performed. Decision-making details documented in ED Course.  Risk Prescription drug management.  No pitting edema noted to thighs.  Clinical Complexity  Patient's presentation is most consistent with severe exacerbation of chronic illness.  Patient's lack of prescription access, homelessness, and impaired access to primary care increases the complexity of managing their  presentation with leg swelling, likely venous insufficiency.    Provider time spent in patient care today, inclusive of but not limited to clinical reassessment, review of diagnostic studies, and discharge preparation, was greater than 30 minutes.       This document serves as a record of services personally performed by Fairy Brain, MD. It was created on their behalf by Joesph KANDICE Apa, Scribe, a trained medical scribe. The creation of this record is the provider's dictation and/or activities during the visit.   Electronically signed by: Joesph KANDICE Apa, Scribe 07/04/2023 9:45 PM

## 2023-07-05 NOTE — ED Provider Notes (Signed)
 Chief Complaint  Patient presents with  . Leg Pain       HPI HPI    48 year old female with a past medical history of hypertension, HFpEF, undomiciled presenting to the emergency department for foot pain and swelling.  Patient reports that her symptoms are bilateral.  They are worse when she is up on her feet all day.  Her swelling completely resolves when she elevates her feet.  Patient states that she is very concerned and does not feel like she can be out on the streets in this condition because it makes her swelling worse.  She has no chest pain, no shortness of breath.  She was seen in emergency department for this last night.  Patient History Past Medical History:  Diagnosis Date  . Acute on chronic combined systolic and diastolic CHF (congestive heart failure) (HCC) 04/28/2021  . Anemia   . CHD (coronary heart disease)   . Fibroids   . Hypertension    Past Surgical History:  Procedure Laterality Date  . CESAREAN SECTION, UNSPECIFIED     Procedure: CESAREAN SECTION  . HYSTEROSCOPY N/A 12/27/2022   HYSTEROSCOPY WITH ENDOMETRIAL SAMPLING performed by Hart Izetta Paddy, MD at [facility] L&D OR  . MYOMECTOMY VAGINAL APPROACH N/A 05/26/2021   Procedure: MYOMECTOMY HYSTEROSCOPIC AND EUA (EXAM UNDER ANESTHESIA ;  Surgeon: Hart Izetta Paddy, MD;  Location: [facility] OB OR;  Service: Gynecology;  Laterality: N/A;  . RADIOFREQUENCY ABLATION N/A 12/27/2022   ABLATION RADIOFREQUENCY--SONATA performed by Hart Izetta Paddy, MD at [facility] L&D OR   No family history on file. Social History   Tobacco Use  . Smoking status: Never  . Smokeless tobacco: Never  Substance Use Topics  . Alcohol use: Not Currently  . Drug use: Never      Review of Systems Review of Systems    Physical Exam ED Triage Vitals [07/05/23 0705]  Temp 98.1 F (36.7 C)  Heart Rate 93  Resp 18  BP 152/81  MAP (mmHg) 105  SpO2 98 %  O2 Device None (Room air)  O2 Flow Rate (L/min)   Weight  75.3 kg (166 lb)   Physical Exam Constitutional:      General: She is not in acute distress.    Appearance: Normal appearance. She is not toxic-appearing.  HENT:     Head: Normocephalic.     Mouth/Throat:     Mouth: Mucous membranes are moist.     Pharynx: Oropharynx is clear. No oropharyngeal exudate or posterior oropharyngeal erythema.  Eyes:     Extraocular Movements: Extraocular movements intact.     Pupils: Pupils are equal, round, and reactive to light.  Cardiovascular:     Rate and Rhythm: Normal rate and regular rhythm.  Pulmonary:     Effort: Pulmonary effort is normal. No respiratory distress.  Abdominal:     General: Abdomen is flat. There is no distension.     Tenderness: There is no abdominal tenderness. There is no guarding or rebound.  Musculoskeletal:        General: No swelling.     Cervical back: Normal range of motion.  Skin:    General: Skin is warm and dry.     Capillary Refill: Capillary refill takes less than 2 seconds.  Neurological:     Mental Status: She is alert and oriented to person, place, and time.        CHA2DS2-VASc Score: N/A  Glasgow Coma Scale Score: 15  Procedures                       ED Course & MDM   Medical Decision Making Problems Addressed: Dependent edema: complicated acute illness or injury  Risk Prescription drug management. Decision regarding hospitalization.    48 year old female presenting with dependent edema.  Her swelling completely goes away when she elevates her feet.  She has none currently.  Her largest concerns are unmet social needs exacerbated by her chronic medical conditions.  Patient has a prescription for diuretics already but I reviewed her most recent blood work, her BNP is negative, her creatinine is within acceptable limits, she has no symptoms of the clinical syndrome of heart failure, I strongly suspect that she is suffering from dependent edema largely unrelated to her CHF and she  needs compression stockings and elevation of her feet whenever possible.  Discussed the case with social work, patient was provided with resources, no further emergent workup needed.  Discharged in stable condition.  ED Disposition:  Discharge Final diagnoses:  Dependent edema    ED Prescriptions   None

## 2023-07-09 NOTE — ED Provider Notes (Signed)
 Emergency Department Provider Note   History   Chief Complaint  Patient presents with  . Leg Swelling    Swollen feet      History provided by:  Patient  Patient presents for evaluation of ongoing bilateral leg swelling.  This is a chronic problem.  She is currently unhoused and she states the symptoms are worse when she is on her feet a lot.  I saw her last week and prescribed Lasix .  She states it helped some but she is still having swelling that is worse with being on her feet.  She denies any shortness of breath.  Past Medical History Past Medical History:  Diagnosis Date  . Acute on chronic combined systolic and diastolic CHF (congestive heart failure) (HCC) 04/28/2021  . Anemia   . CHD (coronary heart disease)   . Fibroids   . Hypertension      Past Surgical History Past Surgical History:  Procedure Laterality Date  . CESAREAN SECTION, UNSPECIFIED     Procedure: CESAREAN SECTION  . HYSTEROSCOPY N/A 12/27/2022   HYSTEROSCOPY WITH ENDOMETRIAL SAMPLING performed by Hart Izetta Paddy, MD at [facility] L&D OR  . MYOMECTOMY VAGINAL APPROACH N/A 05/26/2021   Procedure: MYOMECTOMY HYSTEROSCOPIC AND EUA (EXAM UNDER ANESTHESIA ;  Surgeon: Hart Izetta Paddy, MD;  Location: [facility] OB OR;  Service: Gynecology;  Laterality: N/A;  . RADIOFREQUENCY ABLATION N/A 12/27/2022   ABLATION RADIOFREQUENCY--SONATA performed by Hart Izetta Paddy, MD at [facility] L&D OR      Medications Current Outpatient Medications  Medication Instructions  . amLODIPine  (NORVASC ) 5 mg, oral, Daily  . ASHWAGANDHA EXTRACT ORAL 1 Dose, oral, At bedtime, vitafusion  . carvediloL  (COREG ) 25 mg, oral, 2 times daily with meals  . furosemide  (LASIX ) 20 mg, oral, Daily  . multivitamin (THERAGRAN) tab tablet 1 tablet, oral, Daily  . potassium chloride  20 mEq ER tablet 20 mEq, oral, 2 times daily  . spironolactone  (ALDACTONE ) 25 mg, oral, Daily  . valsartan  (DIOVAN ) 160 mg, oral, Daily      Allergies Allergies   Allergen Reactions  . Adhesive Rash  . Penicillins Rash     Family History No family history on file.   Social History Social History   Socioeconomic History  . Marital status: Single    Spouse name: Not on file  . Number of children: Not on file  . Years of education: Not on file  . Highest education level: Not on file  Occupational History  . Not on file  Tobacco Use  . Smoking status: Never  . Smokeless tobacco: Never  Substance and Sexual Activity  . Alcohol use: Not Currently  . Drug use: Never  . Sexual activity: Not on file  Other Topics Concern  . Not on file  Social History Narrative  . Not on file   Social Drivers of Health   Food Insecurity: Food Insecurity Present (08/10/2022)   [facility]   Food vital sign   . Within the past 12 months, you worried that your food would run out before you got money to buy more: Sometimes true   . Within the past 12 months, the food you bought just didn't last and you didn't have money to get more: Sometimes true  Transportation Needs: Unmet Transportation Needs (08/10/2022)   Received from [facility] - Transportation   . Lack of Transportation (Medical): No   . Lack of Transportation (Non-Medical): Yes  Safety: Not on file  Living Situation: Not  on file      Review of Systems  Review of Systems  Respiratory:  Negative for shortness of breath.   Cardiovascular:  Positive for leg swelling.     Physical Exam   ED Triage Vitals [07/09/23 2217]  Temp 97.9 F (36.6 C)  Heart Rate 90  Resp 18  BP 129/76  MAP (mmHg) 94  SpO2 98 %  O2 Device None (Room air)  O2 Flow Rate (L/min)   Weight 75.3 kg (166 lb)    Physical Exam Constitutional:      General: She is not in acute distress.    Appearance: Normal appearance.  HENT:     Head: Atraumatic.     Mouth/Throat:     Mouth: Mucous membranes are moist.  Eyes:     Extraocular Movements: Extraocular movements intact.   Cardiovascular:     Rate and Rhythm: Normal rate and regular rhythm.  Pulmonary:     Effort: Pulmonary effort is normal. No respiratory distress.  Abdominal:     General: Abdomen is flat. There is no distension.  Musculoskeletal:        General: No deformity.     Cervical back: Normal range of motion.     Right lower leg: Edema present.     Left lower leg: Edema present.     Comments: Mild edema to bilateral lower extremities.  Skin:    General: Skin is warm and dry.  Neurological:     Mental Status: She is alert and oriented to person, place, and time. Mental status is at baseline.  Psychiatric:        Mood and Affect: Mood normal.     Labs  None  Radiology  None  Procedure Note  Procedures  Medical Decision Making   Medical Decision Making Patient is a 48 year old female who presents for evaluation of ongoing bilateral leg swelling.  Vital signs are reassuring.  She does not have signs of severe CHF at this time.  She is not hypoxic or short of breath.  Unfortunately, patient is unhoused.  I believe this is the primary reason for her frequent visits.  She states she needs somewhere to prop her legs up.  She is requesting additional Lasix , however she does not have any immediate need for diuresis.  I explained that I would not be providing any additional Lasix  for dependent edema.  I have recommended that she follow-up with a primary physician to manage her chronic medical conditions.  She appears stable for discharge at this time.  I do not believe she needs repeat labs or imaging at this time.  Problems Addressed: Housing instability: acute illness or injury Leg swelling: acute illness or injury  Amount and/or Complexity of Data Reviewed External Data Reviewed: labs and notes.    Details: Reviewed multiple ED notes from this week as well as labs obtained at her recent ED visit.     ED Clinical Impression   1. Leg swelling   2. Housing instability      ED  Disposition     ED Disposition  Discharge   Condition  Stable   Comment  --           New Prescriptions   No medications on file      FOLLOW UP [facility] - PHYSICIAN & [place] [facility] [address] [place]  [address] [phone]  Call to establish care and obtain a follow up appointment with a primary care  physician  [facility] -  EMERGENCY DEPARTMENT [address] [employer] [place]  [address] [phone]  As needed, If symptoms worsen

## 2023-07-10 NOTE — ED Provider Notes (Signed)
 Emergency Department Provider Note  This document was created using the aid of voice recognition Dragon dictation software.  History   Chief Complaint  Patient presents with  . Leg Pain      History provided by:  Patient   Kelly Guzman is a 48 y.o. female who presents to the ED with complaints of bilateral lower extremity swelling, onset 1 week ago. Patient reports associated pain and mild erythema. Patient has been evaluated for this recently and was prescribed a 3-day course of Lasix  with some relief. She notes she is standing or walking most of the day due to her current living situation. Patient denies any wounds. She denies numbness to her feet.   No personal or family history of DVT, VTE  Physical Exam   Vitals:   07/10/23 2347 07/11/23 0040  BP: 126/71   BP Location: Right arm   Patient Position: Sitting   Pulse: 81   Resp: 17   Temp:  98.5 F (36.9 C)  TempSrc: Oral Oral  SpO2: 100%   Weight: 77.6 kg (171 lb)     Physical Exam Vitals and nursing note reviewed.  Constitutional:      Appearance: Normal appearance. She is not toxic-appearing or diaphoretic.  HENT:     Head: Normocephalic and atraumatic.     Nose: Nose normal.     Mouth/Throat:     Mouth: Mucous membranes are moist.  Eyes:     Conjunctiva/sclera: Conjunctivae normal.  Pulmonary:     Effort: Pulmonary effort is normal. No respiratory distress.  Musculoskeletal:        General: Normal range of motion.     Cervical back: Normal range of motion and neck supple. No rigidity.     Right lower leg: Edema present.     Left lower leg: Edema present.  Skin:    General: Skin is warm and dry.     Capillary Refill: Capillary refill takes less than 2 seconds.  Neurological:     Mental Status: She is alert.  Psychiatric:        Behavior: Behavior normal.     Procedure Note  Procedures  Medical Decision Making  Differentials considered: Acute venous occlusion, DVT, arterial stenosis or  occlusion, acute traumatic injury (fracture versus dislocation versus musculoskeletal strain, sprain), acute infectious etiology, abscess versus cellulitis, neuropathic pain, nerve impingement, neuropathy, palsy, lymphedema, venous insufficiency  On my initial exam, the pt was calm, cooperative, conversant, follows commands appropriately, GCS 15, not tachycardic, not hypotensive, afebrile, no increased work of breathing or respiratory distress, no signs of impending respiratory failure.   Patient hooked up to cardiac telemetry for close monitoring, telemetry interpretation: Sinus rhythm, rate appropriate.  Good pulse to bilateral feet as well as good sensation range of motion, no obvious skin defect or open wound or rash, good motor strength  Plan: ED Course as of 07/12/23 0035  Mon Jul 10, 2023  2355 TTE from 2023:  1. Left ventricular ejection fraction, by estimation, is 60 to 65%. The left ventricle has normal function. The left ventricle has no regional wall motion abnormalities. Left ventricular diastolic parameters are consistent with Grade I diastolic  dysfunction (impaired relaxation).   2. Right ventricular systolic function is normal. The right ventricular size is normal. There is normal pulmonary artery systolic pressure.   3. Left atrial size was mildly dilated.   4. The mitral valve is normal in structure. Mild mitral valve regurgitation. No evidence of mitral stenosis.   5.  The aortic valve is normal in structure. Aortic valve regurgitation is not visualized. No aortic stenosis is present.   6. The inferior vena cava is normal in size with greater than 50% respiratory variability, suggesting right atrial pressure of 3 mmHg.    [DC]  Wed Jul 12, 2023  0025 Discussed with patient about management options, has not been taking any of her routine medications due to difficulty with prescription access, will send in new prescriptions to pharmacy here at the hospital as well as social  work, case management consult for prescription access, will also place patient on short course of diuretics for peripheral lymphedema, also educated about compressive stockings, elevation and other management techniques for symptoms [DC]    ED Course User Index [DC] Olita Duncan, MD   Based on the above findings, I believe patient is hemodynamically stable for discharge Patient/and family educated about specific return precautions for given chief complaint and symptoms.  Patient/and family educated about follow-up with PCP.  Patient/and family expressed understanding of return precautions and need for follow-up.  Patient discharged.   ED Clinical Impression   1. Peripheral edema Active  2. Leg swelling Active   ED Assessment/Plan  Clinical Complexity Number and complexity of problems addressed: Medical Decision Making Problems Addressed: Leg swelling: complicated acute illness or injury that poses a threat to life or bodily functions Peripheral edema: complicated acute illness or injury that poses a threat to life or bodily functions  Amount and/or Complexity of Data Reviewed External Data Reviewed: notes.    Details: Reviewed gynecology visit with Hart Paddy, MD on 05/23/2022 for fibroids, palpitations, ankle swelling, HFpEF. Discussion of management or test interpretation with external provider(s): Social work, case management for prescription access  Risk OTC drugs. Prescription drug management. Diagnosis or treatment significantly limited by social determinants of health. Risk Details: Care of patient's acute presentation, complicated by patient's lack of access to primary care provider, additional education and time spent with patient going over importance of establishing care with PCP for health maintenance and for ongoing management and assessment of patient's acute presentation, resources given to patient to help facilitate close outpatient follow-up for continuity of  care. Care of patient's acute presentation, complicated by patient's lack of access to reliable housing, SW/CM consulted and patient given resources for local shelters, groups homes, organizations to assist with housing, food insecurity.      Emergency Department Medication Summary: Medications  furosemide  (LASIX ) tablet 20 mg (20 mg oral Given 07/11/23 0033)   ED Disposition: ED Disposition     ED Disposition  Discharge   Condition  Stable   Comment  --        This document serves as a record of services personally performed by Olita Duncan, MD. It was created on their behalf by Sherryle Hamilton, Scribe, a trained medical scribe. The creation of this record is the provider's dictation and/or activities during the visit.   Electronically signed by: Sherryle Hamilton, Scribe 07/12/23 12:35 AM

## 2023-07-11 NOTE — ED Provider Notes (Signed)
 Emergency Department Provider Note  Dragon voice dictation used for charting.    Provider at bedside: 10:29 PM  History obtained from the: Patient  History   Chief Complaint  Patient presents with  . Extremity Swelling     HPI  Kelly Guzman is a 48 y.o. female who presents to the ED with complaints of lower extremity swelling.  Patient endorses a few months of lower extremity edema.  She states that the swelling worsens when she is up on her feet for extended periods of time and she notes that she has been up walking around quite a lot recently.  The patient is currently homeless and states that she has been walking quite a lot and does not have a place to elevate her legs.  She has been seen in the ED multiple times for this issue and was recently given a short course of Lasix .  She states that this initially helped her symptoms, but they have returned.  Patient denies any shortness of breath or chest pain.  The triage note does mention dizziness, but the patient denies dizziness at this time.    Patient's last menstrual period was 06/26/2023 (approximate).   Past Medical History Past Medical History:  Diagnosis Date  . Acute on chronic combined systolic and diastolic CHF (congestive heart failure) (HCC) 04/28/2021  . Anemia   . CHD (coronary heart disease)   . Fibroids   . Hypertension     Past Surgical History Past Surgical History:  Procedure Laterality Date  . CESAREAN SECTION, UNSPECIFIED     Procedure: CESAREAN SECTION  . HYSTEROSCOPY N/A 12/27/2022   HYSTEROSCOPY WITH ENDOMETRIAL SAMPLING performed by Hart Izetta Paddy, MD at [facility] L&D OR  . MYOMECTOMY VAGINAL APPROACH N/A 05/26/2021   Procedure: MYOMECTOMY HYSTEROSCOPIC AND EUA (EXAM UNDER ANESTHESIA ;  Surgeon: Hart Izetta Paddy, MD;  Location: [facility] OB OR;  Service: Gynecology;  Laterality: N/A;  . RADIOFREQUENCY ABLATION N/A 12/27/2022   ABLATION RADIOFREQUENCY--SONATA performed by Hart Izetta Paddy, MD at [facility] L&D OR      Allergies Allergies  Allergen Reactions  . Adhesive Rash  . Penicillins Rash     Family History No family history on file.   Social History Social History   Tobacco Use  . Smoking status: Never  . Smokeless tobacco: Never  Substance Use Topics  . Alcohol use: Not Currently  . Drug use: Never      Physical Exam   Vitals:   07/11/23 2151  BP: 132/82  BP Location: Right arm  Patient Position: Sitting  Pulse: 79  Resp: 17  Temp: 97.8 F (36.6 C)  TempSrc: Oral  SpO2: 99%  Weight: 74.4 kg (164 lb)  Height: 152.4 cm (5')    Physical Exam Vitals and nursing note reviewed.  Constitutional:      General: She is not in acute distress.    Appearance: She is well-developed. She is not toxic-appearing.  HENT:     Head: Normocephalic and atraumatic.     Right Ear: External ear normal.     Left Ear: External ear normal.  Eyes:     General: Lids are normal.     Conjunctiva/sclera: Conjunctivae normal.  Cardiovascular:     Rate and Rhythm: Normal rate and regular rhythm.     Pulses: Normal pulses.     Heart sounds: Normal heart sounds.  Pulmonary:     Effort: Pulmonary effort is normal.     Breath sounds: Normal breath sounds.  Musculoskeletal:     Right lower leg: Edema present.     Left lower leg: Edema present.     Comments: 2+ edema to bilateral lower extremities  Neurological:     Mental Status: She is alert.  Psychiatric:        Mood and Affect: Mood normal.        Behavior: Behavior normal.     Labs   Lab Results (last 24 hours)     ** No results found for the last 24 hours. **         Radiology   Radiology Results (last 72 hours)     ** No results found for the last 72 hours. **        EKG     No results found for this visit on 07/11/23.   ED Course      Procedure Note   Procedures  Medical Decision Making   Clinical Complexity  Patient's presentation is most consistent with  exacerbation of chronic illness.   Provider time spent in patient care today, inclusive of but not limited to clinical reassessment, review of diagnostic studies, and discharge preparation, was less than 30 minutes.    Medical Decision Making Problems Addressed: Dependent edema: acute illness or injury     Differential diagnosis includes but is not limited to CHF, dependent edema, DVT  ED Clinical Impression   1. Dependent edema      ED Assessment/Plan  Mrs. Lachman presented to the ED with a chief complaint of lower extremity edema.  Upon arrival to patient, she was resting on the hospital chair no acute distress.  Physical exam was remarkable for the above findings.  This is the patient's sixth ED visit for the same issue in the last 2 weeks.  Patient has a Wells DVT score of 0 indicating low risk of DVT.  Given that her swelling is equal bilaterally and worsens when she is up on her feet quite frequently, dependent edema seems to be the most likely cause of her symptoms.  Strict return precautions were discussed and she was given the rest of her discharge instructions.  She stated that she understood and had no further questions.  She was discharged in stable condition.  ED Meds Given During Visit Medications - No data to display    Discharge Medication List as of 07/11/2023 11:05 PM        FOLLOW UP [facility] [address] [place] [address] [phone]   As needed to establish a PCP  [facility] -  EMERGENCY DEPARTMENT [address] [place]  [address] [phone]     Electronically signed by: 10:29 PM 07/14/2023 for Allyson Schlagheck PA-C

## 2023-07-21 ENCOUNTER — Encounter: Payer: Self-pay | Admitting: Hematology and Oncology

## 2023-08-01 ENCOUNTER — Ambulatory Visit: Payer: Medicaid Other | Admitting: Physician Assistant

## 2023-08-07 NOTE — ED Provider Notes (Signed)
 Emergency Department Provider Note   History   Chief Complaint  Patient presents with  . Abdominal Pain      History provided by:  Patient Abdominal Pain Pain location:  Suprapubic Pain quality: aching   Pain radiates to:  Does not radiate Pain severity:  Mild Onset quality:  Sudden Duration:  1 hour Timing:  Constant Progression:  Partially resolved Chronicity:  New Relieved by:  None tried Worsened by:  Nothing Ineffective treatments:  None tried    Past Medical History Past Medical History:  Diagnosis Date  . Acute on chronic combined systolic and diastolic CHF (congestive heart failure) (HCC) 04/28/2021  . Anemia   . CHD (coronary heart disease)   . Fibroids   . Hypertension      Past Surgical History Past Surgical History:  Procedure Laterality Date  . CESAREAN SECTION, UNSPECIFIED     Procedure: CESAREAN SECTION  . HYSTEROSCOPY N/A 12/27/2022   HYSTEROSCOPY WITH ENDOMETRIAL SAMPLING performed by Hart Izetta Paddy, MD at [facility] L&D OR  . MYOMECTOMY VAGINAL APPROACH N/A 05/26/2021   Procedure: MYOMECTOMY HYSTEROSCOPIC AND EUA (EXAM UNDER ANESTHESIA ;  Surgeon: Hart Izetta Paddy, MD;  Location: [facility] OB OR;  Service: Gynecology;  Laterality: N/A;  . RADIOFREQUENCY ABLATION N/A 12/27/2022   ABLATION RADIOFREQUENCY--SONATA performed by Hart Izetta Paddy, MD at [facility] L&D OR      Medications Current Outpatient Medications  Medication Instructions  . amLODIPine  (NORVASC ) 5 mg, oral, Daily  . ASHWAGANDHA EXTRACT ORAL 1 Dose, oral, At bedtime, vitafusion  . carvediloL  (COREG ) 25 mg, oral, 2 times daily with meals  . furosemide  (LASIX ) 20 mg, oral, 2 times daily  . multivitamin (THERAGRAN) tab tablet 1 tablet, oral, Daily  . potassium chloride  20 mEq ER tablet 20 mEq, oral, 2 times daily  . spironolactone  (ALDACTONE ) 25 mg, oral, Daily  . valsartan  (DIOVAN ) 160 mg, oral, Daily      Allergies Allergies  Allergen Reactions  . Adhesive Rash  .  Penicillins Rash     Family History No family history on file.   Social History Social History   Socioeconomic History  . Marital status: Single    Spouse name: Not on file  . Number of children: Not on file  . Years of education: Not on file  . Highest education level: Not on file  Occupational History  . Not on file  Tobacco Use  . Smoking status: Never  . Smokeless tobacco: Never  Substance and Sexual Activity  . Alcohol use: Not Currently  . Drug use: Never  . Sexual activity: Not on file  Other Topics Concern  . Not on file  Social History Narrative  . Not on file   Social Drivers of Health   Food Insecurity: Food Insecurity Present (08/10/2022)   Received from [facility]   Food vital sign   . Within the past 12 months, you worried that your food would run out before you got money to buy more: Sometimes true   . Within the past 12 months, the food you bought just didn't last and you didn't have money to get more: Sometimes true  Transportation Needs: Unmet Transportation Needs (08/10/2022)   Received from [facility] - Transportation   . Lack of Transportation (Medical): No   . Lack of Transportation (Non-Medical): Yes  Safety: Not on file  Living Situation: Not on file      Review of Systems  Review of Systems  Gastrointestinal:  Positive for  abdominal pain.     Physical Exam   ED Triage Vitals [08/07/23 2224]  Temp 98.2 F (36.8 C)  Heart Rate 89  Resp 15  BP (!) 183/118  MAP (mmHg) 140  SpO2 98 %  O2 Device None (Room air)  O2 Flow Rate (L/min)   Weight 78.4 kg (172 lb 12.8 oz)    Physical Exam Constitutional:      General: She is not in acute distress.    Appearance: Normal appearance.  HENT:     Head: Atraumatic.     Mouth/Throat:     Mouth: Mucous membranes are moist.  Eyes:     Extraocular Movements: Extraocular movements intact.  Cardiovascular:     Rate and Rhythm: Normal rate and regular rhythm.  Pulmonary:      Effort: Pulmonary effort is normal. No respiratory distress.  Abdominal:     General: Abdomen is flat. There is no distension.     Palpations: Abdomen is soft.     Tenderness: There is abdominal tenderness in the suprapubic area. There is no guarding or rebound.     Comments: Very mild suprapubic tenderness  Musculoskeletal:        General: No deformity.     Cervical back: Normal range of motion.  Skin:    General: Skin is warm and dry.  Neurological:     Mental Status: She is alert and oriented to person, place, and time. Mental status is at baseline.  Psychiatric:        Mood and Affect: Mood normal.     Labs   Abnormal Labs Reviewed  COMPREHENSIVE METABOLIC PANEL - Abnormal; Notable for the following components:      Result Value   Glucose, Random 116 (*)    All other components within normal limits  URINALYSIS WITH REFLEX TO MICROSCOPIC - Abnormal; Notable for the following components:   Leukocyte Esterase, Urine 75 (*)    All other components within normal limits  CBC WITH DIFFERENTIAL - Abnormal; Notable for the following components:   WBC 13.62 (*)    RBC 5.18 (*)    Hemoglobin 11.2 (*)    Hematocrit 35.7 (*)    Mean Corpuscular Volume (MCV) 69.0 (*)    Mean Corpuscular Hemoglobin (MCH) 21.7 (*)    Mean Corpuscular Hemoglobin Conc (MCHC) 31.5 (*)    Red Cell Distribution Width (RDW) 19.1 (*)    Neutrophils Absolute 10.00 (*)    All other components within normal limits    Labs independently reviewed by myself and considered in medical decision making.  Radiology   CT Abdomen Pelvis W Contrast  Final Result by Pinkie Pebbles, MD (04/29 0009)  EXAM:  CT ABDOMEN AND PELVIS WITH CONTRAST  08/08/2023 12:00:31 AM    TECHNIQUE:  CT of the abdomen and pelvis was performed with 80 mL of iohexol    (OMNIPAQUE )  350 mg iodine/mL injection (MDV). Multiplanar reformatted images are   provided  for review. Automated exposure control, iterative reconstruction, and/or    weight  based adjustment of the mA/kV was utilized to reduce the radiation dose to   as  low as reasonably achievable.    COMPARISON:  07/14/2020    CLINICAL HISTORY:  Abd pain. Abd pain.    FINDINGS:    LOWER CHEST:  No acute abnormality.    HEPATOBILIARY:  The liver is unremarkable. Gallbladder is decompressed. No biliary ductal  dilatation.    SPLEEN:  No acute abnormality.    PANCREAS:  No  acute abnormality.    ADRENAL GLANDS:  No acute abnormality.    KIDNEYS, URETERS AND BLADDER:  12 mm simple left lower pole renal cyst (image 34), benign (Bosniak 1). No  follow-up is recommended. No stones in the kidneys or ureters. No evidence   of  hydronephrosis. No evidence of perinephric or periureteral stranding.   Urinary  bladder is unremarkable.    GI AND BOWEL:  Stomach demonstrates no acute abnormality. There is no evidence of bowel  obstruction. Normal appendix (image 53).    PERITONEUM AND RETROPERITONEUM:  Trace pelvic ascites. No free air. Aorta is normal in caliber.    LYMPH NODES:  No evidence of lymphadenopathy.    PELVIS:  Uterine fibroids.    BONES AND SOFT TISSUES:  No acute osseous abnormality. No focal soft tissue abnormality.    Incidental adrenal and/or renal findings do not require follow up imaging.    IMPRESSION:  1. No acute findings.  2. Additional ancillary findings, as above.    Electronically signed by: Pinkie Pebbles MD 08/08/2023 12:09 AM EDT RP  Workstation: [id number]        Imaging independently reviewed by myself and considered in medical decision making. Imaging final read interpreted by radiology.  Procedure Note  Procedures  Medical Decision Making   Medical Decision Making Patient is a 48 year old female with history as above presents for evaluation of lower abdominal pain.  She began having pain about 20 minutes earlier while she was walking.  She denies any urinary symptoms.  On exam, she is overall  well-appearing.  She has mild suprapubic tenderness.  Differential diagnosis includes cystitis, nephrolithiasis, diverticulitis, appendicitis.  Labs obtained which are notable for leukocytosis.  CT scan ordered to evaluate further.  UA shows no evidence of infection.  CT scan is reassuring with no acute findings.  Patient appears stable for discharge at this time.  She is hypertensive but follows with cardiology.  She has been taking her medication.  I have advised her to follow-up with her primary doctor as well as cardiology for optimization of her hypertension.  Problems Addressed: Generalized abdominal pain: complicated acute illness or injury Hypertension, unspecified type: complicated acute illness or injury  Amount and/or Complexity of Data Reviewed External Data Reviewed: notes.    Details: Reviewed ED notes from earlier this month as well as March. Labs: ordered. Decision-making details documented in ED Course. Radiology: ordered and independent interpretation performed.  Risk Prescription drug management.     ED Clinical Impression   1. Generalized abdominal pain   2. Hypertension, unspecified type      ED Disposition     ED Disposition  Discharge   Condition  Stable   Comment  --           Discharge Medication List as of 08/08/2023 12:20 AM        FOLLOW UP Aliene DELENA Colon, MD [facility] [address] [phone]  Schedule an appointment as soon as possible for a visit    [facility] -  EMERGENCY DEPARTMENT [address] [place] [place]  [address] [phone]  As needed, If symptoms worsen

## 2023-10-02 ENCOUNTER — Encounter: Payer: Self-pay | Admitting: *Deleted

## 2023-10-04 ENCOUNTER — Ambulatory Visit: Admitting: Physician Assistant

## 2023-10-15 NOTE — Progress Notes (Deleted)
 Cardiology Office Note:  .   Date:  10/15/2023  ID:  Leonardo Bolognese, DOB [birth year], MRN [id number] PCP: Shelda Atlas, MD  [facility] Providers Cardiologist:  Aleene Passe, MD {  History of Present Illness: .   Martine Kuechle is a 48 y.o. 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   Last seen in heartcare 01/2023 with Glendia Ferrier, PA-C for follow-up.  Reported occasional edema in left ankle, occasional dizziness, left calf pain with certain movements, and blurry vision that was checked by optometry.  Denied any CP, syncope, orthopnea, or significant leg edema.  No med changes.  Ordered a BMP, which was normal.  Follow-up in 6 months.  Multiple hospitalizations since last OV as below:  07/02/2023 at [facility]: LE edema and discharged with Lasix  20 mg X 3 days along with KCL 20 MEq x 3 days. 07/04/2023, 07/05/2023, 3/30/205 at [facility]: LE edema with no meds given. 07/10/2023 at [facility]: LE edema treated with Lasix  20 mg. 07/11/2023 at [facility]: LE edema with no acute distress and 2+ pitting edema bilaterally on PE. Suspected secondary to dependent edema since bilateral and worsens after prolonged standing.  Noted this was the 6th ED visit for same issue in the last 2 weeks.  No meds were given. 08/07/2023 at [facility]: Generalized abdominal pain with negative CT and UA.  Noted being hypertensive (183/118) and encouraged to follow-up with cardiology to optimize HTN.  Today, reports ### and denies ###.  Reports compliance with medications.  Dietary habitats:  Activity level:  Social: Denies tobacco use/Binging ETOH/drug use  Denies any hospitalizations or visits to the emergency department.   HFimpEF  EF of 35 to 40% in the setting of profound anemia in 07/2020, EF of 50 to 55% in 10/2020, and EF of  60 to 65% in 09/2021 Continue on Coreg  25 mg twice daily, spironolactone  25 mg daily, and valsartan  160 mg daily.  HTN BP this office visit, well-controlled: Continue on amlodipine  5 mg daily and Coreg /spironolactone /valsartan  as above.  05/12/2020, 8  ROS: 10 point review of system has been reviewed and considered negative except ones been listed in the HPI.   Studies Reviewed: SABRA   ECHO IMPRESSIONS 09/2021  1. Left ventricular ejection fraction, by estimation, is 60 to 65%. The  left ventricle has normal function. The left ventricle has no regional  wall motion abnormalities. Left ventricular diastolic parameters are  consistent with Grade I diastolic  dysfunction (impaired relaxation).   2. Right ventricular systolic function is normal. The right ventricular  size is normal. There is normal pulmonary artery systolic pressure.   3. Left atrial size was mildly dilated.   4. The mitral valve is normal in structure. Mild mitral valve  regurgitation. No evidence of mitral stenosis.   5. The aortic valve is normal in structure. Aortic valve regurgitation is  not visualized. No aortic stenosis is present.   6. The inferior vena cava is normal in size with greater than 50%  respiratory variability, suggesting right atrial pressure of 3 mmHg.   Comparison(s): The left ventricular function has improved.  Risk Assessment/Calculations:   {Does this patient have ATRIAL FIBRILLATION?:[id number]} No BP recorded.  {Refresh Note OR Click here to enter BP  :1}***  Physical Exam:   VS:  There were no vitals taken for this visit.   Wt Readings from Last 3 Encounters:  01/30/23 152 lb 9.6 oz (69.2 kg)  09/26/22 150 lb (68 kg)  09/19/22 151 lb 11.2 oz (68.8 kg)    GEN: Well nourished, well developed in no acute distress while sitting in chair.  NECK: No JVD; No carotid bruits CARDIAC: ***RRR, no murmurs, rubs, gallops RESPIRATORY:  Clear to auscultation without rales, wheezing or rhonchi   ABDOMEN: Soft, non-tender, non-distended EXTREMITIES:  No edema; No deformity   ASSESSMENT AND PLAN: .   ***    {Are you ordering a CV Procedure (e.g. stress test, cath, DCCV, TEE, etc)?   Press F2        :[id number]}  Dispo: ***  Signed, Lorette CINDERELLA Kapur, PA-C

## 2023-10-17 ENCOUNTER — Ambulatory Visit: Admitting: General Practice

## 2023-11-30 ENCOUNTER — Encounter: Payer: Self-pay | Admitting: Hematology and Oncology

## 2023-12-07 ENCOUNTER — Encounter: Payer: Self-pay | Admitting: General Practice

## 2023-12-07 ENCOUNTER — Ambulatory Visit: Attending: Cardiology | Admitting: Physician Assistant

## 2023-12-07 VITALS — BP 114/74 | HR 80 | Ht 60.0 in | Wt 164.0 lb

## 2023-12-07 DIAGNOSIS — I5032 Chronic diastolic (congestive) heart failure: Secondary | ICD-10-CM | POA: Insufficient documentation

## 2023-12-07 DIAGNOSIS — R0789 Other chest pain: Secondary | ICD-10-CM | POA: Diagnosis present

## 2023-12-07 DIAGNOSIS — I10 Essential (primary) hypertension: Secondary | ICD-10-CM | POA: Diagnosis present

## 2023-12-07 MED ORDER — FUROSEMIDE 20 MG PO TABS: 20.0000 mg | ORAL_TABLET | Freq: Every day | ORAL | 3 refills | Status: DC | PRN

## 2023-12-07 MED ORDER — AMLODIPINE BESYLATE 5 MG PO TABS: 2.5000 mg | ORAL_TABLET | Freq: Every day | ORAL | 3 refills | Status: DC

## 2023-12-07 NOTE — Patient Instructions (Addendum)
 Medication Instructions:  DECREASE Amlodipine  to 2.5 mg once daily  AS NEEDED - Lasix  20 mg once daily   *If you need a refill on your cardiac medications before your next appointment, please call your pharmacy*  Lab Work: None ordered today. If you have labs (blood work) drawn today and your tests are completely normal, you will receive your results only by: MyChart Message (if you have MyChart) OR A paper copy in the mail If you have any lab test that is abnormal or we need to change your treatment, we will call you to review the results.  Testing/Procedures: None ordered today.  Follow-Up: At [facility], you and your health needs are our priority.  As part of our continuing mission to provide you with exceptional heart care, our providers are all part of one team.  This team includes your primary Cardiologist (physician) and Advanced Practice Providers or APPs (Physician Assistants and Nurse Practitioners) who all work together to provide you with the care you need, when you need it.  Your next appointment:   3 month(s)  Provider:   Josefa Beauvais, NP  We recommend signing up for the patient portal called MyChart.  Sign up information is provided on this After Visit Summary.  MyChart is used to connect with patients for Virtual Visits (Telemedicine).  Patients are able to view lab/test results, encounter notes, upcoming appointments, etc.  Non-urgent messages can be sent to your provider as well.   To learn more about what you can do with MyChart, go to [email or web address].   Other Instructions Do not drink over 60 ounces of water daily.  If your blood pressure is greater than 140 systolic (top number) consistently, call our office to let us  know.

## 2023-12-07 NOTE — Progress Notes (Unsigned)
 Cardiology Office Note:  .   Date:  12/07/2023  ID:  Kelly Guzman, DOB 1976-01-27, MRN [id number] PCP: Patient, No Pcp Per  [facility] Providers Cardiologist:  Ozell Fell, MD  - NEW  History of Present Illness: .   Kelly Guzman is a 48 y.o. female  with PMHx of HFimpEF (EF 35-40% in 07/2020 in setting of profound anemia improved to 60-65% in 09/2021), HTN, iron deficiency anemia who reports to [facility] office for follow up.   Last seen in heartcare 01/30/2023 by Glendia Ferrier, PA-C. Reported occasional dizziness, occasional edema in left ankle, left calk pain with certain movements, and blurry vision that was checked with optometry. Noted that patient was back on medications and BP now controlled compared to last OV. Continue on amlodipine  5 mg daily, carvedilol  25 mg twice daily, spironolactone  25 mg daily, valsartan  160 mg daily.   Several [facility] ED visit (7 total) from 3/23-4/28/2025 with multiple visit for LE edema with no other signs of CHF. Patient was treated and discharged with Lasix . Noted patient being homeless and believe this was the primary reason for her frequent visits. ECHO 07/10/2023 showed EF 60-65%, G1DD, mildly dilated LA, mild MV regurgitation.   Most recent ED visit 4/28 for lower abdominal pain with no acute finding on CT abdomen/pelvis. She was also hypertensive 183/118 and reports HTN medication compliance. No intervention or med changes for BP. Recommended to follow up with PCP and cardiology.   Today, reports improvement in LE edema but still has intermittent swelling with prolonged standing that is relieved with leg elevation in 30 minutes. Reports improvement with Lasix  in the hospital as well but not given full rx. She does not use compression stockings because uncomfortable.  Also reports very brief episode of chest pain about 3 weeks ago that occurred at rest described as sharp,3/10, located on the left side of chest and improved with laying  down and deep breaths. Denies exertional chest pain. Reports compliance with medications. Reports low sodium healthy diet.  Patient is able to walk around the grocery store with no concerns.  She has upcoming PT sessions scheduled. Denies tobacco use/alcohol/drug use   ROS: 10 point review of system has been reviewed and considered negative except ones been listed in the HPI.   Studies Reviewed: SABRA   ECHO 07/10/2023 at [facility]  1. Left ventricular ejection fraction, by estimation, is 60 to 65%. The left ventricle has normal function. The left ventricle has no regional wall motion abnormalities. Left ventricular diastolic parameters are consistent with Grade I diastolic  dysfunction (impaired relaxation).  2. Right ventricular systolic function is normal. The right ventricular size is normal. There is normal pulmonary artery systolic pressure.  3. Left atrial size was mildly dilated.  4. The mitral valve is normal in structure. Mild mitral valve regurgitation. No evidence of mitral stenosis.  5. The aortic valve is normal in structure. Aortic valve regurgitation is not visualized. No aortic stenosis is present.  6. The inferior vena cava is normal in size with greater than 50% respiratory variability, suggesting right atrial pressure of 3 mmHg.   ECHO 09/2021 IMPRESSIONS   1. Left ventricular ejection fraction, by estimation, is 60 to 65%. The left ventricle has normal function. The left ventricle has no regional wall motion abnormalities. Left ventricular diastolic parameters are consistent with Grade I diastolic  dysfunction (impaired relaxation).   2. Right ventricular systolic function is normal. The right ventricular size is normal. There is normal pulmonary  artery systolic pressure.   3. Left atrial size was mildly dilated.   4. The mitral valve is normal in structure. Mild mitral valve  regurgitation. No evidence of mitral stenosis.   5. The aortic valve is normal in structure. Aortic  valve regurgitation is  not visualized. No aortic stenosis is present.   6. The inferior vena cava is normal in size with greater than 50% respiratory variability, suggesting right atrial pressure of 3 mmHg.   Comparison(s): The left ventricular function has improved.   Physical Exam:   VS:  BP 114/74   Pulse 80   Ht 5' (1.524 m)   Wt 164 lb (74.4 kg)   SpO2 99%   BMI 32.03 kg/m    Wt Readings from Last 3 Encounters:  12/07/23 164 lb (74.4 kg)  01/30/23 152 lb 9.6 oz (69.2 kg)  09/26/22 150 lb (68 kg)    GEN: Well nourished, well developed in no acute distress while sitting in chair.  NECK: No JVD; No carotid bruits CARDIAC: RRR, no murmurs, rubs, gallops RESPIRATORY:  Clear to auscultation without rales, wheezing or rhonchi  ABDOMEN: Soft, non-tender, non-distended EXTREMITIES:  No edema; No deformity   ASSESSMENT AND PLAN: .   Heart failure with improved ejection fraction (HFimpEF) (HCC) EF 35-40% in 07/2020 in setting of profound anemia improved to 60-65% in 09/2021 ECHO 07/10/2023 @ [facility] showed EF 60-65%, G1DD, mildly dilated LA, mild MV regurgitation. reports improvement in LE edema but still has intermittent swelling with prolonged standing that is relieved with leg elevation in 30 minutes.  She does not use compression stockings because uncomfortable.   Denies SOB, orthopnea, or PND. Appears Euvolemic on exam.  Order Lasix  20 mg PRN. Discussed if taken lasix  more than 3 times per week  then contact office for follow up BMP labs to assess renal function.  Continue on carvedilol  25 mg twice daily, spironolactone  25 mg daily, valsartan  160 mg daily Encouraged low sodium diet, fluid restriction <2L, and daily weights.  Educated to contact our office for weight gain of 2 lbs overnight or 5 lbs in one week. ED precautions discussed.    Primary hypertension Reports well controlled Home BP: 120/70's BP this OV well controlled today: 114/74 Managed by GDMT as above.   Decreased Amlodipine  to 2.5 mg daily due to LE edema, although SE of swelling is not common at this dose.  Discussed the importance of daily medication compliance.  Encourage physical activity for 150 minutes per week and heart healthy low sodium diet. Discussed limiting sodium intake to < 2 grams daily.     Atypical CP  Reports very brief episode of chest pain about 3 weeks ago that occurred at rest described as sharp,3/10, located on the left side of chest and improved with laying down and deep breaths. Denies exertional chest pain. Less concern for ACS with presentation and EKG w/o ischemic changes.  Patient is not concern about episode since so brief and minor. Discussed contacting office if frequency and intensity increases. Discussed ED precautions.     Dispo: F/u in 3 month with any APP.   Signed, Lorette CINDERELLA Kapur, PA-C

## 2023-12-08 ENCOUNTER — Encounter: Payer: Self-pay | Admitting: Physician Assistant

## 2024-02-07 ENCOUNTER — Other Ambulatory Visit: Payer: Self-pay | Admitting: Physician Assistant

## 2024-02-07 ENCOUNTER — Telehealth: Payer: Self-pay | Admitting: Cardiovascular Disease

## 2024-02-07 MED ORDER — AMLODIPINE BESYLATE 5 MG PO TABS: 2.5000 mg | ORAL_TABLET | Freq: Every day | ORAL | 0 refills | Status: DC

## 2024-02-07 MED ORDER — VALSARTAN 160 MG PO TABS: 160.0000 mg | ORAL_TABLET | Freq: Every day | ORAL | 0 refills | Status: DC

## 2024-02-07 NOTE — Telephone Encounter (Signed)
 Left a message for pt, per pt's request, that I have sent in 90 day refill for Amlodipine  and Valsartan  to [facility].

## 2024-02-07 NOTE — Telephone Encounter (Signed)
*  STAT* If patient is at the pharmacy, call can be transferred to refill team.   1. Which medications need to be refilled? (please list name of each medication and dose if known)   valsartan  (DIOVAN ) 160 MG tablet  amLODipine  (NORVASC ) 5 MG tablet   2. Would you like to learn more about the convenience, safety, & potential cost savings by using the [facility]?   3. Are you open to using the [facility] (Type [facility]. ).  4. Which pharmacy/location (including street and city if local pharmacy) is medication to be sent to?  [facility] - [place], [place] - [address]   5. Do they need a 30 day or 90 day supply?   30 day  Patient stated she is completely out of these medications.  Patient has appointment scheduled on 12/2 with DOROTHA Beauvais, FNP.  Patient wants a call back to confirm prescriptions have been sent and stated can leave voice message.

## 2024-02-13 ENCOUNTER — Telehealth: Payer: Self-pay | Admitting: Physician Assistant

## 2024-02-13 DIAGNOSIS — I10 Essential (primary) hypertension: Secondary | ICD-10-CM

## 2024-02-13 MED ORDER — SPIRONOLACTONE 25 MG PO TABS
25.0000 mg | ORAL_TABLET | Freq: Every day | ORAL | 3 refills | Status: DC
Start: 2024-02-13 — End: 2024-02-16

## 2024-02-13 NOTE — Telephone Encounter (Signed)
*  STAT* If patient is at the pharmacy, call can be transferred to refill team.   1. Which medications need to be refilled? (please list name of each medication and dose if known) spironolactone  (ALDACTONE ) 25 MG tablet   2. Which pharmacy/location (including street and city if local pharmacy) is medication to be sent to? [facility] - [place], [place] - [address] Phone: [phone]  Fax: [phone]       Significant History/Details     3. Do they need a 30 day or 90 day supply? 30 Pt is out of medication

## 2024-02-13 NOTE — Telephone Encounter (Signed)
 Refill sent.

## 2024-02-16 ENCOUNTER — Telehealth: Payer: Self-pay | Admitting: Cardiovascular Disease

## 2024-02-16 DIAGNOSIS — I10 Essential (primary) hypertension: Secondary | ICD-10-CM

## 2024-02-16 MED ORDER — SPIRONOLACTONE 25 MG PO TABS: 25.0000 mg | ORAL_TABLET | Freq: Every day | ORAL | 3 refills | Status: AC

## 2024-02-16 NOTE — Telephone Encounter (Signed)
*  STAT* If patient is at the pharmacy, call can be transferred to refill team.   1. Which medications need to be refilled? (please list name of each medication and dose if known)   spironolactone  (ALDACTONE ) 25 MG tablet   2. Would you like to learn more about the convenience, safety, & potential cost savings by using the [facility]?   3. Are you open to using the [facility] (Type [facility]. ).  4. Which pharmacy/location (including street and city if local pharmacy) is medication to be sent to?  [id number] - [place], [place] - [address]   5. Do they need a 30 day or 90 day supply?   30 day  Patient stated she has been out of this medication for over a week.  Patient stated the prescription was sent to a different pharmacy but she needs the prescription to be sent to [id number] - [place], [place] - [address]  Patient wants a call back confirm prescription has been sent.

## 2024-02-16 NOTE — Telephone Encounter (Signed)
Refill sent to the correct pharmacy

## 2024-02-26 ENCOUNTER — Telehealth: Payer: Self-pay | Admitting: Cardiovascular Disease

## 2024-02-26 ENCOUNTER — Other Ambulatory Visit: Payer: Self-pay | Admitting: Physician Assistant

## 2024-02-26 MED ORDER — CARVEDILOL 25 MG PO TABS: 25.0000 mg | ORAL_TABLET | Freq: Two times a day (BID) | ORAL | 2 refills | Status: AC

## 2024-02-26 NOTE — Telephone Encounter (Signed)
 Pt's medication was sent to pt's pharmacy as requested. Confirmation received.

## 2024-02-26 NOTE — Telephone Encounter (Signed)
*  STAT* If patient is at the pharmacy, call can be transferred to refill team.   1. Which medications need to be refilled? (please list name of each medication and dose if known) carvedilol  (COREG ) 25 MG tablet    2. Would you like to learn more about the convenience, safety, & potential cost savings by using the [facility]?    3. Are you open to using the [facility] (Type [facility].  ).   4. Which pharmacy/location (including street and city if local pharmacy) is medication to be sent to? [facility] - [place], [place] - [address]    5. Do they need a 30 day or 90 day supply? 90 day

## 2024-03-11 NOTE — Progress Notes (Unsigned)
 Cardiology Office Note:    Date:  03/12/2024   ID:  Kelly Guzman, DOB 1976/01/30, MRN [id number]  PCP:  [name]   [facility] Providers Cardiologist:  Ozell Fell, MD     Referring MD: No ref. provider found   Chief complaint: 62-month follow-up     History of Present Illness:   Kelly Guzman is a 48 y.o. 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  Admitted to the hospital with severe anemia, hemoglobin of 3, in April 2022.  LVEF 35-40% at that time, with moderate-severe tricuspid regurg, started on GDMT of Entresto , Coreg , spironolactone .  Could not afford Entresto , took losartan  instead.  Echo September 09, 2021 showed LVEF of 60-65%, no RWMA, G1 DD, normal RV, mildly dilated LA, mild MV regurg, normal AV, RA pressure 3 mmHg.  Hemoglobin at that time was 11.  At follow-up visit in 2024, BP remained uncontrolled.  Amlodipine  was started, losartan  was changed to valsartan  for tighter BP control.  Medication noncompliance was reported on multiple visits.  Was seen at [facility] 7 times from March 23 - August 07, 2023 for multiple visits regarding lower extremity edema and no other signs of CHF.  It was noted that the patient was having trouble with homelessness, which could be a contributor to her frequent visits at that time.    Most recently seen by Lorette Kapur, PA in cardiology office on 12/07/2023, with continued lower extremity edema and atypical chest pain that did not appear to be ischemic in nature.  Lasix  was ordered as needed, amlodipine  was decreased to 2.5 mg daily.    Presents independently, appears stable from a cardiovascular standpoint.  Describes a sharp substernal chest pain, coming and going since the beginning of August.  Occurring at rest, yesterday occurred upon  waking, and with activities.  Currently present in office at time of EKG, no acute changes visualized on EKG.  Will occasionally feel as though she is having a hot flash with this chest pain.  Denies associated shortness of breath, palpitations, nausea.  Increased stress, getting into arguments, can exacerbate the pain.  Slow deep breathing relieves the pain as well as positional change at times.  Episodes of pain lasting 3-5 minutes at a time. Was going to physical therapy once-twice per week, has not gone as frequently over the last month due to lack of time.  Reports feeling increased fatigue and decreased exercise tolerance. She denies palpitations, dyspnea, orthopnea, new/recent cough, n, v, dark/tarry/bloody stools, hematuria, dizziness, syncope, edema, unilateral leg swelling, weight gain.  Reports the swelling in her legs remains unchanged since decreasing her dose of amlodipine  at prior visit.  States she occasionally forgets to take her blood pressure medication, and recently reports having run out of a few.  Reports blood pressures typically averaging between 127-147 systolic over 78-98 diastolic.   ROS:   Please see the history of present illness.    All other systems reviewed and are negative.     Past Medical History:  Diagnosis Date   Anemia    Heart failure (HCC)    Hypertension     Past Surgical History:  Procedure Laterality Date   CESAREAN SECTION     gave birth to her daughter around 2002    Current Medications: No outpatient medications have been  marked as taking for the 03/12/24 encounter (Office Visit) with Colton Engdahl E, NP.     Allergies:   Latex, Penicillins, and Tape   Social History   Socioeconomic History   Marital status: Single    Spouse name: Not on file   Number of children: 1   Years of education: Not on file   Highest education level: Not on file  Occupational History   Not on file  Tobacco Use   Smoking status: Never   Smokeless  tobacco: Never  Vaping Use   Vaping status: Never Used  Substance and Sexual Activity   Alcohol use: Never   Drug use: Never   Sexual activity: Not on file  Other Topics Concern   Not on file  Social History Narrative   Not on file   Social Drivers of Health   Financial Resource Strain: High Risk (08/10/2022)   Overall Financial Resource Strain (CARDIA)    Difficulty of Paying Living Expenses: Very hard  Food Insecurity: Food Insecurity Present (08/10/2022)   Hunger Vital Sign    Worried About Running Out of Food in the Last Year: Sometimes true    Ran Out of Food in the Last Year: Sometimes true  Transportation Needs: Unmet Transportation Needs (08/10/2022)   PRAPARE - Administrator, Civil Service (Medical): No    Lack of Transportation (Non-Medical): Yes  Physical Activity: Not on file  Stress: Not on file  Social Connections: Not on file     Family History: The patient's family history includes Diabetes in her father and mother; Heart failure in her father and paternal grandfather; Hypertension in her mother. There is no history of BRCA 1/2 or Breast cancer.  EKGs/Labs/Other Studies Reviewed:    The following studies were reviewed today:  EKG Interpretation Date/Time:  Tuesday March 12 2024 13:53:53 EST Ventricular Rate:  71 PR Interval:  200 QRS Duration:  76 QT Interval:  394 QTC Calculation: 428 R Axis:   62  Text Interpretation: Normal sinus rhythm Normal ECG TWI in lead III present on prior studies No significant changes from prior studies Confirmed by Almeta Geisel [id number]) on 03/12/2024 4:14:20 PM    Recent Labs: No results found for requested labs within last 365 days.  Recent Lipid Panel    Component Value Date/Time   CHOL 151 01/07/2022 1212   TRIG 95 01/07/2022 1212   HDL 51 01/07/2022 1212   CHOLHDL 3.0 01/07/2022 1212   LDLCALC 81 01/07/2022 1212    Physical Exam:    VS:  BP (!) 155/88   Pulse 79   Ht 5' (1.524 m)   Wt 158 lb  (71.7 kg)   SpO2 97%   BMI 30.86 kg/m        Wt Readings from Last 3 Encounters:  03/12/24 158 lb (71.7 kg)  12/07/23 164 lb (74.4 kg)  01/30/23 152 lb 9.6 oz (69.2 kg)     GEN:  Well nourished, well developed in no acute distress HEENT: Normal NECK:  No carotid bruits CARDIAC:  S1-S2 normal, RRR, no murmurs, rubs, gallops RESPIRATORY:  Clear to auscultation without rales, wheezing or rhonchi  MUSCULOSKELETAL:  No edema; No deformity  SKIN: Warm and dry NEUROLOGIC:  Alert and oriented x 3 PSYCHIATRIC:  Normal affect       Assessment & Plan Atypical chest pain Fatigue, unspecified type The chest pain has mixed features and appears atypical in nature.  Currently does not appear ischemic at this time given  it occurs at rest, can be relieved with slow deep breathing and positional change.  Reported chest pain during this visit, obtained an EKG during this that appeared at baseline with no acute changes from prior studies. Given her fatigue and decreased exercise capacity, will order to evaluate ventricular function, valvular pathology, and evidence of elevated filling pressures or pulmonary hypertension contributing to fatigue and exercise intolerance. Believe uncontrolled hypertension could be a contributing factor to her symptoms, will focus on HTN control with close follow-up going forward until echo can be obtained. ED precautions given. Will check CBC to rule out anemia as cause Will check BMP to rule out any electrolyte/kidney abnormalities. Heart failure with improved ejection fraction (HFimpEF) (HCC) Mitral valve insufficiency, unspecified etiology 07/2020 LVEF 35-40% in the setting of severe anemia 09/2021 LVEF 60-65%, no RWMA, G1 DD Reports increased fatigue Denies CP, SOB, palpitations, near-syncope Down 6 pounds from prior visit Euvolemic on exam Continue valsartan  160 mg daily Continue spironolactone  25 mg daily Continue carvedilol  25 mg twice daily Continue Lasix   20 mg daily Ordering echo for further monitoring as above Primary hypertension BP range: 128-147/78-97 on readings at home Reports lower extremity edema unchanged from prior visit following decrease in amlodipine  dosage No edema observed on exam today Will increase amlodipine  back to 5 mg per patient request as she states she tolerated this well. Will have patient keep BP log over next 2 weeks, with close follow-up for further BP medication titration. Continue valsartan  160 mg daily Continue spironolactone  25 mg daily Continue carvedilol  25 mg twice daily Continue Lasix  20 mg daily Increase amlodipine  to 5 mg daily  Follow-up in 2-4 weeks with any available APP Proceed to ED with new, concerning, worsening symptoms.           Medication Adjustments/Labs and Tests Ordered: Current medicines are reviewed at length with the patient today.  Concerns regarding medicines are outlined above.  Orders Placed This Encounter  Procedures   Basic Metabolic Panel (BMET)   CBC   EKG 12-Lead   EKG 12-Lead   ECHOCARDIOGRAM COMPLETE   Meds ordered this encounter  Medications   amLODipine  (NORVASC ) 5 MG tablet    Sig: Take 1 tablet (5 mg total) by mouth daily.    Dispense:  90 tablet    Refill:  3    Patient Instructions  Medication Instructions:   START TAKING:   AMLODIPINE   5 MG  ONCE A DAY     *If you need a refill on your cardiac medications before your next appointment, please call your pharmacy*   Lab Work:  PLEASE GO DOWN STAIRS  [facility]  FIRST FLOOR   ( GET OFF ELEVATORS WALK TOWARDS WAITING AREA LAB LOCATED BY PHARMACY):  BMET AND CBC  TODAY       If you have labs (blood work) drawn today and your tests are completely normal, you will receive your results only by: MyChart Message (if you have MyChart) OR A paper copy in the mail If you have any lab test that is abnormal or we need to change your treatment, we will call you to review the  results.    Testing/Procedures: Your physician has requested that you have an echocardiogram. Echocardiography is a painless test that uses sound waves to create images of your heart. It provides your doctor with information about the size and shape of your heart and how well your heart's chambers and valves are working. This procedure takes approximately one hour. There are  no restrictions for this procedure. Please do NOT wear cologne, perfume, aftershave, or lotions (deodorant is allowed). Please arrive 15 minutes prior to your appointment time.  Please note: We ask at that you not bring children with you during ultrasound (echo/ vascular) testing. Due to room size and safety concerns, children are not allowed in the ultrasound rooms during exams. Our front office staff cannot provide observation of children in our lobby area while testing is being conducted. An adult accompanying a patient to their appointment will only be allowed in the ultrasound room at the discretion of the ultrasound technician under special circumstances. We apologize for any inconvenience.      Follow-Up: At [facility], you and your health needs are our priority.  As part of our continuing mission to provide you with exceptional heart care, our providers are all part of one team.  This team includes your primary Cardiologist (physician) and Advanced Practice Providers or APPs (Physician Assistants and Nurse Practitioners) who all work together to provide you with the care you need, when you need it.  Your next appointment:    2 -4  week(s)  Provider:    ANY  AVAILABLE  APP     We recommend signing up for the patient portal called MyChart.  Sign up information is provided on this After Visit Summary.  MyChart is used to connect with patients for Virtual Visits (Telemedicine).  Patients are able to view lab/test results, encounter notes, upcoming appointments, etc.  Non-urgent messages can be sent to your  provider as well.   To learn more about what you can do with MyChart, go to [email or web address].    Other Instructions             Signed, Miriam FORBES Shams, NP  03/12/2024 4:24 PM    [facility]

## 2024-03-12 ENCOUNTER — Ambulatory Visit: Attending: General Practice | Admitting: Emergency Medicine

## 2024-03-12 ENCOUNTER — Encounter: Payer: Self-pay | Admitting: Emergency Medicine

## 2024-03-12 VITALS — BP 155/88 | HR 79 | Ht 60.0 in | Wt 158.0 lb

## 2024-03-12 DIAGNOSIS — I340 Nonrheumatic mitral (valve) insufficiency: Secondary | ICD-10-CM | POA: Diagnosis present

## 2024-03-12 DIAGNOSIS — R0789 Other chest pain: Secondary | ICD-10-CM | POA: Diagnosis not present

## 2024-03-12 DIAGNOSIS — I10 Essential (primary) hypertension: Secondary | ICD-10-CM | POA: Diagnosis present

## 2024-03-12 DIAGNOSIS — I5020 Unspecified systolic (congestive) heart failure: Secondary | ICD-10-CM | POA: Diagnosis present

## 2024-03-12 DIAGNOSIS — R5383 Other fatigue: Secondary | ICD-10-CM | POA: Diagnosis not present

## 2024-03-12 MED ORDER — AMLODIPINE BESYLATE 5 MG PO TABS: 5.0000 mg | ORAL_TABLET | Freq: Every day | ORAL | 3 refills | Status: AC

## 2024-03-12 NOTE — Patient Instructions (Addendum)
 Medication Instructions:   START TAKING:   AMLODIPINE   5 MG  ONCE A DAY     *If you need a refill on your cardiac medications before your next appointment, please call your pharmacy*   Lab Work:  PLEASE GO DOWN STAIRS  [facility]  FIRST FLOOR   ( GET OFF ELEVATORS WALK TOWARDS WAITING AREA LAB LOCATED BY PHARMACY):  BMET AND CBC  TODAY       If you have labs (blood work) drawn today and your tests are completely normal, you will receive your results only by: MyChart Message (if you have MyChart) OR A paper copy in the mail If you have any lab test that is abnormal or we need to change your treatment, we will call you to review the results.    Testing/Procedures: Your physician has requested that you have an echocardiogram. Echocardiography is a painless test that uses sound waves to create images of your heart. It provides your doctor with information about the size and shape of your heart and how well your heart's chambers and valves are working. This procedure takes approximately one hour. There are no restrictions for this procedure. Please do NOT wear cologne, perfume, aftershave, or lotions (deodorant is allowed). Please arrive 15 minutes prior to your appointment time.  Please note: We ask at that you not bring children with you during ultrasound (echo/ vascular) testing. Due to room size and safety concerns, children are not allowed in the ultrasound rooms during exams. Our front office staff cannot provide observation of children in our lobby area while testing is being conducted. An adult accompanying a patient to their appointment will only be allowed in the ultrasound room at the discretion of the ultrasound technician under special circumstances. We apologize for any inconvenience.      Follow-Up: At [facility], you and your health needs are our priority.  As part of our continuing mission to provide you with exceptional heart care, our providers are all part of one  team.  This team includes your primary Cardiologist (physician) and Advanced Practice Providers or APPs (Physician Assistants and Nurse Practitioners) who all work together to provide you with the care you need, when you need it.  Your next appointment:    2 -4  week(s)  Provider:    ANY  AVAILABLE  APP     We recommend signing up for the patient portal called MyChart.  Sign up information is provided on this After Visit Summary.  MyChart is used to connect with patients for Virtual Visits (Telemedicine).  Patients are able to view lab/test results, encounter notes, upcoming appointments, etc.  Non-urgent messages can be sent to your provider as well.   To learn more about what you can do with MyChart, go to [email or web address].    Other Instructions

## 2024-03-13 LAB — CBC
Hematocrit: 38.3 % (ref 34.0–46.6)
Hemoglobin: 12.5 g/dL (ref 11.1–15.9)
MCH: 26.4 pg — ABNORMAL LOW (ref 26.6–33.0)
MCHC: 32.6 g/dL (ref 31.5–35.7)
MCV: 81 fL (ref 79–97)
Platelets: 403 x10E3/uL (ref 150–450)
RBC: 4.73 x10E6/uL (ref 3.77–5.28)
RDW: 16.6 % — ABNORMAL HIGH (ref 11.7–15.4)
WBC: 10.1 x10E3/uL (ref 3.4–10.8)

## 2024-03-13 LAB — BASIC METABOLIC PANEL WITH GFR
BUN/Creatinine Ratio: 14 (ref 9–23)
BUN: 9 mg/dL (ref 6–24)
CO2: 19 mmol/L — AB (ref 20–29)
Calcium: 9.7 mg/dL (ref 8.7–10.2)
Chloride: 101 mmol/L (ref 96–106)
Creatinine, Ser: 0.66 mg/dL (ref 0.57–1.00)
Glucose: 98 mg/dL (ref 70–99)
Potassium: 3.9 mmol/L (ref 3.5–5.2)
Sodium: 140 mmol/L (ref 134–144)
eGFR: 108 mL/min/1.73 (ref >59)

## 2024-03-15 ENCOUNTER — Ambulatory Visit: Payer: Self-pay | Admitting: Emergency Medicine

## 2024-03-21 ENCOUNTER — Telehealth: Payer: Self-pay | Admitting: Cardiovascular Disease

## 2024-03-21 NOTE — Telephone Encounter (Signed)
 Pt wants information on how to get her medication copays waived. Please advise.

## 2024-03-22 NOTE — Telephone Encounter (Signed)
 Left message to call back.

## 2024-03-27 NOTE — Telephone Encounter (Signed)
 Left message for pt to call.

## 2024-03-27 NOTE — Telephone Encounter (Signed)
 Pt requesting a c/b from the nurse

## 2024-03-27 NOTE — Telephone Encounter (Signed)
 Per Maccia, Melissa D, RPH: There isnt any way to get the copays from her medicaid waived, but [facility], [facility] will allow her to create an account and if she doesn't have the money at the time she needs the rx, then she can charge to account and pay later.  Explained to patient that we are unable to waive co-pays. Pt states she has been getting her medications from the [facility] but are no longer able to deliver for free. Pt lives in [place]. Pt given [facility] number to discuss further details with them and answer any questions about delivery. Pt verbalized understanding of plan.

## 2024-04-01 ENCOUNTER — Other Ambulatory Visit: Payer: Self-pay | Admitting: Physician Assistant

## 2024-04-02 ENCOUNTER — Ambulatory Visit (HOSPITAL_BASED_OUTPATIENT_CLINIC_OR_DEPARTMENT_OTHER)

## 2024-04-10 ENCOUNTER — Ambulatory Visit (HOSPITAL_BASED_OUTPATIENT_CLINIC_OR_DEPARTMENT_OTHER)
Admission: RE | Admit: 2024-04-10 | Discharge: 2024-04-10 | Disposition: A | Discharge status: Home / Self Care | Source: Ambulatory Visit | Attending: Emergency Medicine | Admitting: Emergency Medicine

## 2024-04-10 DIAGNOSIS — R0789 Other chest pain: Secondary | ICD-10-CM | POA: Insufficient documentation

## 2024-04-10 DIAGNOSIS — R5383 Other fatigue: Secondary | ICD-10-CM | POA: Diagnosis not present

## 2024-04-10 NOTE — Progress Notes (Deleted)
 " Cardiology Office Note   Date:  04/10/2024  ID:  Kelly Guzman, DOB 1975/05/29, MRN [id number] PCP: [name]  [facility] Providers Cardiologist:  Ozell Fell, MD {  History of Present Illness Kelly Guzman is a 48 y.o. 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  History includes admitted to the hospital with severe anemia with a hemoglobin of 12 July 2020.  LVEF 35 to 40% at that time with moderate severe tricuspid regurgitation and started on GDMT with Entresto , Coreg , and spironolactone .  Could not afford Entresto  and took losartan  instead.  Echo September 09, 2021 showed LVEF 65%, no RWMA, G1 DD, normal RV, mildly dilated LA, mild MV regurgitation, normal AV, RA pressure is 3 mmHg.  Hemoglobin at that time was 11.  Follow-up visit in 2024 with BP remaining uncontrolled.  Amlodipine  was started and losartan  was changed to valsartan  for tighter BP control.  Medication noncompliance was reported on several visits.  Was seen at [facility] 7 times from March 23 to August 07, 2023 multiple visits regarding lower extremity edema and no other signs of CHF.  Was noted that the patient was having trouble with homelessness which could be a contributing factor to her frequent visits.  Was seen by Lorette Kapur, PA in cardiology office 12/07/2023 with continued lower extremity edema and atypical chest pain that did not appear to be ischemic in nature.  Lasix  was ordered as needed and amlodipine  was decreased to 2.5 mg daily.  She was seen more recently by Miriam Shams, NP on 03/12/2024 and at that time was describing a sharp substernal chest pain coming and going since beginning of August.  It was occurring at rest and occurred upon waking with activities.  EKG showed no acute changes.  Occasionally felt as if she  was having a hot flash with chest pain.  Also it increases with stress when she is getting into arguments and can exacerbate the pain.  Slow deep breathing relieves the pain as well as positional changes at times.  Episodes of pain last about 3 to 5 minutes at a time.  She was going to physical therapy once or twice a week but has not gone over the last month due to lack of time.  She also reported an increase in fatigue and decreased exercise tolerance.  Denied palpitations, dyspnea, orthopnea, or new recent cough.  No dark tarry stools, hematochezia, dizziness, syncope, edema, unilateral leg swelling or weight gain.  Reported that the swelling in her legs remain unchanged since decreasing her dose of amlodipine  at prior visit.  She also stated that she occasionally forgets to take her blood pressure pills and has run out a few times.  Typically blood pressures average between 127 and 147 systolic over 98 on the high end.  An echocardiogram has been ordered but has not been done.  Today, she***  ROS: ***  Studies Reviewed      *** Risk Assessment/Calculations {Does this patient have ATRIAL FIBRILLATION?:[phone]} No BP recorded.  {Refresh Note OR Click here to enter BP  :1}***       Physical Exam VS:  There were no vitals taken for this visit.       Wt Readings from Last 3 Encounters:  03/12/24 158 lb (71.7 kg)  12/07/23 164 lb (74.4  kg)  01/30/23 152 lb 9.6 oz (69.2 kg)    GEN: Well nourished, well developed in no acute distress NECK: No JVD; No carotid bruits CARDIAC: ***RRR, no murmurs, rubs, gallops RESPIRATORY:  Clear to auscultation without rales, wheezing or rhonchi  ABDOMEN: Soft, non-tender, non-distended EXTREMITIES:  No edema; No deformity   ASSESSMENT AND PLAN Atypical chest pain/fatigue Heart failure with improved ejection fraction/mitral valve insufficiency Hypertension    {Are you ordering a CV Procedure (e.g. stress test, cath, DCCV, TEE, etc)?   Press F2         :[id number]}  Dispo: ***  Signed, Orren LOISE Fabry, PA-C   "

## 2024-04-11 ENCOUNTER — Encounter: Payer: Self-pay | Admitting: Hematology and Oncology

## 2024-04-11 LAB — ECHOCARDIOGRAM COMPLETE
AR max vel: 2.29 cm2
AV Area VTI: 2.29 cm2
AV Area mean vel: 2.33 cm2
AV Mean grad: 3.5 mmHg
AV Peak grad: 6.6 mmHg
Ao pk vel: 1.29 m/s
Area-P 1/2: 3.74 cm2
Calc EF: 61 %
MV M vel: 4.6 m/s
MV Peak grad: 84.6 mmHg
S' Lateral: 2.4 cm
Single Plane A2C EF: 60 %
Single Plane A4C EF: 60.5 %

## 2024-04-12 ENCOUNTER — Ambulatory Visit: Admitting: Physician Assistant

## 2024-04-12 DIAGNOSIS — Z79899 Other long term (current) drug therapy: Secondary | ICD-10-CM

## 2024-04-12 DIAGNOSIS — I340 Nonrheumatic mitral (valve) insufficiency: Secondary | ICD-10-CM

## 2024-04-12 DIAGNOSIS — R5383 Other fatigue: Secondary | ICD-10-CM

## 2024-04-12 DIAGNOSIS — R0789 Other chest pain: Secondary | ICD-10-CM

## 2024-04-12 DIAGNOSIS — I5020 Unspecified systolic (congestive) heart failure: Secondary | ICD-10-CM

## 2024-04-12 DIAGNOSIS — I10 Essential (primary) hypertension: Secondary | ICD-10-CM

## 2024-05-01 ENCOUNTER — Telehealth: Payer: Self-pay | Admitting: Cardiovascular Disease

## 2024-05-01 NOTE — Telephone Encounter (Signed)
 Pt requesting call to go over Echo results. Please advise.

## 2024-05-03 NOTE — Telephone Encounter (Signed)
 Completed call discussing recent results with patient. She was advised to follow up with her PCP regarding questions about testing for diabetes. All questions answered.

## 2024-06-21 ENCOUNTER — Ambulatory Visit: Admitting: Physician Assistant
